# Patient Record
Sex: Female | Born: 1945
Health system: Southern US, Community
[De-identification: ages and names within clinical notes are randomized; demographics above are authoritative.]

## PROBLEM LIST (undated history)

## (undated) DIAGNOSIS — M1990 Unspecified osteoarthritis, unspecified site: Secondary | ICD-10-CM

## (undated) DIAGNOSIS — E7800 Pure hypercholesterolemia, unspecified: Secondary | ICD-10-CM

## (undated) DIAGNOSIS — J45909 Unspecified asthma, uncomplicated: Secondary | ICD-10-CM

## (undated) HISTORY — PX: BACK SURGERY: SHX140

## (undated) HISTORY — PX: ABDOMINAL HYSTERECTOMY: SHX81

---

## 1998-07-24 ENCOUNTER — Encounter: Payer: Self-pay | Admitting: Endocrinology

## 1998-07-24 ENCOUNTER — Ambulatory Visit (HOSPITAL_COMMUNITY): Admission: RE | Admit: 1998-07-24 | Discharge: 1998-07-24 | Payer: Self-pay | Admitting: Endocrinology

## 1999-07-30 ENCOUNTER — Encounter: Payer: Self-pay | Admitting: Endocrinology

## 1999-07-30 ENCOUNTER — Ambulatory Visit (HOSPITAL_COMMUNITY): Admission: RE | Admit: 1999-07-30 | Discharge: 1999-07-30 | Payer: Self-pay | Admitting: Endocrinology

## 2009-12-23 ENCOUNTER — Encounter (INDEPENDENT_AMBULATORY_CARE_PROVIDER_SITE_OTHER): Payer: Self-pay | Admitting: *Deleted

## 2009-12-24 ENCOUNTER — Encounter (INDEPENDENT_AMBULATORY_CARE_PROVIDER_SITE_OTHER): Payer: Self-pay

## 2009-12-25 ENCOUNTER — Encounter: Admission: RE | Admit: 2009-12-25 | Discharge: 2009-12-25 | Payer: Self-pay | Admitting: Internal Medicine

## 2009-12-29 ENCOUNTER — Ambulatory Visit: Payer: Self-pay | Admitting: Internal Medicine

## 2010-01-06 ENCOUNTER — Ambulatory Visit: Payer: Self-pay | Admitting: Internal Medicine

## 2010-09-07 NOTE — Letter (Signed)
Summary: Previsit letter  [facility]  [address] [place], [place] [address]   Phone: [phone]  Fax: [phone]       12/23/2009 MRN: [id number]  [facility] [place] [address] [place], [place]  [address]  Dear Ms. Hesch,  Welcome to the Gastroenterology Division at [facility].    You are scheduled to see a nurse for your pre-procedure visit on 12-29-09 at 10:00a.m. on the 3rd floor at [facility], [address]  We ask that you try to arrive at our office 15 minutes prior to your appointment time to allow for check-in.  Your nurse visit will consist of discussing your medical and surgical history, your immediate family medical history, and your medications.    Please bring a complete list of all your medications or, if you prefer, bring the medication bottles and we will list them.  We will need to be aware of both prescribed and over the counter drugs.  We will need to know exact dosage information as well.  If you are on blood thinners (Coumadin, Plavix, Aggrenox, Ticlid, etc.) please call our office today/prior to your appointment, as we need to consult with your physician about holding your medication.   Please be prepared to read and sign documents such as consent forms, a financial agreement, and acknowledgement forms.  If necessary, and with your consent, a friend or relative is welcome to sit-in on the nurse visit with you.  Please bring your insurance card so that we may make a copy of it.  If your insurance requires a referral to see a specialist, please bring your referral form from your primary care physician.  No co-pay is required for this nurse visit.     If you cannot keep your appointment, please call [phone] to cancel or reschedule prior to your appointment date.  This allows [place] the opportunity to schedule an appointment for another patient in need of care.    Thank you for choosing [facility] for your  medical needs.  We appreciate the opportunity to care for you.  Please visit [place] at our website  to learn more about our practice.                     Sincerely.                                                                                                                   The Gastroenterology Division

## 2010-09-07 NOTE — Miscellaneous (Signed)
Summary: direct colon--ch.  Clinical Lists Changes  Medications: Added new medication of MIRALAX   POWD (POLYETHYLENE GLYCOL 3350) As per prep  instructions. - Signed Added new medication of REGLAN 10 MG  TABS (METOCLOPRAMIDE HCL) As per prep instructions. - Signed Added new medication of DULCOLAX 5 MG  TBEC (BISACODYL) Day before procedure take 2 at 3pm and 2 at 8pm. - Signed Rx of MIRALAX   POWD (POLYETHYLENE GLYCOL 3350) As per prep  instructions.;  #255gm x 0;  Signed;  Entered by: Ulis Rias RN;  Authorized by: Hart Carwin MD;  Method used: Electronically to [facility] [id number]*, [address], [place], [place], [place]  [address], Ph: [phone], Fax: [phone] Rx of REGLAN 10 MG  TABS (METOCLOPRAMIDE HCL) As per prep instructions.;  #2 x 0;  Signed;  Entered by: Ulis Rias RN;  Authorized by: Hart Carwin MD;  Method used: Electronically to [facility] [id number]*, [address] [place], [place], [place]  [address], Ph: [phone], Fax: [phone] Rx of DULCOLAX 5 MG  TBEC (BISACODYL) Day before procedure take 2 at 3pm and 2 at 8pm.;  #4 x 0;  Signed;  Entered by: Ulis Rias RN;  Authorized by: Hart Carwin MD;  Method used: Electronically to [facility] [address][id number]*, [address], [place], [place], [place]  [address], Ph: [phone], Fax: [phone] Allergies: Added new allergy or adverse reaction of * ASPIRIN PRODUCTS Observations: Added new observation of NKA: F (12/29/2009 9:25)    Prescriptions: DULCOLAX 5 MG  TBEC (BISACODYL) Day before procedure take 2 at 3pm and 2 at 8pm.  #4 x 0   Entered by:   Ulis Rias RN   Authorized by:   Hart Carwin MD   Signed by:   Ulis Rias RN on 12/29/2009   Method used:   Electronically to        [facility] [id number]* (retail)       [address] [place]       [place], [place]  [address]       Ph:  [phone]       Fax: [phone]   RxID:   [id number] REGLAN 10 MG  TABS (METOCLOPRAMIDE HCL) As per prep instructions.  #2 x 0   Entered by:   Ulis Rias RN   Authorized by:   Hart Carwin MD   Signed by:   Ulis Rias RN on 12/29/2009   Method used:   Electronically to        [facility] [address]38* (retail)       [address] [place]       [place], [place]  [address]       Ph: [phone]       Fax: [phone]   RxID:   [id number] MIRALAX   POWD (POLYETHYLENE GLYCOL 3350) As per prep  instructions.  #255gm x 0   Entered by:   Ulis Rias RN   Authorized by:   Hart Carwin MD   Signed by:   Ulis Rias RN on 12/29/2009   Method used:   Electronically to        [facility] [address]* (retail)       [address]       [place]  [place], [place]  [address]       Ph: [phone]       Fax: [phone]   RxID:   [id number]

## 2010-09-07 NOTE — Procedures (Signed)
Summary: Colonoscopy  Patient: [name] Note: All result statuses are Final unless otherwise noted.  Tests: (1) Colonoscopy (COL)   COL Colonoscopy           DONE     [facility]     [address]     [place], [place]  [address]           COLONOSCOPY PROCEDURE REPORT           PATIENT:  Kristina Burns, Kristina Burns  MR#:  [id number]     BIRTHDATE:  1945-10-24, 63 yrs. old  GENDER:  female     ENDOSCOPIST:  Hedwig Morton. Juanda Chance, MD     REF. BY:  Guerry Bruin, M.D.     PROCEDURE DATE:  01/06/2010     PROCEDURE:  Colonoscopy 08657     ASA CLASS:  Class I     INDICATIONS:  Routine Risk Screening     MEDICATIONS:   Versed 7 mg, Fentanyl 75 mcg           DESCRIPTION OF PROCEDURE:   After the risks benefits and     alternatives of the procedure were thoroughly explained, informed     consent was obtained.  Digital rectal exam was performed and     revealed no rectal masses.   The LB CF-[id number] endoscope     was introduced through the anus and advanced to the cecum, which     was identified by both the appendix and ileocecal valve, without     limitations.  The quality of the prep was good, using MiraLax.     The instrument was then slowly withdrawn as the colon was fully     examined.     <<PROCEDUREIMAGES>>           FINDINGS:  Mild diverticulosis was found in the cecum (see     image2). several diverticuli in the cecal pouch  This was     otherwise a normal examination of the colon (see image1, image3,     image4, image5, and image6).   Retroflexed views in the rectum     revealed no abnormalities.    The scope was then withdrawn from     the patient and the procedure completed.           COMPLICATIONS:  None     ENDOSCOPIC IMPRESSION:     1) Mild diverticulosis in the cecum     2) Otherwise normal examination     RECOMMENDATIONS:     1) high fiber diet     REPEAT EXAM:  In 10 year(s) for.           ______________________________     Hedwig Morton. Juanda Chance, MD        CC:           n.     eSIGNED:   Hedwig Morton. Maziyah Vessel at 01/06/2010 11:53 AM           Sroka, Alen Blew, [id number]  Note: An exclamation mark (!) indicates a result that was not dispersed into the flowsheet. Document Creation Date: 01/06/2010 11:54 AM _______________________________________________________________________  (1) Order result status: Final Collection or observation date-time: 01/06/2010 11:48 Requested date-time:  Receipt date-time:  Reported date-time:  Referring Physician:   Ordering Physician: Lina Sar [phone]) Specimen Source:  Source: Launa Grill Order Number: [id number] Lab site:   Appended Document: Colonoscopy    Clinical Lists Changes  Observations: Added new observation of COLONNXTDUE: 01/2020 (01/06/2010 14:28)

## 2010-09-07 NOTE — Letter (Signed)
Summary: Miralax Instructions  [facility]  [address]   [place], [place] [address]   Phone: [phone]  Fax: [phone]       Kristina Burns    Jan 09, 1946    MRN: [id number]       Procedure Day /Date: Wednesday, 01-06-10     Arrival Time: 10:30 a.m.     Procedure Time: 11:30 a.m.     Location of Procedure:                    x   [facility] (4th Floor)    PREPARATION FOR COLONOSCOPY WITH MIRALAX  Starting 5 days prior to your procedure 01-01-10  do not eat nuts, seeds, popcorn, corn, beans, peas,  salads, or any raw vegetables.  Do not take any fiber supplements (e.g. Metamucil, Citrucel, and Benefiber). ____________________________________________________________________________________________________   THE DAY BEFORE YOUR PROCEDURE         DATE:  01-05-10  DAY: Tuesday  1   Drink clear liquids the entire day-NO SOLID FOOD  2   Do not drink anything colored red or purple.  Avoid juices with pulp.  No orange juice.  3   Drink at least 64 oz. (8 glasses) of fluid/clear liquids during the day to prevent dehydration and help the prep work efficiently.  CLEAR LIQUIDS INCLUDE: Water Jello Ice Popsicles Tea (sugar ok, no milk/cream) Powdered fruit flavored drinks Coffee (sugar ok, no milk/cream) Gatorade Juice: apple, white grape, white cranberry  Lemonade Clear bullion, consomm, broth Carbonated beverages (any kind) Strained chicken noodle soup Hard Candy  4   Mix the entire bottle of Miralax with 64 oz. of Gatorade/Powerade in the morning and put in the refrigerator to chill.  5   At 3:00 pm take 2 Dulcolax/Bisacodyl tablets.  6   At 4:30 pm take one Reglan/Metoclopramide tablet.  7  Starting at 5:00 pm drink one 8 oz glass of the Miralax mixture every 15-20 minutes until you have finished drinking the entire 64 oz.  You should finish drinking prep around 7:30 or 8:00 pm.  8   If you are nauseated, you may take the 2nd  Reglan/Metoclopramide tablet at 6:30 pm.        9    At 8:00 pm take 2 more DULCOLAX/Bisacodyl tablets.     THE DAY OF YOUR PROCEDURE      DATE:  01-06-10   DAY: Wednesday  You may drink clear liquids until  9:30 a.m.  (2 HOURS BEFORE PROCEDURE).   MEDICATION INSTRUCTIONS  Unless otherwise instructed, you should take regular prescription medications with a small sip of water as early as possible the morning of your procedure.         OTHER INSTRUCTIONS  You will need a responsible adult at least 63 years of age to accompany you and drive you home.   This person must remain in the waiting room during your procedure.  Wear loose fitting clothing that is easily removed.  Leave jewelry and other valuables at home.  However, you may wish to bring a book to read or an iPod/MP3 player to listen to music as you wait for your procedure to start.  Remove all body piercing jewelry and leave at home.  Total time from sign-in until discharge is approximately 2-3 hours.  You should go home directly after your procedure and rest.  You can resume normal activities the day after your procedure.  The day of your procedure you should not:  Drive   Make legal decisions   Operate machinery   Drink alcohol   Return to work  You will receive specific instructions about eating, activities and medications before you leave.   The above instructions have been reviewed and explained to me by   Ulis Rias RN  Dec 29, 2009 10:07 AM    I fully understand and can verbalize these instructions _____________________________ Date _______

## 2012-12-18 ENCOUNTER — Other Ambulatory Visit: Payer: Self-pay | Admitting: Internal Medicine

## 2012-12-18 DIAGNOSIS — Z1231 Encounter for screening mammogram for malignant neoplasm of breast: Secondary | ICD-10-CM

## 2013-01-04 ENCOUNTER — Ambulatory Visit
Admission: RE | Admit: 2013-01-04 | Discharge: 2013-01-04 | Disposition: A | Discharge status: Home / Self Care | Payer: PRIVATE HEALTH INSURANCE | Source: Ambulatory Visit | Attending: Internal Medicine | Admitting: Internal Medicine

## 2013-01-04 DIAGNOSIS — Z1231 Encounter for screening mammogram for malignant neoplasm of breast: Secondary | ICD-10-CM

## 2013-10-24 ENCOUNTER — Other Ambulatory Visit (HOSPITAL_COMMUNITY): Payer: Self-pay | Admitting: Internal Medicine

## 2013-10-24 DIAGNOSIS — J45909 Unspecified asthma, uncomplicated: Secondary | ICD-10-CM

## 2013-10-24 LAB — PULMONARY FUNCTION TEST
DL/VA % pred: 108 %
DL/VA: 4.91 ml/min/mmHg/L
DLCO unc % pred: 73 %
DLCO unc: 15.76 ml/min/mmHg
FEF 25-75 Post: 0.82 L/sec
FEF 25-75 Pre: 0.56 L/sec
FEF2575-%Change-Post: 46 %
FEF2575-%Pred-Post: 41 %
FEF2575-%Pred-Pre: 28 %
FEV1-%Change-Post: 12 %
FEV1-%Pred-Post: 60 %
FEV1-%Pred-Pre: 53 %
FEV1-Post: 1.33 L
FEV1-Pre: 1.18 L
FEV1FVC-%Change-Post: 1 %
FEV1FVC-%Pred-Pre: 74 %
FEV6-%Change-Post: 11 %
FEV6-%Pred-Post: 80 %
FEV6-%Pred-Pre: 72 %
FEV6-Post: 2.24 L
FEV6-Pre: 2.01 L
FEV6FVC-%Change-Post: 0 %
FEV6FVC-%Pred-Post: 102 %
FEV6FVC-%Pred-Pre: 101 %
FVC-%Change-Post: 11 %
FVC-%Pred-Post: 78 %
FVC-%Pred-Pre: 70 %
FVC-Post: 2.28 L
FVC-Pre: 2.05 L
Post FEV1/FVC ratio: 58 %
Post FEV6/FVC ratio: 98 %
Pre FEV1/FVC ratio: 57 %
Pre FEV6/FVC Ratio: 98 %
RV % pred: 109 %
RV: 2.19 L
TLC % pred: 89 %
TLC: 4.23 L

## 2013-10-29 ENCOUNTER — Ambulatory Visit (HOSPITAL_COMMUNITY)
Admission: RE | Admit: 2013-10-29 | Discharge: 2013-10-29 | Disposition: A | Discharge status: Home / Self Care | Payer: PRIVATE HEALTH INSURANCE | Source: Ambulatory Visit | Attending: Internal Medicine | Admitting: Internal Medicine

## 2013-10-29 DIAGNOSIS — J45909 Unspecified asthma, uncomplicated: Secondary | ICD-10-CM | POA: Insufficient documentation

## 2013-10-29 MED ORDER — ALBUTEROL SULFATE (2.5 MG/3ML) 0.083% IN NEBU
2.5000 mg | INHALATION_SOLUTION | Freq: Once | RESPIRATORY_TRACT | Status: AC
  Administered 2013-10-29: 2.5 mg via RESPIRATORY_TRACT

## 2015-09-07 ENCOUNTER — Encounter: Payer: Self-pay | Admitting: Internal Medicine

## 2016-01-05 DIAGNOSIS — L280 Lichen simplex chronicus: Secondary | ICD-10-CM | POA: Diagnosis not present

## 2016-01-05 DIAGNOSIS — L281 Prurigo nodularis: Secondary | ICD-10-CM | POA: Diagnosis not present

## 2016-01-05 DIAGNOSIS — L300 Nummular dermatitis: Secondary | ICD-10-CM | POA: Diagnosis not present

## 2016-01-11 DIAGNOSIS — Z79899 Other long term (current) drug therapy: Secondary | ICD-10-CM | POA: Diagnosis not present

## 2016-01-11 DIAGNOSIS — N390 Urinary tract infection, site not specified: Secondary | ICD-10-CM | POA: Diagnosis not present

## 2016-01-11 DIAGNOSIS — E7800 Pure hypercholesterolemia, unspecified: Secondary | ICD-10-CM | POA: Diagnosis not present

## 2016-01-11 DIAGNOSIS — E559 Vitamin D deficiency, unspecified: Secondary | ICD-10-CM | POA: Diagnosis not present

## 2016-01-11 DIAGNOSIS — R8290 Unspecified abnormal findings in urine: Secondary | ICD-10-CM | POA: Diagnosis not present

## 2016-01-20 DIAGNOSIS — E559 Vitamin D deficiency, unspecified: Secondary | ICD-10-CM | POA: Diagnosis not present

## 2016-01-20 DIAGNOSIS — Z6834 Body mass index (BMI) 34.0-34.9, adult: Secondary | ICD-10-CM | POA: Diagnosis not present

## 2016-01-20 DIAGNOSIS — J4540 Moderate persistent asthma, uncomplicated: Secondary | ICD-10-CM | POA: Diagnosis not present

## 2016-01-20 DIAGNOSIS — M859 Disorder of bone density and structure, unspecified: Secondary | ICD-10-CM | POA: Diagnosis not present

## 2016-01-20 DIAGNOSIS — M5416 Radiculopathy, lumbar region: Secondary | ICD-10-CM | POA: Diagnosis not present

## 2016-01-20 DIAGNOSIS — M179 Osteoarthritis of knee, unspecified: Secondary | ICD-10-CM | POA: Diagnosis not present

## 2016-01-20 DIAGNOSIS — E668 Other obesity: Secondary | ICD-10-CM | POA: Diagnosis not present

## 2016-01-20 DIAGNOSIS — Z0000 Encounter for general adult medical examination without abnormal findings: Secondary | ICD-10-CM | POA: Diagnosis not present

## 2016-01-20 DIAGNOSIS — E7800 Pure hypercholesterolemia, unspecified: Secondary | ICD-10-CM | POA: Diagnosis not present

## 2016-01-20 DIAGNOSIS — J301 Allergic rhinitis due to pollen: Secondary | ICD-10-CM | POA: Diagnosis not present

## 2016-01-26 DIAGNOSIS — L300 Nummular dermatitis: Secondary | ICD-10-CM | POA: Diagnosis not present

## 2016-01-26 DIAGNOSIS — L280 Lichen simplex chronicus: Secondary | ICD-10-CM | POA: Diagnosis not present

## 2017-01-05 DIAGNOSIS — Z6835 Body mass index (BMI) 35.0-35.9, adult: Secondary | ICD-10-CM | POA: Diagnosis not present

## 2017-01-05 DIAGNOSIS — E668 Other obesity: Secondary | ICD-10-CM | POA: Diagnosis not present

## 2017-01-05 DIAGNOSIS — M1711 Unilateral primary osteoarthritis, right knee: Secondary | ICD-10-CM | POA: Diagnosis not present

## 2017-01-17 DIAGNOSIS — E668 Other obesity: Secondary | ICD-10-CM | POA: Diagnosis not present

## 2017-01-17 DIAGNOSIS — E559 Vitamin D deficiency, unspecified: Secondary | ICD-10-CM | POA: Diagnosis not present

## 2017-01-17 DIAGNOSIS — E7800 Pure hypercholesterolemia, unspecified: Secondary | ICD-10-CM | POA: Diagnosis not present

## 2017-01-17 DIAGNOSIS — M859 Disorder of bone density and structure, unspecified: Secondary | ICD-10-CM | POA: Diagnosis not present

## 2017-01-24 ENCOUNTER — Other Ambulatory Visit: Payer: Self-pay | Admitting: Internal Medicine

## 2017-01-24 DIAGNOSIS — J301 Allergic rhinitis due to pollen: Secondary | ICD-10-CM | POA: Diagnosis not present

## 2017-01-24 DIAGNOSIS — M542 Cervicalgia: Secondary | ICD-10-CM | POA: Diagnosis not present

## 2017-01-24 DIAGNOSIS — Z1231 Encounter for screening mammogram for malignant neoplasm of breast: Secondary | ICD-10-CM

## 2017-01-24 DIAGNOSIS — Z0000 Encounter for general adult medical examination without abnormal findings: Secondary | ICD-10-CM | POA: Diagnosis not present

## 2017-01-24 DIAGNOSIS — M179 Osteoarthritis of knee, unspecified: Secondary | ICD-10-CM | POA: Diagnosis not present

## 2017-02-03 ENCOUNTER — Ambulatory Visit
Admission: RE | Admit: 2017-02-03 | Discharge: 2017-02-03 | Disposition: A | Discharge status: Home / Self Care | Payer: Medicare Other | Source: Ambulatory Visit | Attending: Internal Medicine | Admitting: Internal Medicine

## 2017-02-03 DIAGNOSIS — Z1231 Encounter for screening mammogram for malignant neoplasm of breast: Secondary | ICD-10-CM | POA: Diagnosis not present

## 2017-05-11 DIAGNOSIS — Z23 Encounter for immunization: Secondary | ICD-10-CM | POA: Diagnosis not present

## 2017-06-22 DIAGNOSIS — M1711 Unilateral primary osteoarthritis, right knee: Secondary | ICD-10-CM | POA: Diagnosis not present

## 2017-06-22 DIAGNOSIS — Z6834 Body mass index (BMI) 34.0-34.9, adult: Secondary | ICD-10-CM | POA: Diagnosis not present

## 2017-07-04 ENCOUNTER — Ambulatory Visit (INDEPENDENT_AMBULATORY_CARE_PROVIDER_SITE_OTHER): Payer: Self-pay | Admitting: Orthopaedic Surgery

## 2017-07-10 ENCOUNTER — Ambulatory Visit (INDEPENDENT_AMBULATORY_CARE_PROVIDER_SITE_OTHER): Payer: Medicare Other

## 2017-07-10 ENCOUNTER — Encounter (INDEPENDENT_AMBULATORY_CARE_PROVIDER_SITE_OTHER): Payer: Self-pay | Admitting: Orthopaedic Surgery

## 2017-07-10 ENCOUNTER — Ambulatory Visit (INDEPENDENT_AMBULATORY_CARE_PROVIDER_SITE_OTHER): Payer: Medicare Other | Admitting: Orthopaedic Surgery

## 2017-07-10 DIAGNOSIS — M1712 Unilateral primary osteoarthritis, left knee: Secondary | ICD-10-CM | POA: Diagnosis not present

## 2017-07-10 DIAGNOSIS — M1711 Unilateral primary osteoarthritis, right knee: Secondary | ICD-10-CM | POA: Insufficient documentation

## 2017-07-10 DIAGNOSIS — G8929 Other chronic pain: Secondary | ICD-10-CM

## 2017-07-10 DIAGNOSIS — M25562 Pain in left knee: Secondary | ICD-10-CM

## 2017-07-10 DIAGNOSIS — M25561 Pain in right knee: Secondary | ICD-10-CM

## 2017-07-10 MED ORDER — METHYLPREDNISOLONE ACETATE 40 MG/ML IJ SUSP
40.0000 mg | INTRAMUSCULAR | Status: AC | PRN
  Administered 2017-07-10: 40 mg via INTRA_ARTICULAR

## 2017-07-10 MED ORDER — LIDOCAINE HCL 1 % IJ SOLN
3.0000 mL | INTRAMUSCULAR | Status: AC | PRN
  Administered 2017-07-10: 3 mL

## 2017-07-10 NOTE — Progress Notes (Signed)
Office Visit Note   Patient: Kristina Burns           Date of Birth: 1945-12-16           MRN: [id number] Visit Date: 07/10/2017              Requested by: Haywood Pao, MD [address] [place], [place] [address] PCP: System, Pcp Not In   Assessment & Plan: Visit Diagnoses:  1. Chronic pain of both knees   2. Unilateral primary osteoarthritis, left knee   3. Unilateral primary osteoarthritis, right knee     Plan: I do feel that it is worth trying steroid injections in both knees today and then ordering hyaluronic acid for her knees.  I gave her handout on this.  We had a long and thorough discussion about this being a knee issue.  There may be a back component as well and we can always workup her lumbar spine but I feel that this may be more of a knee issue.  We talked about the risk and benefits of steroid injections and she tolerated them well.  We will see her back in 4-5 weeks for hyaluronic acid injections in both knees.  All questions and concerns were answered and addressed.  Follow-Up Instructions: Return in about 4 weeks (around 08/07/2017).   Orders:  Orders Placed This Encounter  Procedures  . Large Joint Inj  . Large Joint Inj  . XR Knee 1-2 Views Left  . XR Knee 1-2 Views Right   No orders of the defined types were placed in this encounter.     Procedures: Large Joint Inj: R knee on 07/10/2017 9:24 AM Indications: diagnostic evaluation and pain Details: 22 G 1.5 in needle, superolateral approach  Arthrogram: No  Medications: 3 mL lidocaine 1 %; 40 mg methylPREDNISolone acetate 40 MG/ML Outcome: tolerated well, no immediate complications Procedure, treatment alternatives, risks and benefits explained, specific risks discussed. Consent was given by the patient. Immediately prior to procedure a time out was called to verify the correct patient, procedure, equipment, support staff and site/side marked as required. Patient was prepped and draped in the  usual sterile fashion.   Large Joint Inj: L knee on 07/10/2017 9:24 AM Indications: diagnostic evaluation and pain Details: 22 G 1.5 in needle, superolateral approach  Arthrogram: No  Medications: 3 mL lidocaine 1 %; 40 mg methylPREDNISolone acetate 40 MG/ML Outcome: tolerated well, no immediate complications Procedure, treatment alternatives, risks and benefits explained, specific risks discussed. Consent was given by the patient. Immediately prior to procedure a time out was called to verify the correct patient, procedure, equipment, support staff and site/side marked as required. Patient was prepped and draped in the usual sterile fashion.       Clinical Data: No additional findings.   Subjective: No chief complaint on file. The patient is a very pleasant 71 year old female who is a patient of Dr. Osborne Casco of [facility] [employer] [facility] [employer].  She is sent to need to evaluate bilateral knee and leg pain.  She has a remote history of spine surgery with a fusion of the lumbar spine.  She also has a remote history of a right knee arthroscopy.  She says it mainly hurts with activities.  She is been sitting for long period time she gets pain in her legs and her shins.  She denies any calf pain.  She denies any locking catching in her knees and denies swelling.  She says she has been taking Lyrica for a  long period time and this is helped quite a bit.  HPI  Review of Systems She currently denies any headache, chest pain, shortness of breath, fever, chills, nausea, vomiting.  Objective: Vital Signs: There were no vitals taken for this visit.  Physical Exam She is alert and oriented x3 and in no acute distress.  She does not walk with an assistive device. Ortho Exam Examination of both legs shows small pleural areas in terms of lesions where she scratched her skin.  She does have allergies and is been worked up by dermatologist and allergy specialist.  Both knees have no effusion.  Both knees  have significant patellofemoral crepitation.  Both knees have full range of motion and are ligamentously stable. Specialty Comments:  No specialty comments available.  Imaging: Xr Knee 1-2 Views Left  Result Date: 07/10/2017 2 views of left knee show no acute findings other than mild to moderate arthritic changes in all 3 compartments.  Xr Knee 1-2 Views Right  Result Date: 07/10/2017 2 views of the right knee showed no acute findings other than mild to moderate arthritic changes in all 3 compartments.    PMFS History: Patient Active Problem List   Diagnosis Date Noted  . Chronic pain of both knees 07/10/2017  . Unilateral primary osteoarthritis, left knee 07/10/2017  . Unilateral primary osteoarthritis, right knee 07/10/2017   History reviewed. No pertinent past medical history.  Family History  Problem Relation Age of Onset  . Breast cancer Neg Hx     History reviewed. No pertinent surgical history. Social History   Occupational History  . Not on file  Tobacco Use  . Smoking status: Not on file  Substance and Sexual Activity  . Alcohol use: Not on file  . Drug use: Not on file  . Sexual activity: Not on file

## 2017-09-25 ENCOUNTER — Ambulatory Visit (INDEPENDENT_AMBULATORY_CARE_PROVIDER_SITE_OTHER): Payer: Medicare Other | Admitting: Orthopaedic Surgery

## 2017-09-25 DIAGNOSIS — M1712 Unilateral primary osteoarthritis, left knee: Secondary | ICD-10-CM

## 2017-09-25 DIAGNOSIS — M1711 Unilateral primary osteoarthritis, right knee: Secondary | ICD-10-CM | POA: Diagnosis not present

## 2017-09-25 NOTE — Progress Notes (Addendum)
   Procedure Note  Patient: Kristina Burns             Date of Birth: 23-Jan-1946           MRN: [id number]             Visit Date: 09/25/2017  Procedures: Visit Diagnoses: Unilateral primary osteoarthritis, right knee  Unilateral primary osteoarthritis, left knee  Large Joint Inj: bilateral knee on 09/25/2017 9:40 AM Indications: pain and diagnostic evaluation Details: 22 G 1.5 in needle, superolateral approach  Arthrogram: No  Medications (Right): 88 mg Hyaluronan 88 MG/4ML Medications (Left): 88 mg Hyaluronan 88 MG/4ML Outcome: tolerated well, no immediate complications Procedure, treatment alternatives, risks and benefits explained, specific risks discussed. Consent was given by the patient. Immediately prior to procedure a time out was called to verify the correct patient, procedure, equipment, support staff and site/side marked as required. Patient was prepped and draped in the usual sterile fashion.    The patient had Monovisc injected in both knees today to treat pain from moderate osteoarthritis.

## 2017-09-27 MED ORDER — HYALURONAN 88 MG/4ML IX SOSY
88.0000 mg | PREFILLED_SYRINGE | INTRA_ARTICULAR | Status: AC | PRN
  Administered 2017-09-25: 88 mg via INTRA_ARTICULAR

## 2017-09-27 NOTE — Addendum Note (Signed)
Addended by: Jean Rosenthal on: 09/27/2017 08:31 AM   Modules accepted: Level of Service

## 2018-01-19 DIAGNOSIS — R82998 Other abnormal findings in urine: Secondary | ICD-10-CM | POA: Diagnosis not present

## 2018-01-19 DIAGNOSIS — E7800 Pure hypercholesterolemia, unspecified: Secondary | ICD-10-CM | POA: Diagnosis not present

## 2018-01-19 DIAGNOSIS — E559 Vitamin D deficiency, unspecified: Secondary | ICD-10-CM | POA: Diagnosis not present

## 2018-01-26 DIAGNOSIS — E7800 Pure hypercholesterolemia, unspecified: Secondary | ICD-10-CM | POA: Diagnosis not present

## 2018-01-26 DIAGNOSIS — Z0000 Encounter for general adult medical examination without abnormal findings: Secondary | ICD-10-CM | POA: Diagnosis not present

## 2018-01-26 DIAGNOSIS — J301 Allergic rhinitis due to pollen: Secondary | ICD-10-CM | POA: Diagnosis not present

## 2018-01-26 DIAGNOSIS — E668 Other obesity: Secondary | ICD-10-CM | POA: Diagnosis not present

## 2018-01-30 DIAGNOSIS — Z1212 Encounter for screening for malignant neoplasm of rectum: Secondary | ICD-10-CM | POA: Diagnosis not present

## 2018-03-14 ENCOUNTER — Ambulatory Visit (HOSPITAL_COMMUNITY)
Admission: RE | Admit: 2018-03-14 | Discharge: 2018-03-14 | Disposition: A | Discharge status: Home / Self Care | Payer: Medicare Other | Source: Ambulatory Visit | Attending: Internal Medicine | Admitting: Internal Medicine

## 2018-03-14 ENCOUNTER — Other Ambulatory Visit (HOSPITAL_COMMUNITY): Payer: Self-pay | Admitting: Internal Medicine

## 2018-03-14 DIAGNOSIS — Z6836 Body mass index (BMI) 36.0-36.9, adult: Secondary | ICD-10-CM | POA: Diagnosis not present

## 2018-03-14 DIAGNOSIS — M79605 Pain in left leg: Secondary | ICD-10-CM | POA: Diagnosis not present

## 2018-03-14 DIAGNOSIS — M79606 Pain in leg, unspecified: Secondary | ICD-10-CM | POA: Insufficient documentation

## 2018-03-14 DIAGNOSIS — M179 Osteoarthritis of knee, unspecified: Secondary | ICD-10-CM | POA: Diagnosis not present

## 2018-03-19 ENCOUNTER — Ambulatory Visit (INDEPENDENT_AMBULATORY_CARE_PROVIDER_SITE_OTHER): Payer: Medicare Other | Admitting: Physician Assistant

## 2018-03-19 ENCOUNTER — Encounter (INDEPENDENT_AMBULATORY_CARE_PROVIDER_SITE_OTHER): Payer: Self-pay | Admitting: Physician Assistant

## 2018-03-19 ENCOUNTER — Ambulatory Visit (INDEPENDENT_AMBULATORY_CARE_PROVIDER_SITE_OTHER): Payer: Medicare Other

## 2018-03-19 DIAGNOSIS — M1712 Unilateral primary osteoarthritis, left knee: Secondary | ICD-10-CM

## 2018-03-19 DIAGNOSIS — M25562 Pain in left knee: Secondary | ICD-10-CM | POA: Diagnosis not present

## 2018-03-19 DIAGNOSIS — G8929 Other chronic pain: Secondary | ICD-10-CM

## 2018-03-19 MED ORDER — METHYLPREDNISOLONE ACETATE 40 MG/ML IJ SUSP
40.0000 mg | INTRAMUSCULAR | Status: AC | PRN
  Administered 2018-03-19: 40 mg via INTRA_ARTICULAR

## 2018-03-19 MED ORDER — LIDOCAINE HCL 1 % IJ SOLN
3.0000 mL | INTRAMUSCULAR | Status: AC | PRN
  Administered 2018-03-19: 3 mL

## 2018-03-19 NOTE — Progress Notes (Signed)
Office Visit Note   Patient: Kristina Burns           Date of Birth: 1946/07/29           MRN: [id number] Visit Date: 03/19/2018              Requested by: No referring provider defined for this encounter. PCP: System, Pcp Not In   Assessment & Plan: Visit Diagnoses:  1. Chronic pain of left knee     Plan: She will work on Forensic scientist.  Also discussed knee friendly exercises with her.  She will follow-up with [place] on as-needed basis or as frequent as every 3 months for cortisone injections.  Questions were encouraged and answered at length.  Follow-Up Instructions: Return if symptoms worsen or fail to improve.   Orders:  Orders Placed This Encounter  Procedures  . XR Knee 1-2 Views Left   No orders of the defined types were placed in this encounter.     Procedures: Large Joint Inj on 03/19/2018 8:51 AM Indications: pain Details: 22 G 1.5 in needle, anterolateral approach  Arthrogram: No  Medications: 3 mL lidocaine 1 %; 40 mg methylPREDNISolone acetate 40 MG/ML Outcome: tolerated well, no immediate complications Procedure, treatment alternatives, risks and benefits explained, specific risks discussed. Consent was given by the patient. Immediately prior to procedure a time out was called to verify the correct patient, procedure, equipment, support staff and site/side marked as required. Patient was prepped and draped in the usual sterile fashion.       Clinical Data: No additional findings.   Subjective: Chief Complaint  Patient presents with  . Left Leg - Pain    HPI Kristina Burns is a 72 year old female well-known to Dr. Ninfa Linden service comes in today due to worsening left knee pain.  She is had no particular injury.  She did have supplemental injection of the knee that they gave her no relief.  She is having increased pain medial aspect of the left knee.  No mechanical symptoms. Review of Systems Please see HPI otherwise negative  Objective: Vital  Signs: There were no vitals taken for this visit.  Physical Exam  Constitutional: She is oriented to person, place, and time. She appears well-developed and well-nourished. No distress.  Pulmonary/Chest: Effort normal.  Neurological: She is alert and oriented to person, place, and time.  Skin: She is not diaphoretic.    Ortho Exam Left knee no effusion abnormal warmth.  No instability valgus varus stressing.  She has full extension and full flexion.  Tenderness along medial joint line.  Positive crepitus with passive range of motion. Specialty Comments:  No specialty comments available.  Imaging: Xr Knee 1-2 Views Left  Result Date: 03/19/2018 AP lateral views left knee: No acute fracture.  Compared to films in December 2018 medial compartmental arthritis is advanced and is now moderately severe, patellofemoral moderate mild to moderate lateral compartment.  Knee is well located.    PMFS History: Patient Active Problem List   Diagnosis Date Noted  . Chronic pain of both knees 07/10/2017  . Unilateral primary osteoarthritis, left knee 07/10/2017  . Unilateral primary osteoarthritis, right knee 07/10/2017   No past medical history on file.  Family History  Problem Relation Age of Onset  . Breast cancer Neg Hx     No past surgical history on file. Social History   Occupational History  . Not on file  Tobacco Use  . Smoking status: Not on file  Substance and Sexual  Activity  . Alcohol use: Not on file  . Drug use: Not on file  . Sexual activity: Not on file

## 2018-04-16 ENCOUNTER — Other Ambulatory Visit: Payer: Self-pay | Admitting: Internal Medicine

## 2018-04-16 DIAGNOSIS — Z1231 Encounter for screening mammogram for malignant neoplasm of breast: Secondary | ICD-10-CM

## 2018-05-17 ENCOUNTER — Ambulatory Visit
Admission: RE | Admit: 2018-05-17 | Discharge: 2018-05-17 | Disposition: A | Discharge status: Home / Self Care | Payer: Medicare Other | Source: Ambulatory Visit | Attending: Internal Medicine | Admitting: Internal Medicine

## 2018-05-17 DIAGNOSIS — Z1231 Encounter for screening mammogram for malignant neoplasm of breast: Secondary | ICD-10-CM | POA: Diagnosis not present

## 2018-07-18 DIAGNOSIS — Z23 Encounter for immunization: Secondary | ICD-10-CM | POA: Diagnosis not present

## 2018-08-06 ENCOUNTER — Ambulatory Visit (INDEPENDENT_AMBULATORY_CARE_PROVIDER_SITE_OTHER): Payer: Medicare Other | Admitting: Orthopaedic Surgery

## 2018-08-06 ENCOUNTER — Other Ambulatory Visit (INDEPENDENT_AMBULATORY_CARE_PROVIDER_SITE_OTHER): Payer: Self-pay | Admitting: Orthopaedic Surgery

## 2018-08-06 DIAGNOSIS — M1711 Unilateral primary osteoarthritis, right knee: Secondary | ICD-10-CM | POA: Diagnosis not present

## 2018-08-06 DIAGNOSIS — M1712 Unilateral primary osteoarthritis, left knee: Secondary | ICD-10-CM

## 2018-08-06 MED ORDER — ACETAMINOPHEN-CODEINE #3 300-30 MG PO TABS: 1.0000 | ORAL_TABLET | ORAL | 0 refills | Status: DC | PRN

## 2018-08-06 NOTE — Progress Notes (Signed)
Office Visit Note   Patient: Kristina Burns           Date of Birth: Aug 10, 1945           MRN: [id number] Visit Date: 08/06/2018              Requested by: No referring provider defined for this encounter. PCP: System, Pcp Not In   Assessment & Plan: Visit Diagnoses:  1. Unilateral primary osteoarthritis, right knee   2. Unilateral primary osteoarthritis, left knee     Plan: I did show her a knee replacement model and explained in detail what this involves.  We would proceed with the right knee first because radiographically this is slightly worse.  Her pain is equal on both sides but I do feel that we should only try one knee at first and she understands this fully.  I had a long and thorough discussion about surgery.  We went over x-rays.  We discussed the risk and benefits of surgery.  I talked her about her intraoperative and postoperative course and what to expect.  All questions concerns were answered and addressed.  We will work on getting her right knee scheduled in the near future.  Follow-Up Instructions: Return for 2 weeks post-op.   Orders:  No orders of the defined types were placed in this encounter.  No orders of the defined types were placed in this encounter.     Procedures: No procedures performed   Clinical Data: No additional findings.   Subjective: Chief Complaint  Patient presents with  . Left Knee - Pain  . Right Knee - Pain  The patient is very well-known to me.  She is an incredibly active 72 year old with debilitating arthritis involving both her knees.  They both hurt equally.  We have seen her for over a year for this.  She has had steroid injections and other injections in both knees.  She is work on activity modification and quad strengthening.  Her pain is daily and is become 10 out of 10.  Each knee hurts equally bad.  She is tried and failed all forms conservative treatment at this point.  She would like to consider knee replacement  surgery.  She is a very active individual.  She has not a diabetic.  She is not on any blood thinning medications.  It is gotten to where any of the injections are only helping minimally.  HPI  Review of Systems She currently denies any headache, chest pain, shortness of breath, fever, chills, nausea, vomiting.  Objective: Vital Signs: There were no vitals taken for this visit.  Physical Exam She is alert and oriented x3 and in no acute distress Ortho Exam Examination of both knees shows varus malalignment.  There is a mild effusion of both knees.  She has significant medial joint line tenderness bilaterally and patellofemoral crepitation with good range of motion. Specialty Comments:  No specialty comments available.  Imaging: No results found. X-rays of both knees previously in our system show severe tricompartmental arthritis of both knees with varus malalignment.  There is complete loss of medial joint space and severe patellofemoral arthritic changes.  There are para-articular osteophytes in all 3 compartments.  This is equal on both knees.  PMFS History: Patient Active Problem List   Diagnosis Date Noted  . Chronic pain of both knees 07/10/2017  . Unilateral primary osteoarthritis, left knee 07/10/2017  . Unilateral primary osteoarthritis, right knee 07/10/2017   No past medical history on  file.  Family History  Problem Relation Age of Onset  . Breast cancer Neg Hx     No past surgical history on file. Social History   Occupational History  . Not on file  Tobacco Use  . Smoking status: Not on file  Substance and Sexual Activity  . Alcohol use: Not on file  . Drug use: Not on file  . Sexual activity: Not on file

## 2018-08-10 NOTE — Pre-Procedure Instructions (Signed)
Kristina Burns  08/10/2018      [id number] - [place], [place] - [name] [place] [address] Phone: [phone] Fax: [phone]    Your procedure is scheduled on January 14  Report to [place] at 1240 A.M.  Call this number if you have problems the morning of surgery:  [phone]   Remember:  Do not eat or drink after midnight.      Take these medicines the morning of surgery with A SIP OF WATER  acetaminophen (TYLENOL)  fluticasone furoate-vilanterol (BREO ELLIPTA) loratadine (CLARITIN) pregabalin (LYRICA)   7 days prior to surgery STOP taking any Aspirin (unless otherwise instructed by your surgeon), Aleve, Naproxen, Ibuprofen, Motrin, Advil, Goody's, BC's, all herbal medications, fish oil, and all vitamins.     Do not wear jewelry, make-up or nail polish.  Do not wear lotions, powders, or perfumes, or deodorant.  Do not shave 48 hours prior to surgery.    Do not bring valuables to the hospital.  [facility] is not responsible for any belongings or valuables.  Contacts, dentures or bridgework may not be worn into surgery.  Leave your suitcase in the car.  After surgery it may be brought to your room.  For patients admitted to the hospital, discharge time will be determined by your treatment team.  Patients discharged the day of surgery will not be allowed to drive home.    Special instructions:   [place]- Preparing For Surgery  Before surgery, you can play an important role. Because skin is not sterile, your skin needs to be as free of germs as possible. You can reduce the number of germs on your skin by washing with CHG (chlorahexidine gluconate) Soap before surgery.  CHG is an antiseptic cleaner which kills germs and bonds with the skin to continue killing germs even after washing.    Oral Hygiene is also important to reduce your risk of infection.   Remember - BRUSH YOUR TEETH THE MORNING OF SURGERY WITH YOUR REGULAR TOOTHPASTE  Please do not use if you have an allergy to CHG or antibacterial soaps. If your skin becomes reddened/irritated stop using the CHG.  Do not shave (including legs and underarms) for at least 48 hours prior to first CHG shower. It is OK to shave your face.  Please follow these instructions carefully.   1. Shower the NIGHT BEFORE SURGERY and the MORNING OF SURGERY with CHG.   2. If you chose to wash your hair, wash your hair first as usual with your normal shampoo.  3. After you shampoo, rinse your hair and body thoroughly to remove the shampoo.  4. Use CHG as you would any other liquid soap. You can apply CHG directly to the skin and wash gently with a scrungie or a clean washcloth.   5. Apply the CHG Soap to your body ONLY FROM THE NECK DOWN.  Do not use on open wounds or open sores. Avoid contact with your eyes, ears, mouth and genitals (private parts). Wash Face and genitals (private parts)  with your normal soap.  6. Wash thoroughly, paying special attention to the area where your surgery will be performed.  7. Thoroughly rinse your body with warm water from the neck down.  8. DO NOT shower/wash with your normal soap after using and rinsing off the CHG Soap.  9. Pat yourself dry with a  CLEAN TOWEL.  10. Wear CLEAN PAJAMAS to bed the night before surgery, wear comfortable clothes the morning of surgery  11. Place CLEAN SHEETS on your bed the night of your first shower and DO NOT SLEEP WITH PETS.    Day of Surgery:  Do not apply any deodorants/lotions.  Please wear clean clothes to the hospital/surgery center.   Remember to brush your teeth WITH YOUR REGULAR TOOTHPASTE.    Please read over the following fact sheets that you were given.

## 2018-08-13 ENCOUNTER — Other Ambulatory Visit (INDEPENDENT_AMBULATORY_CARE_PROVIDER_SITE_OTHER): Payer: Self-pay | Admitting: Physician Assistant

## 2018-08-13 ENCOUNTER — Encounter (HOSPITAL_COMMUNITY)
Admission: RE | Admit: 2018-08-13 | Discharge: 2018-08-13 | Disposition: A | Discharge status: Home / Self Care | Payer: Medicare Other | Source: Ambulatory Visit | Attending: Orthopaedic Surgery | Admitting: Orthopaedic Surgery

## 2018-08-13 ENCOUNTER — Encounter (HOSPITAL_COMMUNITY): Payer: Self-pay

## 2018-08-13 ENCOUNTER — Other Ambulatory Visit: Payer: Self-pay

## 2018-08-13 DIAGNOSIS — Z01812 Encounter for preprocedural laboratory examination: Secondary | ICD-10-CM | POA: Insufficient documentation

## 2018-08-13 HISTORY — DX: Unspecified osteoarthritis, unspecified site: M19.90

## 2018-08-13 HISTORY — DX: Unspecified asthma, uncomplicated: J45.909

## 2018-08-13 HISTORY — DX: Pure hypercholesterolemia, unspecified: E78.00

## 2018-08-13 LAB — CBC
HCT: 42.3 % (ref 36.0–46.0)
Hemoglobin: 13.7 g/dL (ref 12.0–15.0)
MCH: 29.8 pg (ref 26.0–34.0)
MCHC: 32.4 g/dL (ref 30.0–36.0)
MCV: 92.2 fL (ref 80.0–100.0)
Platelets: 214 10*3/uL (ref 150–400)
RBC: 4.59 MIL/uL (ref 3.87–5.11)
RDW: 12.7 % (ref 11.5–15.5)
WBC: 5.9 10*3/uL (ref 4.0–10.5)
nRBC: 0 % (ref 0.0–0.2)

## 2018-08-13 LAB — BASIC METABOLIC PANEL
Anion gap: 10 (ref 5–15)
BUN: 12 mg/dL (ref 8–23)
CO2: 24 mmol/L (ref 22–32)
Calcium: 9.9 mg/dL (ref 8.9–10.3)
Chloride: 103 mmol/L (ref 98–111)
Creatinine, Ser: 0.67 mg/dL (ref 0.44–1.00)
GFR calc Af Amer: >60 mL/min (ref >60)
GFR calc non Af Amer: >60 mL/min (ref >60)
Glucose, Bld: 107 mg/dL — ABNORMAL HIGH (ref 70–99)
Potassium: 3.8 mmol/L (ref 3.5–5.1)
Sodium: 137 mmol/L (ref 135–145)

## 2018-08-13 LAB — SURGICAL PCR SCREEN
MRSA, PCR: NEGATIVE
Staphylococcus aureus: NEGATIVE

## 2018-08-13 NOTE — Pre-Procedure Instructions (Signed)
Kristina Burns  08/13/2018      [id number] - [place], [place] - [address] [place] [address] Phone: [phone] Fax: [phone]    Your procedure is scheduled on Tuesday, January 14   Report to [facility] Admitting at 1240 pm             (posted surgery time 2:30p - 4:48p)   Call this number if you have problems the morning of surgery:  [phone]   Remember:  Do not eat any foods or drink any liquids after midnight, Monday.      Take these medicines the morning of surgery with A SIP OF WATER  acetaminophen (TYLENOL)  fluticasone furoate-vilanterol (BREO ELLIPTA) loratadine (CLARITIN) pregabalin (LYRICA)   7 days prior to surgery STOP taking any Aspirin (unless otherwise instructed by your surgeon), Aleve, Naproxen, Ibuprofen, Motrin, Advil, Goody's, BC's, all herbal medications, fish oil, and all vitamins.     Do not wear jewelry, make-up or nail polish.  Do not wear lotions, powders,  perfumes, or deodorant.  Do not shave 48 hours prior to surgery.    Do not bring valuables to the hospital.  [facility] is not responsible for any belongings or valuables.  Contacts, dentures or bridgework may not be worn into surgery.  Leave your suitcase in the car.  After surgery it may be brought to your room.  For patients admitted to the hospital, discharge time will be determined by your treatment team.  Patients discharged the day of surgery will not be allowed to drive home.     Special instructions:   [place]- Preparing For Surgery  Before surgery, you can play an important role. Because skin is not sterile, your skin needs to be as free of germs as possible. You can reduce the number of germs on your skin by washing with CHG (chlorahexidine gluconate) Soap before surgery.  CHG is an antiseptic cleaner which kills germs and bonds with the skin to continue killing germs  even after washing.    Oral Hygiene is also important to reduce your risk of infection.    Remember - BRUSH YOUR TEETH THE MORNING OF SURGERY WITH YOUR REGULAR TOOTHPASTE  Please do not use if you have an allergy to CHG or antibacterial soaps. If your skin becomes reddened/irritated stop using the CHG.  Do not shave (including legs and underarms) for at least 48 hours prior to first CHG shower. It is OK to shave your face.  Please follow these instructions carefully.   1. Shower the NIGHT BEFORE SURGERY and the MORNING OF SURGERY with CHG.   2. If you chose to wash your hair, wash your hair first as usual with your normal shampoo.  3. After you shampoo, rinse your hair and body thoroughly to remove the shampoo.  4. Use CHG as you would any other liquid soap. You can apply CHG directly to the skin and wash gently with a scrungie or a clean washcloth.   5. Apply the CHG Soap to your body ONLY FROM THE NECK DOWN.  Do not use on open wounds or open sores. Avoid contact with your eyes, ears, mouth and genitals (private parts). Wash Face and genitals (private parts)  with your normal soap.  6. Wash thoroughly, paying special attention to the area where your surgery will be performed.  7. Thoroughly rinse your body with warm  water from the neck down.  8. DO NOT shower/wash with your normal soap after using and rinsing off the CHG Soap.  9. Pat yourself dry with a CLEAN TOWEL.  10. Wear CLEAN PAJAMAS to bed the night before surgery, wear comfortable clothes the morning of surgery  11. Place CLEAN SHEETS on your bed the night of your first shower and DO NOT SLEEP WITH PETS.    Day of Surgery:  Do not apply any deodorants/lotions.  Please wear clean clothes to the hospital/surgery center.   Remember to brush your teeth WITH YOUR REGULAR TOOTHPASTE.    Please read over the following fact sheets that you were given.

## 2018-08-13 NOTE — Progress Notes (Addendum)
PCP  Dr. Alfonso Patten Tisovec    LOV 07/2018 Denies murmur, cp, sob. Does have asthma - controlled No orders at PAT appt, message was sent.

## 2018-08-15 ENCOUNTER — Other Ambulatory Visit (INDEPENDENT_AMBULATORY_CARE_PROVIDER_SITE_OTHER): Payer: Self-pay | Admitting: Physician Assistant

## 2018-08-20 MED ORDER — TRANEXAMIC ACID-NACL 1000-0.7 MG/100ML-% IV SOLN
1000.0000 mg | INTRAVENOUS | Status: AC
  Administered 2018-08-21: 1000 mg via INTRAVENOUS
  Filled 2018-08-20: qty 100

## 2018-08-21 ENCOUNTER — Ambulatory Visit (HOSPITAL_COMMUNITY): Payer: Medicare Other | Admitting: Certified Registered"

## 2018-08-21 ENCOUNTER — Observation Stay (HOSPITAL_COMMUNITY)
Admission: RE | Admit: 2018-08-21 | Discharge: 2018-08-23 | Disposition: A | Discharge status: Home / Self Care | Payer: Medicare Other | Attending: Orthopaedic Surgery | Admitting: Orthopaedic Surgery

## 2018-08-21 ENCOUNTER — Encounter (HOSPITAL_COMMUNITY)
Admission: RE | Disposition: A | Discharge status: Home / Self Care | Payer: Self-pay | Source: Home / Self Care | Attending: Orthopaedic Surgery

## 2018-08-21 ENCOUNTER — Inpatient Hospital Stay (HOSPITAL_COMMUNITY): Payer: Medicare Other

## 2018-08-21 DIAGNOSIS — Z79899 Other long term (current) drug therapy: Secondary | ICD-10-CM | POA: Insufficient documentation

## 2018-08-21 DIAGNOSIS — E7800 Pure hypercholesterolemia, unspecified: Secondary | ICD-10-CM | POA: Diagnosis not present

## 2018-08-21 DIAGNOSIS — M21371 Foot drop, right foot: Secondary | ICD-10-CM | POA: Diagnosis not present

## 2018-08-21 DIAGNOSIS — M1711 Unilateral primary osteoarthritis, right knee: Principal | ICD-10-CM

## 2018-08-21 DIAGNOSIS — G8918 Other acute postprocedural pain: Secondary | ICD-10-CM | POA: Diagnosis not present

## 2018-08-21 DIAGNOSIS — G8929 Other chronic pain: Secondary | ICD-10-CM | POA: Diagnosis not present

## 2018-08-21 DIAGNOSIS — Z96651 Presence of right artificial knee joint: Secondary | ICD-10-CM

## 2018-08-21 HISTORY — PX: TOTAL KNEE ARTHROPLASTY: SHX125

## 2018-08-21 SURGERY — ARTHROPLASTY, KNEE, TOTAL
Anesthesia: Monitor Anesthesia Care | Site: Knee | Laterality: Right

## 2018-08-21 MED ORDER — ACETAMINOPHEN 325 MG PO TABS
325.0000 mg | ORAL_TABLET | Freq: Four times a day (QID) | ORAL | Status: DC | PRN
  Administered 2018-08-22: 650 mg via ORAL
  Filled 2018-08-21: qty 2

## 2018-08-21 MED ORDER — SODIUM CHLORIDE 0.9 % IV SOLN
INTRAVENOUS | Status: DC
  Administered 2018-08-21: 23:00:00 via INTRAVENOUS

## 2018-08-21 MED ORDER — METHOCARBAMOL 500 MG PO TABS
ORAL_TABLET | ORAL | Status: AC
  Filled 2018-08-21: qty 1

## 2018-08-21 MED ORDER — METOCLOPRAMIDE HCL 5 MG PO TABS: 5.0000 mg | ORAL_TABLET | Freq: Three times a day (TID) | ORAL | Status: DC | PRN

## 2018-08-21 MED ORDER — METHOCARBAMOL 1000 MG/10ML IJ SOLN
500.0000 mg | Freq: Four times a day (QID) | INTRAVENOUS | Status: DC | PRN
  Filled 2018-08-21: qty 5

## 2018-08-21 MED ORDER — ATORVASTATIN CALCIUM 10 MG PO TABS
10.0000 mg | ORAL_TABLET | Freq: Every day | ORAL | Status: DC
  Administered 2018-08-21 – 2018-08-23 (×3): 10 mg via ORAL
  Filled 2018-08-21 (×3): qty 1

## 2018-08-21 MED ORDER — DOCUSATE SODIUM 100 MG PO CAPS
100.0000 mg | ORAL_CAPSULE | Freq: Two times a day (BID) | ORAL | Status: DC
  Administered 2018-08-21 – 2018-08-23 (×4): 100 mg via ORAL
  Filled 2018-08-21 (×3): qty 1

## 2018-08-21 MED ORDER — METOCLOPRAMIDE HCL 5 MG/ML IJ SOLN: 5.0000 mg | Freq: Three times a day (TID) | INTRAMUSCULAR | Status: DC | PRN

## 2018-08-21 MED ORDER — PROPOFOL 500 MG/50ML IV EMUL
INTRAVENOUS | Status: AC
  Filled 2018-08-21: qty 50

## 2018-08-21 MED ORDER — MENTHOL 3 MG MT LOZG: 1.0000 | LOZENGE | OROMUCOSAL | Status: DC | PRN

## 2018-08-21 MED ORDER — PANTOPRAZOLE SODIUM 40 MG PO TBEC
40.0000 mg | DELAYED_RELEASE_TABLET | Freq: Every day | ORAL | Status: DC
  Administered 2018-08-21 – 2018-08-23 (×3): 40 mg via ORAL
  Filled 2018-08-21 (×3): qty 1

## 2018-08-21 MED ORDER — EPHEDRINE SULFATE 50 MG/ML IJ SOLN
INTRAMUSCULAR | Status: DC | PRN
  Administered 2018-08-21: 5 mg via INTRAVENOUS

## 2018-08-21 MED ORDER — ROPIVACAINE HCL 7.5 MG/ML IJ SOLN
INTRAMUSCULAR | Status: DC | PRN
  Administered 2018-08-21: 30 mL via PERINEURAL

## 2018-08-21 MED ORDER — FLUTICASONE FUROATE-VILANTEROL 100-25 MCG/INH IN AEPB: 1.0000 | INHALATION_SPRAY | Freq: Every day | RESPIRATORY_TRACT | Status: DC | PRN

## 2018-08-21 MED ORDER — ALUM & MAG HYDROXIDE-SIMETH 200-200-20 MG/5ML PO SUSP: 30.0000 mL | ORAL | Status: DC | PRN

## 2018-08-21 MED ORDER — METHOCARBAMOL 500 MG PO TABS
500.0000 mg | ORAL_TABLET | Freq: Four times a day (QID) | ORAL | Status: DC | PRN
  Administered 2018-08-21 – 2018-08-23 (×5): 500 mg via ORAL
  Filled 2018-08-21 (×5): qty 1

## 2018-08-21 MED ORDER — PHENOL 1.4 % MT LIQD: 1.0000 | OROMUCOSAL | Status: DC | PRN

## 2018-08-21 MED ORDER — CEFAZOLIN SODIUM-DEXTROSE 2-4 GM/100ML-% IV SOLN
2.0000 g | INTRAVENOUS | Status: AC
  Administered 2018-08-21: 2 g via INTRAVENOUS

## 2018-08-21 MED ORDER — OXYCODONE HCL 5 MG PO TABS
5.0000 mg | ORAL_TABLET | ORAL | Status: DC | PRN
  Administered 2018-08-21: 10 mg via ORAL
  Administered 2018-08-21: 5 mg via ORAL
  Administered 2018-08-22 – 2018-08-23 (×3): 10 mg via ORAL
  Filled 2018-08-21 (×5): qty 2

## 2018-08-21 MED ORDER — SODIUM CHLORIDE 0.9 % IR SOLN
Status: DC | PRN
  Administered 2018-08-21: 1

## 2018-08-21 MED ORDER — CHLORHEXIDINE GLUCONATE 4 % EX LIQD: 60.0000 mL | Freq: Once | CUTANEOUS | Status: DC

## 2018-08-21 MED ORDER — CEFAZOLIN SODIUM-DEXTROSE 1-4 GM/50ML-% IV SOLN
1.0000 g | Freq: Four times a day (QID) | INTRAVENOUS | Status: AC
  Administered 2018-08-21 – 2018-08-22 (×2): 1 g via INTRAVENOUS
  Filled 2018-08-21 (×2): qty 50

## 2018-08-21 MED ORDER — OXYCODONE HCL 5 MG PO TABS
10.0000 mg | ORAL_TABLET | ORAL | Status: DC | PRN
  Administered 2018-08-22 – 2018-08-23 (×3): 10 mg via ORAL
  Filled 2018-08-21 (×3): qty 2

## 2018-08-21 MED ORDER — CLONIDINE HCL (ANALGESIA) 100 MCG/ML EP SOLN
EPIDURAL | Status: DC | PRN
  Administered 2018-08-21: 100 ug

## 2018-08-21 MED ORDER — PROPOFOL 500 MG/50ML IV EMUL
INTRAVENOUS | Status: DC | PRN
  Administered 2018-08-21: 80 ug/kg/min via INTRAVENOUS

## 2018-08-21 MED ORDER — DIPHENHYDRAMINE HCL 12.5 MG/5ML PO ELIX: 12.5000 mg | ORAL_SOLUTION | ORAL | Status: DC | PRN

## 2018-08-21 MED ORDER — PROPOFOL 1000 MG/100ML IV EMUL
INTRAVENOUS | Status: AC
  Filled 2018-08-21: qty 100

## 2018-08-21 MED ORDER — LORATADINE 10 MG PO TABS
10.0000 mg | ORAL_TABLET | Freq: Every day | ORAL | Status: DC
  Administered 2018-08-22 – 2018-08-23 (×2): 10 mg via ORAL
  Filled 2018-08-21 (×2): qty 1

## 2018-08-21 MED ORDER — FENTANYL CITRATE (PF) 100 MCG/2ML IJ SOLN
INTRAMUSCULAR | Status: AC
  Administered 2018-08-21: 50 ug
  Filled 2018-08-21: qty 2

## 2018-08-21 MED ORDER — RIVAROXABAN 10 MG PO TABS
10.0000 mg | ORAL_TABLET | Freq: Every day | ORAL | Status: DC
  Administered 2018-08-22 – 2018-08-23 (×2): 10 mg via ORAL
  Filled 2018-08-21 (×2): qty 1

## 2018-08-21 MED ORDER — PROPOFOL 10 MG/ML IV BOLUS
INTRAVENOUS | Status: AC
  Filled 2018-08-21: qty 20

## 2018-08-21 MED ORDER — HYDROMORPHONE HCL 1 MG/ML IJ SOLN
0.5000 mg | INTRAMUSCULAR | Status: DC | PRN
  Administered 2018-08-21 – 2018-08-22 (×4): 1 mg via INTRAVENOUS
  Filled 2018-08-21 (×4): qty 1

## 2018-08-21 MED ORDER — MIDAZOLAM HCL 2 MG/2ML IJ SOLN
INTRAMUSCULAR | Status: AC
  Administered 2018-08-21: 1 mg
  Filled 2018-08-21: qty 2

## 2018-08-21 MED ORDER — POLYETHYLENE GLYCOL 3350 17 G PO PACK: 17.0000 g | PACK | Freq: Every day | ORAL | Status: DC | PRN

## 2018-08-21 MED ORDER — CEFAZOLIN SODIUM-DEXTROSE 2-4 GM/100ML-% IV SOLN
INTRAVENOUS | Status: AC
  Filled 2018-08-21: qty 100

## 2018-08-21 MED ORDER — EPHEDRINE 5 MG/ML INJ
INTRAVENOUS | Status: AC
  Filled 2018-08-21: qty 10

## 2018-08-21 MED ORDER — 0.9 % SODIUM CHLORIDE (POUR BTL) OPTIME
TOPICAL | Status: DC | PRN
  Administered 2018-08-21: 1000 mL

## 2018-08-21 MED ORDER — GLYCOPYRROLATE PF 0.2 MG/ML IJ SOSY
PREFILLED_SYRINGE | INTRAMUSCULAR | Status: AC
  Filled 2018-08-21: qty 1

## 2018-08-21 MED ORDER — PREGABALIN 75 MG PO CAPS
75.0000 mg | ORAL_CAPSULE | Freq: Every day | ORAL | Status: DC
  Administered 2018-08-21: 75 mg via ORAL
  Filled 2018-08-21 (×2): qty 1

## 2018-08-21 MED ORDER — OXYCODONE HCL 5 MG PO TABS
ORAL_TABLET | ORAL | Status: AC
  Filled 2018-08-21: qty 1

## 2018-08-21 MED ORDER — SODIUM CHLORIDE 0.9 % IV SOLN
INTRAVENOUS | Status: DC | PRN
  Administered 2018-08-21: 30 ug/min via INTRAVENOUS

## 2018-08-21 MED ORDER — LACTATED RINGERS IV SOLN
Freq: Once | INTRAVENOUS | Status: AC
  Administered 2018-08-21: 14:00:00 via INTRAVENOUS

## 2018-08-21 MED ORDER — PROPOFOL 10 MG/ML IV BOLUS
INTRAVENOUS | Status: DC | PRN
  Administered 2018-08-21: 20 mg via INTRAVENOUS
  Administered 2018-08-21: 50 mg via INTRAVENOUS

## 2018-08-21 MED ORDER — LIDOCAINE 2% (20 MG/ML) 5 ML SYRINGE
INTRAMUSCULAR | Status: DC | PRN
  Administered 2018-08-21: 20 mg via INTRAVENOUS

## 2018-08-21 MED ORDER — ONDANSETRON HCL 4 MG/2ML IJ SOLN
4.0000 mg | Freq: Four times a day (QID) | INTRAMUSCULAR | Status: DC | PRN
  Administered 2018-08-22: 4 mg via INTRAVENOUS
  Filled 2018-08-21: qty 2

## 2018-08-21 MED ORDER — ONDANSETRON HCL 4 MG PO TABS: 4.0000 mg | ORAL_TABLET | Freq: Four times a day (QID) | ORAL | Status: DC | PRN

## 2018-08-21 SURGICAL SUPPLY — 69 items
BANDAGE ACE 6X5 VEL STRL LF (GAUZE/BANDAGES/DRESSINGS) ×4 IMPLANT
BANDAGE ESMARK 6X9 LF (GAUZE/BANDAGES/DRESSINGS) ×1 IMPLANT
BASEPLATE TIBIAL TRIATHALON 2 (Plate) ×1 IMPLANT
BEARIN TIBIAL TRIATH SZ 2X11M (Knees) ×2 IMPLANT
BEARING TIBIAL TRIATH SZ 2X11M (Knees) IMPLANT
BLADE SAG 18X100X1.27 (BLADE) ×2 IMPLANT
BNDG ELASTIC 6X10 VLCR STRL LF (GAUZE/BANDAGES/DRESSINGS) ×2 IMPLANT
BNDG ESMARK 6X9 LF (GAUZE/BANDAGES/DRESSINGS) ×2
BOWL SMART MIX CTS (DISPOSABLE) ×3 IMPLANT
CEMENT BONE SIMPLEX SPEEDSET (Cement) ×2 IMPLANT
COVER SURGICAL LIGHT HANDLE (MISCELLANEOUS) ×2 IMPLANT
COVER WAND RF STERILE (DRAPES) ×2 IMPLANT
CUFF TOURNIQUET SINGLE 34IN LL (TOURNIQUET CUFF) ×2 IMPLANT
CUFF TOURNIQUET SINGLE 44IN (TOURNIQUET CUFF) IMPLANT
DRAPE EXTREMITY T 121X128X90 (DISPOSABLE) ×2 IMPLANT
DRAPE HALF SHEET 40X57 (DRAPES) ×2 IMPLANT
DRAPE U-SHAPE 47X51 STRL (DRAPES) ×2 IMPLANT
DRSG PAD ABDOMINAL 8X10 ST (GAUZE/BANDAGES/DRESSINGS) ×2 IMPLANT
DRSG XEROFORM 1X8 (GAUZE/BANDAGES/DRESSINGS) ×1 IMPLANT
DURAPREP 26ML APPLICATOR (WOUND CARE) ×2 IMPLANT
ELECT CAUTERY BLADE 6.4 (BLADE) ×2 IMPLANT
ELECT REM PT RETURN 9FT ADLT (ELECTROSURGICAL) ×2
ELECTRODE REM PT RTRN 9FT ADLT (ELECTROSURGICAL) ×1 IMPLANT
FACESHIELD WRAPAROUND (MASK) ×4 IMPLANT
FACESHIELD WRAPAROUND OR TEAM (MASK) ×2 IMPLANT
FEMORAL PEG DISTAL FIXATION (Orthopedic Implant) ×1 IMPLANT
FEMORAL POST STABILIZED NO 3 (Orthopedic Implant) ×1 IMPLANT
GAUZE SPONGE 4X4 12PLY STRL (GAUZE/BANDAGES/DRESSINGS) ×2 IMPLANT
GAUZE XEROFORM 1X8 LF (GAUZE/BANDAGES/DRESSINGS) ×2 IMPLANT
GLOVE BIOGEL PI IND STRL 8 (GLOVE) ×2 IMPLANT
GLOVE BIOGEL PI INDICATOR 8 (GLOVE) ×2
GLOVE ORTHO TXT STRL SZ7.5 (GLOVE) ×2 IMPLANT
GLOVE SURG ORTHO 8.0 STRL STRW (GLOVE) ×2 IMPLANT
GOWN STRL REUS W/ TWL LRG LVL3 (GOWN DISPOSABLE) IMPLANT
GOWN STRL REUS W/ TWL XL LVL3 (GOWN DISPOSABLE) ×2 IMPLANT
GOWN STRL REUS W/TWL LRG LVL3 (GOWN DISPOSABLE)
GOWN STRL REUS W/TWL XL LVL3 (GOWN DISPOSABLE) ×2
HANDPIECE INTERPULSE COAX TIP (DISPOSABLE) ×1
IMMOBILIZER KNEE 22 (SOFTGOODS) ×1 IMPLANT
IMMOBILIZER KNEE 22 UNIV (SOFTGOODS) ×2 IMPLANT
KIT BASIN OR (CUSTOM PROCEDURE TRAY) ×2 IMPLANT
KIT TURNOVER KIT B (KITS) ×2 IMPLANT
MANIFOLD NEPTUNE II (INSTRUMENTS) ×2 IMPLANT
NDL SAFETY ECLIPSE 18X1.5 (NEEDLE) IMPLANT
NEEDLE HYPO 18GX1.5 SHARP (NEEDLE)
NS IRRIG 1000ML POUR BTL (IV SOLUTION) ×2 IMPLANT
PACK TOTAL JOINT (CUSTOM PROCEDURE TRAY) ×2 IMPLANT
PAD ABD 8X10 STRL (GAUZE/BANDAGES/DRESSINGS) ×1 IMPLANT
PAD ARMBOARD 7.5X6 YLW CONV (MISCELLANEOUS) ×2 IMPLANT
PADDING CAST COTTON 6X4 STRL (CAST SUPPLIES) ×2 IMPLANT
PATELLA TRIATHALON (Knees) ×1 IMPLANT
SET HNDPC FAN SPRY TIP SCT (DISPOSABLE) ×1 IMPLANT
SET PAD KNEE POSITIONER (MISCELLANEOUS) ×2 IMPLANT
STAPLER VISISTAT 35W (STAPLE) ×1 IMPLANT
STRIP CLOSURE SKIN 1/2X4 (GAUZE/BANDAGES/DRESSINGS) IMPLANT
SUCTION FRAZIER HANDLE 10FR (MISCELLANEOUS) ×1
SUCTION TUBE FRAZIER 10FR DISP (MISCELLANEOUS) ×1 IMPLANT
SUT MNCRL AB 4-0 PS2 18 (SUTURE) IMPLANT
SUT VIC AB 0 CT1 27 (SUTURE) ×1
SUT VIC AB 0 CT1 27XBRD ANBCTR (SUTURE) ×1 IMPLANT
SUT VIC AB 1 CT1 27 (SUTURE) ×2
SUT VIC AB 1 CT1 27XBRD ANBCTR (SUTURE) ×2 IMPLANT
SUT VIC AB 2-0 CT1 27 (SUTURE) ×2
SUT VIC AB 2-0 CT1 TAPERPNT 27 (SUTURE) ×2 IMPLANT
SYR 50ML LL SCALE MARK (SYRINGE) IMPLANT
TOWEL OR 17X24 6PK STRL BLUE (TOWEL DISPOSABLE) ×2 IMPLANT
TOWEL OR 17X26 10 PK STRL BLUE (TOWEL DISPOSABLE) ×2 IMPLANT
TRAY CATH 16FR W/PLASTIC CATH (SET/KITS/TRAYS/PACK) IMPLANT
WRAP KNEE MAXI GEL POST OP (GAUZE/BANDAGES/DRESSINGS) ×2 IMPLANT

## 2018-08-21 NOTE — H&P (Signed)
TOTAL KNEE ADMISSION H&P  Patient is being admitted for right total knee arthroplasty.  Subjective:  Chief Complaint:right knee pain.  HPI: Kristina Burns, 73 y.o. female, has a history of pain and functional disability in the right knee due to arthritis and has failed non-surgical conservative treatments for greater than 12 weeks to includeNSAID's and/or analgesics, corticosteriod injections, viscosupplementation injections, flexibility and strengthening excercises, use of assistive devices and activity modification.  Onset of symptoms was gradual, starting 3 years ago with gradually worsening course since that time. The patient noted no past surgery on the right knee(s).  Patient currently rates pain in the right knee(s) at 10 out of 10 with activity. Patient has night pain, worsening of pain with activity and weight bearing, pain that interferes with activities of daily living, pain with passive range of motion, crepitus and joint swelling.  Patient has evidence of subchondral sclerosis, periarticular osteophytes and joint space narrowing by imaging studies. There is no active infection.  Patient Active Problem List   Diagnosis Date Noted  . Chronic pain of both knees 07/10/2017  . Unilateral primary osteoarthritis, left knee 07/10/2017  . Unilateral primary osteoarthritis, right knee 07/10/2017   Past Medical History:  Diagnosis Date  . Arthritis   . Asthma   . High cholesterol     Past Surgical History:  Procedure Laterality Date  . ABDOMINAL HYSTERECTOMY    . BACK SURGERY      Current Facility-Administered Medications  Medication Dose Route Frequency Provider Last Rate Last Dose  . ceFAZolin (ANCEF) 2-4 GM/100ML-% IVPB           . ceFAZolin (ANCEF) IVPB 2g/100 mL premix  2 g Intravenous On Call to OR Pete Pelt, PA-C      . chlorhexidine (HIBICLENS) 4 % liquid 4 application  60 mL Topical Once Erskine Emery W, PA-C      . tranexamic acid (CYKLOKAPRON) IVPB 1,000 mg   1,000 mg Intravenous To OR Mcarthur Rossetti, MD       Current Outpatient Medications  Medication Sig Dispense Refill Last Dose  . acetaminophen (TYLENOL) 500 MG tablet Take 500-1,000 mg by mouth every 6 (six) hours as needed for moderate pain or headache.     Marland Kitchen atorvastatin (LIPITOR) 10 MG tablet Take 10 mg by mouth daily.     . Cholecalciferol (VITAMIN D3 PO) Take 1 capsule by mouth daily.     . Cyanocobalamin (VITAMIN B-12 PO) Take 1 tablet by mouth daily.     . fluticasone furoate-vilanterol (BREO ELLIPTA) 100-25 MCG/INH AEPB Inhale 1 puff into the lungs daily as needed (for shortness of breath or wheezing).     Marland Kitchen loratadine (CLARITIN) 10 MG tablet Take 10 mg by mouth daily.     . pregabalin (LYRICA) 75 MG capsule Take 75 mg by mouth daily.      Marland Kitchen acetaminophen-codeine (TYLENOL #3) 300-30 MG tablet Take 1-2 tablets by mouth every 4 (four) hours as needed for moderate pain. 30 tablet 0 Not Taking at Unknown time  . clobetasol cream (TEMOVATE) 8.58 % Apply 1 application topically daily as needed (will just take as needed).      Allergies  Allergen Reactions  . Aspirin Swelling    Lips swell up.  She hasn't taken in awhile    Social History   Tobacco Use  . Smoking status: Never Smoker  . Smokeless tobacco: Never Used  Substance Use Topics  . Alcohol use: Never    Frequency: Never  Family History  Problem Relation Age of Onset  . Breast cancer Neg Hx      Review of Systems  Musculoskeletal: Positive for joint pain.  All other systems reviewed and are negative.   Objective:  Physical Exam  Constitutional: She is oriented to person, place, and time. She appears well-developed and well-nourished.  HENT:  Head: Normocephalic and atraumatic.  Eyes: Pupils are equal, round, and reactive to light. EOM are normal.  Neck: Normal range of motion. Neck supple.  Cardiovascular: Normal rate.  Respiratory: Effort normal.  GI: Soft. Bowel sounds are normal.   Musculoskeletal:     Right knee: She exhibits decreased range of motion, swelling, effusion, abnormal alignment, bony tenderness and abnormal meniscus. Tenderness found. Medial joint line and lateral joint line tenderness noted.  Neurological: She is alert and oriented to person, place, and time.  Skin: Skin is warm and dry.  Psychiatric: She has a normal mood and affect.    Vital signs in last 24 hours:    Labs:   Estimated body mass index is 34.24 kg/m as calculated from the following:   Height as of 08/13/18: 5\' 2"  (1.575 m).   Weight as of 08/13/18: 84.9 kg.   Imaging Review Plain radiographs demonstrate severe degenerative joint disease of the right knee(s). The overall alignment ismild varus. The bone quality appears to be good for age and reported activity level.   Preoperative templating of the joint replacement has been completed, documented, and submitted to the Operating Room personnel in order to optimize intra-operative equipment management.    Patient's anticipated LOS is less than 2 midnights, meeting these requirements: - Younger than 17 - Lives within 1 hour of care - Has a competent adult at home to recover with post-op recover - NO history of  - Chronic pain requiring opiods  - Diabetes  - Coronary Artery Disease  - Heart failure  - Heart attack  - Stroke  - DVT/VTE  - Cardiac arrhythmia  - Respiratory Failure/COPD  - Renal failure  - Anemia  - Advanced Liver disease        Assessment/Plan:  End stage arthritis, right knee   The patient history, physical examination, clinical judgment of the provider and imaging studies are consistent with end stage degenerative joint disease of the right knee(s) and total knee arthroplasty is deemed medically necessary. The treatment options including medical management, injection therapy arthroscopy and arthroplasty were discussed at length. The risks and benefits of total knee arthroplasty were presented and  reviewed. The risks due to aseptic loosening, infection, stiffness, patella tracking problems, thromboembolic complications and other imponderables were discussed. The patient acknowledged the explanation, agreed to proceed with the plan and consent was signed. Patient is being admitted for inpatient treatment for surgery, pain control, PT, OT, prophylactic antibiotics, VTE prophylaxis, progressive ambulation and ADL's and discharge planning. The patient is planning to be discharged home with home health services

## 2018-08-21 NOTE — Transfer of Care (Signed)
Immediate Anesthesia Transfer of Care Note  Patient: Kristina Burns  Procedure(s) Performed: RIGHT TOTAL KNEE ARTHROPLASTY (Right Knee)  Patient Location: PACU  Anesthesia Type:MAC combined with regional for post-op pain  Level of Consciousness: drowsy  Airway & Oxygen Therapy: Patient Spontanous Breathing and Patient connected to face mask oxygen  Post-op Assessment: Report given to RN and Post -op Vital signs reviewed and stable  Post vital signs: Reviewed and stable  Last Vitals:  Vitals Value Taken Time  BP 96/63 08/21/2018  4:40 PM  Temp    Pulse 81 08/21/2018  4:42 PM  Resp 18 08/21/2018  4:42 PM  SpO2 99 % 08/21/2018  4:42 PM  Vitals shown include unvalidated device data.  Last Pain:  Vitals:   08/21/18 1218  TempSrc: Oral      Patients Stated Pain Goal: 3 (40/76/80 8811)  Complications: No apparent anesthesia complications

## 2018-08-21 NOTE — Progress Notes (Signed)
Notified Anesthesia MD pt complaining of a little back pain at spinal insertion site.  No new orders and MD aware.

## 2018-08-21 NOTE — Brief Op Note (Signed)
08/21/2018  4:16 PM  PATIENT:  Kristina Burns  73 y.o. female  PRE-OPERATIVE DIAGNOSIS:  osteoarthritis right knee  POST-OPERATIVE DIAGNOSIS:  osteoarthritis right knee  PROCEDURE:  Procedure(s): RIGHT TOTAL KNEE ARTHROPLASTY (Right)  SURGEON:  Surgeon(s) and Role:    Mcarthur Rossetti, MD - Primary  PHYSICIAN ASSISTANT:  Benita Stabile, PA-C  ANESTHESIA:   regional and spinal  EBL:  50 mL   COUNTS:  YES  TOURNIQUET:   Total Tourniquet Time Documented: Thigh (Right) - 58 minutes Total: Thigh (Right) - 58 minutes   DICTATION: .Other Dictation: Dictation Number [id number]  PLAN OF CARE: Admit to inpatient   PATIENT DISPOSITION:  PACU - hemodynamically stable.   Delay start of Pharmacological VTE agent (>24hrs) due to surgical blood loss or risk of bleeding: no

## 2018-08-21 NOTE — Progress Notes (Signed)
Orthopedic Tech Progress Note Patient Details:  Kristina Burns 10-03-1945 [id number]  CPM Right Knee CPM Right Knee: On Right Knee Flexion (Degrees): 90 Right Knee Extension (Degrees): 0 Additional Comments: Trapeze bar foot roll  Post Interventions Patient Tolerated: Well Instructions Provided: Care of device, Adjustment of device  [place] Pink 08/21/2018, 5:20 PM

## 2018-08-21 NOTE — Anesthesia Preprocedure Evaluation (Addendum)
Anesthesia Evaluation  Patient identified by MRN, date of birth, ID band Patient awake    Reviewed: Allergy & Precautions, H&P , NPO status , Patient's Chart, lab work & pertinent test results, reviewed documented beta blocker date and time   Airway Mallampati: I  TM Distance: >3 FB Neck ROM: full    Dental no notable dental hx. (+) Teeth Intact   Pulmonary asthma ,    Pulmonary exam normal breath sounds clear to auscultation       Cardiovascular Exercise Tolerance: Good negative cardio ROS   Rhythm:regular Rate:Normal     Neuro/Psych negative neurological ROS  negative psych ROS   GI/Hepatic negative GI ROS, Neg liver ROS,   Endo/Other  negative endocrine ROS  Renal/GU negative Renal ROS  negative genitourinary   Musculoskeletal  (+) Arthritis , Osteoarthritis,    Abdominal   Peds  Hematology negative hematology ROS (+)   Anesthesia Other Findings   Reproductive/Obstetrics negative OB ROS                            Anesthesia Physical Anesthesia Plan  ASA: II  Anesthesia Plan: MAC and Spinal   Post-op Pain Management:    Induction:   PONV Risk Score and Plan: 2 and Treatment may vary due to age or medical condition  Airway Management Planned: Nasal Cannula, Simple Face Mask and Mask  Additional Equipment:   Intra-op Plan:   Post-operative Plan:   Informed Consent: I have reviewed the patients History and Physical, chart, labs and discussed the procedure including the risks, benefits and alternatives for the proposed anesthesia with the patient or authorized representative who has indicated his/her understanding and acceptance.     Dental Advisory Given  Plan Discussed with: CRNA, Anesthesiologist and Surgeon  Anesthesia Plan Comments: (  )        Anesthesia Quick Evaluation

## 2018-08-21 NOTE — Op Note (Signed)
NAMECABELLA, KIMM MEDICAL RECORD [id number] ACCOUNT [id number] DATE OF BIRTH:[birth year] FACILITY: [facility] LOCATION: [facility] PHYSICIAN:Latese Dufault [place], MD  OPERATIVE REPORT  DATE OF PROCEDURE:  08/21/2018  PREOPERATIVE DIAGNOSIS:  Primary osteoarthritis and degenerative joint disease, right knee.  POSTOPERATIVE DIAGNOSIS:  Primary osteoarthritis and degenerative joint disease, right knee.  PROCEDURE:  Right total knee arthroplasty.  IMPLANTS:  Stryker Triathlon cemented knee system with size 3 femur, size 2 tibial tray, 11 mm fixed bearing polyethylene insert, size 28, 8 mm symmetric patellar button.  SURGEON:  Lind Guest. Ninfa Linden, MD  ASSISTANT:  Erskine Emery, PA-C  ANESTHESIA: 1.  Right lower extremity regional block. 2.  Spinal.  TOURNIQUET TIME:  Under 1 hour.  ANTIBIOTICS:  Two grams IV Ancef.  ESTIMATED BLOOD LOSS:  Less than 100 mL.  COMPLICATIONS:  None.  INDICATIONS:  The patient is a very pleasant 73 year old patient well known to me.  She has debilitating arthritis of both her knees that has been worsening over several years now.  It has gotten to the point where her right knee pain has detrimentally  affected her activities of daily living, her mobility, and her quality of life.  At this point, she does wish to proceed with total knee arthroplasty given her high-level functional mobility and given the fact that all conservative treatment measures  have failed.  With surgery, she understands the risk of acute blood loss anemia, nerve or vessel injury, fracture, infection, DVT, and implant failure.  She understands her goals are to decrease pain, improve mobility and overall improve quality of life.  DESCRIPTION OF PROCEDURE:  After informed consent was obtained and appropriate right knee was marked, anesthesia obtained and an adductor canal block in the holding room, she was then brought to the operating room and sat up on her operating table where   spinal anesthesia was then obtained.  She was laid in the supine position.  A Foley catheter was placed, and a nonsterile tourniquet was placed around her upper right thigh.  Her right thigh, leg, knee and ankle were prepped and draped with DuraPrep and  sterile drapes including a sterile stockinette.  Time-out was called, and she was identified as correct patient, correct right knee.  We then used an Esmarch to wrap that leg, and tourniquet was inflated to 300 mm of pressure.  We then made a direct  midline incision over the patella and carried this proximally and distally.  We dissected down the knee joint and carried out a medial parapatellar arthrotomy, finding a moderate joint effusion and significant arthritis throughout her right knee.  We  removed remnants of ACL, PCL, medial and lateral meniscus.  With the knee in a flexed position, we used an extramedullary cutting guide to make our proximal tibia cut.  We corrected for varus and valgus in a neutral slope taking 9 mm off the high side.   We made this cut without difficulty.  We then went to the femur and used an intramedullary cutting guide for our distal femoral cut, setting this at a 10 mm distal femoral cut due to a slight flexion contracture.  We made this cut without difficulty,  setting the cutting guide for 5 degrees externally rotated for right knee.  With the knee back down in full extension, we had a 9 mm extension block and she had full extension.  We then went back to the femur and used our femoral sizing guide based off  the epicondylar axis, choosing a size 3 femur.  We put a 4-in-1 cutting block for a size 3 femur and made our anterior and posterior cuts followed by our chamfer cuts.  We then made our femoral box cut for a size 3 femur.  We then went back to the tibia.   We used a tibial tray size 2 for coverage of the tibia, setting the rotation off the tibial tubercle and the femur.  We did a keel punch off of this.  With a size 2  tibia and the size 3 femur in place, we trialed a 9 and then 11 mm fixed bearing trial  polyethylene insert.  We were pleased with stability with an 11 mm insert.  We then made our patellar cut and drilled 3 holes for a size 28 symmetric 8 mm patellar button.  With all trial instrumentation in place, I put the knee through a range of motion  and it felt stable.  We then removed all instrumentation from the knee and irrigated the knee with normal saline solution using pulsatile lavage.  We mixed our cement and then cemented the real Stryker Triathlon tibial tray size 2 followed by the real  size 3 right femur.  We placed our real fixed bearing 11 polyethylene insert and cemented our patellar button.  We removed excess cement debris from the knee and held the knee in an extended position until the cement hardened and cured.  We then let the  tourniquet down.  Hemostasis was obtained with electrocautery.  We closed the arthrotomy with interrupted #1 Vicryl suture followed by 0 Vicryl in the deep tissue, 2-0 Vicryl subcutaneous tissue and interrupted staples on the skin.  Xeroform and a  well-padded sterile dressing were applied.  She was taken to the recovery room in stable condition.  All final counts were correct.  There were no complications noted.  Of note, Benita Stabile, PA-C, assisted the entire case.  Assistance was crucial for  facilitating all aspects of this case.  LN/NUANCE  D:08/21/2018 T:08/21/2018 JOB:[id number]

## 2018-08-21 NOTE — Anesthesia Procedure Notes (Signed)
Anesthesia Regional Block: Adductor canal block   Pre-Anesthetic Checklist: ,, timeout performed, Correct Patient, Correct Site, Correct Laterality, Correct Procedure, Correct Position, site marked, Risks and benefits discussed,  Surgical consent,  Pre-op evaluation,  At surgeon's request and post-op pain management  Laterality: Right  Prep: chloraprep       Needles:  Injection technique: Single-shot  Needle Type: Echogenic Stimulator Needle     Needle Length: 5cm  Needle Gauge: 22     Additional Needles:   Procedures:, nerve stimulator,,, ultrasound used (permanent image in chart),,,,  Narrative:  Start time: 08/21/2018 12:20 PM End time: 08/21/2018 12:25 PM Injection made incrementally with aspirations every 5 mL.  Performed by: Personally  Anesthesiologist: Janeece Riggers, MD  Additional Notes: Functioning IV was confirmed and monitors were applied.  A 63mm 22ga Arrow echogenic stimulator needle was used. Sterile prep and drape,hand hygiene and sterile gloves were used. Ultrasound guidance: relevant anatomy identified, needle position confirmed, local anesthetic spread visualized around nerve(s)., vascular puncture avoided.  Image printed for medical record. Negative aspiration and negative test dose prior to incremental administration of local anesthetic. The patient tolerated the procedure well.

## 2018-08-22 ENCOUNTER — Encounter (HOSPITAL_COMMUNITY): Payer: Self-pay

## 2018-08-22 ENCOUNTER — Other Ambulatory Visit: Payer: Self-pay

## 2018-08-22 DIAGNOSIS — M21371 Foot drop, right foot: Secondary | ICD-10-CM | POA: Diagnosis not present

## 2018-08-22 DIAGNOSIS — G8929 Other chronic pain: Secondary | ICD-10-CM | POA: Diagnosis not present

## 2018-08-22 DIAGNOSIS — M1711 Unilateral primary osteoarthritis, right knee: Secondary | ICD-10-CM | POA: Diagnosis not present

## 2018-08-22 DIAGNOSIS — E7800 Pure hypercholesterolemia, unspecified: Secondary | ICD-10-CM | POA: Diagnosis not present

## 2018-08-22 DIAGNOSIS — Z79899 Other long term (current) drug therapy: Secondary | ICD-10-CM | POA: Diagnosis not present

## 2018-08-22 LAB — BASIC METABOLIC PANEL
ANION GAP: 10 (ref 5–15)
BUN: 13 mg/dL (ref 8–23)
CO2: 26 mmol/L (ref 22–32)
Calcium: 9.1 mg/dL (ref 8.9–10.3)
Chloride: 105 mmol/L (ref 98–111)
Creatinine, Ser: 0.72 mg/dL (ref 0.44–1.00)
GFR calc Af Amer: >60 mL/min (ref >60)
GFR calc non Af Amer: >60 mL/min (ref >60)
Glucose, Bld: 139 mg/dL — ABNORMAL HIGH (ref 70–99)
Potassium: 4.5 mmol/L (ref 3.5–5.1)
Sodium: 141 mmol/L (ref 135–145)

## 2018-08-22 LAB — CBC
HCT: 34.5 % — ABNORMAL LOW (ref 36.0–46.0)
Hemoglobin: 11.3 g/dL — ABNORMAL LOW (ref 12.0–15.0)
MCH: 29.6 pg (ref 26.0–34.0)
MCHC: 32.8 g/dL (ref 30.0–36.0)
MCV: 90.3 fL (ref 80.0–100.0)
Platelets: 191 10*3/uL (ref 150–400)
RBC: 3.82 MIL/uL — ABNORMAL LOW (ref 3.87–5.11)
RDW: 12.8 % (ref 11.5–15.5)
WBC: 9.2 10*3/uL (ref 4.0–10.5)
nRBC: 0 % (ref 0.0–0.2)

## 2018-08-22 NOTE — Discharge Instructions (Addendum)
Information on my medicine - XARELTO (Rivaroxaban) INSTRUCTIONS AFTER JOINT REPLACEMENT   o Remove items at home which could result in a fall. This includes throw rugs or furniture in walking pathways o ICE to the affected joint every three hours while awake for 30 minutes at a time, for at least the first 3-5 days, and then as needed for pain and swelling.  Continue to use ice for pain and swelling. You may notice swelling that will progress down to the foot and ankle.  This is normal after surgery.  Elevate your leg when you are not up walking on it.   o Continue to use the breathing machine you got in the hospital (incentive spirometer) which will help keep your temperature down.  It is common for your temperature to cycle up and down following surgery, especially at night when you are not up moving around and exerting yourself.  The breathing machine keeps your lungs expanded and your temperature down.   DIET:  As you were doing prior to hospitalization, we recommend a well-balanced diet.  DRESSING / WOUND CARE / SHOWERING  Keep the surgical dressing until follow up.  The dressing is water proof, so you can shower without any extra covering.  IF THE DRESSING FALLS OFF or the wound gets wet inside, change the dressing with sterile gauze.  Please use good hand washing techniques before changing the dressing.  Do not use any lotions or creams on the incision until instructed by your surgeon.    ACTIVITY  o Increase activity slowly as tolerated, but follow the weight bearing instructions below.   o No driving for 6 weeks or until further direction given by your physician.  You cannot drive while taking narcotics.  o No lifting or carrying greater than 10 lbs. until further directed by your surgeon. o Avoid periods of inactivity such as sitting longer than an hour when not asleep. This helps prevent blood clots.  o You may return to work once you are authorized by your doctor.     WEIGHT  BEARING   Weight bearing as tolerated with assist device (walker, cane, etc) as directed, use it as long as suggested by your surgeon or therapist, typically at least 4-6 weeks.   EXERCISES  Results after joint replacement surgery are often greatly improved when you follow the exercise, range of motion and muscle strengthening exercises prescribed by your doctor. Safety measures are also important to protect the joint from further injury. Any time any of these exercises cause you to have increased pain or swelling, decrease what you are doing until you are comfortable again and then slowly increase them. If you have problems or questions, call your caregiver or physical therapist for advice.   Rehabilitation is important following a joint replacement. After just a few days of immobilization, the muscles of the leg can become weakened and shrink (atrophy).  These exercises are designed to build up the tone and strength of the thigh and leg muscles and to improve motion. Often times heat used for twenty to thirty minutes before working out will loosen up your tissues and help with improving the range of motion but do not use heat for the first two weeks following surgery (sometimes heat can increase post-operative swelling).   These exercises can be done on a training (exercise) mat, on the floor, on a table or on a bed. Use whatever works the best and is most comfortable for you.    Use music or  television while you are exercising so that the exercises are a pleasant break in your day. This will make your life better with the exercises acting as a break in your routine that you can look forward to.   Perform all exercises about fifteen times, three times per day or as directed.  You should exercise both the operative leg and the other leg as well.  Exercises include:    Quad Sets - Tighten up the muscle on the front of the thigh (Quad) and hold for 5-10 seconds.    Straight Leg Raises - With your  knee straight (if you were given a brace, keep it on), lift the leg to 60 degrees, hold for 3 seconds, and slowly lower the leg.  Perform this exercise against resistance later as your leg gets stronger.   Leg Slides: Lying on your back, slowly slide your foot toward your buttocks, bending your knee up off the floor (only go as far as is comfortable). Then slowly slide your foot back down until your leg is flat on the floor again.   Angel Wings: Lying on your back spread your legs to the side as far apart as you can without causing discomfort.   Hamstring Strength:  Lying on your back, push your heel against the floor with your leg straight by tightening up the muscles of your buttocks.  Repeat, but this time bend your knee to a comfortable angle, and push your heel against the floor.  You may put a pillow under the heel to make it more comfortable if necessary.   A rehabilitation program following joint replacement surgery can speed recovery and prevent re-injury in the future due to weakened muscles. Contact your doctor or a physical therapist for more information on knee rehabilitation.    CONSTIPATION  Constipation is defined medically as fewer than three stools per week and severe constipation as less than one stool per week.  Even if you have a regular bowel pattern at home, your normal regimen is likely to be disrupted due to multiple reasons following surgery.  Combination of anesthesia, postoperative narcotics, change in appetite and fluid intake all can affect your bowels.   YOU MUST use at least one of the following options; they are listed in order of increasing strength to get the job done.  They are all available over the counter, and you may need to use some, POSSIBLY even all of these options:    Drink plenty of fluids (prune juice may be helpful) and high fiber foods Colace 100 mg by mouth twice a day  Senokot for constipation as directed and as needed Dulcolax (bisacodyl), take  with full glass of water  Miralax (polyethylene glycol) once or twice a day as needed.  If you have tried all these things and are unable to have a bowel movement in the first 3-4 days after surgery call either your surgeon or your primary doctor.    If you experience loose stools or diarrhea, hold the medications until you stool forms back up.  If your symptoms do not get better within 1 week or if they get worse, check with your doctor.  If you experience "the worst abdominal pain ever" or develop nausea or vomiting, please contact the office immediately for further recommendations for treatment.   ITCHING:  If you experience itching with your medications, try taking only a single pain pill, or even half a pain pill at a time.  You can also use Benadryl over  the counter for itching or also to help with sleep.   TED HOSE STOCKINGS:  Use stockings on both legs until for at least 2 weeks or as directed by physician office. They may be removed at night for sleeping.  MEDICATIONS:  See your medication summary on the After Visit Summary that nursing will review with you.  You may have some home medications which will be placed on hold until you complete the course of blood thinner medication.  It is important for you to complete the blood thinner medication as prescribed.  PRECAUTIONS:  If you experience chest pain or shortness of breath - call 911 immediately for transfer to the hospital emergency department.   If you develop a fever greater that 101 F, purulent drainage from wound, increased redness or drainage from wound, foul odor from the wound/dressing, or calf pain - CONTACT YOUR SURGEON.                                                   FOLLOW-UP APPOINTMENTS:  If you do not already have a post-op appointment, please call the office for an appointment to be seen by your surgeon.  Guidelines for how soon to be seen are listed in your After Visit Summary, but are typically between 1-4 weeks  after surgery.  OTHER INSTRUCTIONS:   Knee Replacement:  Do not place pillow under knee, focus on keeping the knee straight while resting. CPM instructions: 0-90 degrees, 2 hours in the morning, 2 hours in the afternoon, and 2 hours in the evening. Place foam block, curve side up under heel at all times except when in CPM or when walking.  DO NOT modify, tear, cut, or change the foam block in any way.  MAKE SURE YOU:   Understand these instructions.   Get help right away if you are not doing well or get worse.    Thank you for letting us be a part of your medical care team.  It is a privilege we respect greatly.  We hope these instructions will help you stay on track for a fast and full recovery!   Why was Xarelto prescribed for you? Xarelto was prescribed for you to reduce the risk of blood clots forming after orthopedic surgery. The medical term for these abnormal blood clots is venous thromboembolism (VTE).  What do you need to know about xarelto ? Take your Xarelto ONCE DAILY at the same time every day. You may take it either with or without food.  If you have difficulty swallowing the tablet whole, you may crush it and mix in applesauce just prior to taking your dose.  Take Xarelto exactly as prescribed by your doctor and DO NOT stop taking Xarelto without talking to the doctor who prescribed the medication.  Stopping without other VTE prevention medication to take the place of Xarelto may increase your risk of developing a clot.  After discharge, you should have regular check-up appointments with your healthcare provider that is prescribing your Xarelto.    What do you do if you miss a dose? If you miss a dose, take it as soon as you remember on the same day then continue your regularly scheduled once daily regimen the next day. Do not take two doses of Xarelto on the same day.   Important Safety Information A possible side effect  of Xarelto is bleeding. You should  call your healthcare provider right away if you experience any of the following: ? Bleeding from an injury or your nose that does not stop. ? Unusual colored urine (red or dark brown) or unusual colored stools (red or black). ? Unusual bruising for unknown reasons. ? A serious fall or if you hit your head (even if there is no bleeding).  Some medicines may interact with Xarelto and might increase your risk of bleeding while on Xarelto. To help avoid this, consult your healthcare provider or pharmacist prior to using any new prescription or non-prescription medications, including herbals, vitamins, non-steroidal anti-inflammatory drugs (NSAIDs) and supplements.  This website has more information on Xarelto: [email or web address]

## 2018-08-22 NOTE — Care Management Note (Signed)
Case Management Note  Patient Details  Name: Kristina Burns MRN: [id number] Date of Birth: 01-Mar-1946  Subjective/Objective:                 Hip   Action/Plan:  Spoke w patient at bedside. She states that she will have support from family at home. She is agreeable to [facility] for [facility] services as pre arranged through MD office. She states that she has RW and a toilet riser at home, and has no needs for additional DME.   Expected Discharge Date:                  Expected Discharge Plan:  [place]  In-House Referral:     Discharge planning Services  CM Consult  Post Acute Care Choice:  Home Health Choice offered to:  Patient  DME Arranged:    DME Agency:     HH Arranged:  PT [place]:  [facility] [employer])  Status of Service:  Completed, signed off  If discussed at [employer], dates discussed:    Additional Comments:  Carles Collet, RN 08/22/2018, 11:53 AM

## 2018-08-22 NOTE — Care Management (Signed)
Bene check sent for xaralto

## 2018-08-22 NOTE — Care Management (Signed)
#    4.   S/W  BRITTANY @ [facility] # [phone]  RIVAROXABAN : NONE FORMULARY  XARELTO  10 MG DAILY COVER- YES CO-PAY- $ 37.00 TIER-  3 DRUG PRIOR APPROVAL- NO  PREFERRED  PHARMACY  : YES [facility]

## 2018-08-22 NOTE — Progress Notes (Signed)
Subjective: 1 Day Post-Op Procedure(s) (LRB): RIGHT TOTAL KNEE ARTHROPLASTY (Right) Patient reports pain as moderate.    Objective: Vital signs in last 24 hours: Temp:  [97.5 F (36.4 C)-98.2 F (36.8 C)] 97.8 F (36.6 C) (01/15 0351) Pulse Rate:  [51-86] 72 (01/15 0351) Resp:  [11-21] 16 (01/15 0351) BP: (94-149)/(62-85) 104/65 (01/15 0351) SpO2:  [94 %-100 %] 95 % (01/15 0351) Weight:  [83 kg] 83 kg (01/14 1218)  Intake/Output from previous day: 01/14 0701 - 01/15 0700 In: 1862.9 [P.O.:120; I.V.:1642.9; IV Piggyback:100] Out: 1450 [Urine:1400; Blood:50] Intake/Output this shift: No intake/output data recorded.  Recent Labs    08/22/18 0157  HGB 11.3*   Recent Labs    08/22/18 0157  WBC 9.2  RBC 3.82*  HCT 34.5*  PLT 191   Recent Labs    08/22/18 0157  NA 141  K 4.5  CL 105  CO2 26  BUN 13  CREATININE 0.72  GLUCOSE 139*  CALCIUM 9.1   No results for input(s): LABPT, INR in the last 72 hours.  Sensation intact distally Intact pulses distally Dorsiflexion/Plantar flexion intact Incision: dressing C/D/I Compartment soft  Assessment/Plan: 1 Day Post-Op Procedure(s) (LRB): RIGHT TOTAL KNEE ARTHROPLASTY (Right) Up with therapy Discharge home with home health next 1-2 days.    Kristina Burns 08/22/2018, 7:31 AM

## 2018-08-22 NOTE — Care Management CC44 (Signed)
Condition Code 44 Documentation Completed  Patient Details  Name: Kristina Burns MRN: [id number] Date of Birth: 06-13-1946   Condition Code 44 given:  Yes Patient signature on Condition Code 44 notice:  Yes Documentation of 2 MD's agreement:  Yes Code 44 added to claim:  Yes    Carles Collet, RN 08/22/2018, 11:52 AM

## 2018-08-22 NOTE — Care Management Obs Status (Signed)
[place] NOTIFICATION   Patient Details  Name: Kristina Burns MRN: [id number] Date of Birth: 28-Sep-1945   Medicare Observation Status Notification Given:  Yes    Carles Collet, RN 08/22/2018, 11:52 AM

## 2018-08-22 NOTE — Evaluation (Signed)
Occupational Therapy Evaluation Patient Details Name: Kristina Burns MRN: [id number] DOB: 07-17-1946 Today's Date: 08/22/2018    History of Present Illness Admitted for RTKA, WBAT;  has a past medical history of Arthritis, Asthma, and High cholesterol.   Clinical Impression   PTA, pt was living with her husband and was independent. Currently, pt requires Min-Max A for LB ADLs and Min A for functional mobility using RW. Pt with some sleepiness and lethargy; pt reports she feels sleepy because of pain medication. However, she continues to be highly motivated to participate in therapy. Pt will benefit from further acute OT to address LB ADLs and safe shower transfer. Recommend dc home once medically stable per physician.      Follow Up Recommendations  Follow surgeon's recommendation for DC plan and follow-up therapies;Supervision/Assistance - 24 hour    Equipment Recommendations  None recommended by OT    Recommendations for Other Services PT consult     Precautions / Restrictions Precautions Precautions: Knee Precaution Booklet Issued: Yes (comment) Precaution Comments: Restrictions Weight Bearing Restrictions: Yes RLE Weight Bearing: Weight bearing as tolerated      Mobility Bed Mobility Overal bed mobility: Needs Assistance Bed Mobility: Supine to Sit     Supine to sit: Min assist;HOB elevated     General bed mobility comments: Min A to move RLE towards EOB  Transfers Overall transfer level: Needs assistance Equipment used: Rolling walker (2 wheeled) Transfers: Sit to/from Stand Sit to Stand: Min guard         General transfer comment: Min guard assist for safety; cues for hand placement     Balance Overall balance assessment: Needs assistance Sitting-balance support: No upper extremity supported;Feet supported Sitting balance-Leahy Scale: Fair     Standing balance support: Bilateral upper extremity supported;During functional activity Standing  balance-Leahy Scale: Poor Standing balance comment: Reliant on UE support                           ADL either performed or assessed with clinical judgement   ADL Overall ADL's : Needs assistance/impaired Eating/Feeding: Independent;Sitting   Grooming: Oral care;Set up;Sitting   Upper Body Bathing: Set up;Supervision/ safety;Sitting   Lower Body Bathing: Minimal assistance;Sit to/from stand   Upper Body Dressing : Set up;Supervision/safety;Sitting   Lower Body Dressing: Maximal assistance;Sit to/from stand Lower Body Dressing Details (indicate cue type and reason): Currently requiring increased assitance for LB dressing due to pain Toilet Transfer: Minimal assistance;Ambulation;BSC;RW Toilet Transfer Details (indicate cue type and reason): Min A to power up and for gaining balance         Functional mobility during ADLs: Minimal assistance;Rolling walker General ADL Comments: Pt demonstrating decreased strength and balance. highly motivated to participate      Vision         Perception     Praxis      Pertinent Vitals/Pain Pain Assessment: 0-10 Pain Score: 6  Pain Location: R knee Pain Descriptors / Indicators: Aching Pain Intervention(s): Monitored during session;Limited activity within patient's tolerance;Repositioned     Hand Dominance Right   Extremity/Trunk Assessment Upper Extremity Assessment Upper Extremity Assessment: Overall WFL for tasks assessed   Lower Extremity Assessment Lower Extremity Assessment: Defer to PT evaluation RLE Deficits / Details: Grossly decr AROM and strength, limited by pain postop; ROM approx 2-58deg       Communication Communication Communication: No difficulties   Cognition Arousal/Alertness: Awake/alert;Lethargic;Suspect due to medications Behavior During Therapy: [facility] for tasks assessed/performed Overall  Cognitive Status: Within Functional Limits for tasks assessed                                  General Comments: Slightly lethargic due to pain medication. Pt reporting "I feel so sleepy. And I know its just the pain medication. I can still do this."   General Comments       Exercises     Shoulder Instructions      Home Living Family/patient expects to be discharged to:: Private residence Living Arrangements: Spouse/significant other Available Help at Discharge: Family;Available 24 hours/day Type of Home: House Home Access: Stairs to enter [employer] of Steps: 3 Entrance Stairs-Rails: Can reach both Home Layout: One level     Bathroom Shower/Tub: Occupational psychologist: Standard     Home Equipment: Environmental consultant - 2 wheels;Shower seat;Bedside commode(Daughter in law bringing BSC)          Prior Functioning/Environment Level of Independence: Independent                 OT Problem List: Decreased strength;Decreased range of motion;Decreased activity tolerance;Impaired balance (sitting and/or standing);Decreased knowledge of use of DME or AE;Decreased knowledge of precautions;Pain      OT Treatment/Interventions: Self-care/ADL training;Therapeutic exercise;Energy conservation;DME and/or AE instruction;Therapeutic activities;Patient/family education    OT Goals(Current goals can be found in the care plan section) Acute Rehab OT Goals Patient Stated Goal: to walk without pain OT Goal Formulation: With patient Time For Goal Achievement: 09/05/18 Potential to Achieve Goals: Good  OT Frequency: Min 2X/week   Barriers to D/C:            Co-evaluation              AM-PAC OT "6 Clicks" Daily Activity     Outcome Measure Help from another person eating meals?: None Help from another person taking care of personal grooming?: None Help from another person toileting, which includes using toliet, bedpan, or urinal?: A Little Help from another person bathing (including washing, rinsing, drying)?: A Little Help from another person to put  on and taking off regular upper body clothing?: None Help from another person to put on and taking off regular lower body clothing?: A Lot 6 Click Score: 20   End of Session Equipment Utilized During Treatment: Gait belt;Rolling walker Nurse Communication: Mobility status;Weight bearing status  Activity Tolerance: Patient tolerated treatment well Patient left: in chair;with call bell/phone within reach  OT Visit Diagnosis: Unsteadiness on feet (R26.81);Other abnormalities of gait and mobility (R26.89);Muscle weakness (generalized) (M62.81);Pain Pain - Right/Left: Right Pain - part of body: Leg                Time: 0927-0952 OT Time Calculation (min): 25 min Charges:  OT General Charges $OT Visit: 1 Visit OT Evaluation $OT Eval Moderate Complexity: 1 Mod OT Treatments $Self Care/Home Management : 8-22 mins  Nahara Dona MSOT, OTR/L Acute Rehab Pager: [phone] Office: [place] 08/22/2018, 2:18 PM

## 2018-08-22 NOTE — Progress Notes (Signed)
Physical Therapy Treatment Patient Details Name: Kristina Burns MRN: [id number] DOB: [birth year] Today's Date: 08/22/2018    History of Present Illness Admitted for RTKA, WBAT;  has a past medical history of Arthritis, Asthma, and High cholesterol.    PT Comments    Continuing work on functional mobility and activity tolerance;  Very painful R knee with transitional movements; Still able to increase gait distance with progressive amb; Orthostatic BPs on the whole low, but negative for significant drop in BP with change in positions; Noted difficulty with R LE swing with gait; Su has decr R ankle active dorsiflexion -- Discussed with Sharyn Lull, RN and notifed Dr. Ninfa Linden  Follow Up Recommendations  Follow surgeon's recommendation for DC plan and follow-up therapies;Supervision/Assistance - 24 hour     Equipment Recommendations  Rolling walker with 5" wheels;3in1 (PT)(may already have)    Recommendations for Other Services       Precautions / Restrictions Precautions Precautions: Knee Precaution Booklet Issued: Yes (comment) Precaution Comments: Pt educated to not allow any pillow or bolster under knee for healing with optimal range of motion.  Restrictions Weight Bearing Restrictions: Yes RLE Weight Bearing: Weight bearing as tolerated    Mobility  Bed Mobility Overal bed mobility: Needs Assistance Bed Mobility: Sit to Supine     Supine to sit: Min assist;HOB elevated Sit to supine: Min assist   General bed mobility comments: Min assist to help RLE onto bed; very painful with transitional movement  Transfers Overall transfer level: Needs assistance Equipment used: Rolling walker (2 wheeled) Transfers: Sit to/from Stand Sit to Stand: Min guard         General transfer comment: Min guard assist for safety; cues for hand placement   Ambulation/Gait Ambulation/Gait assistance: Min guard Gait Distance (Feet): 85 Feet Assistive device: Rolling walker (2 wheeled) Gait  Pattern/deviations: Decreased dorsiflexion - right Gait velocity: quite slow   General Gait Details: Cues for sequence and to activate R quad for stance stabiltiy; noted tending to hip hike to advance RLE; with decr dorsiflexion R foot   Stairs             Wheelchair Mobility    Modified Rankin (Stroke Patients Only)       Balance Overall balance assessment: Needs assistance Sitting-balance support: No upper extremity supported;Feet supported Sitting balance-Leahy Scale: Fair     Standing balance support: Bilateral upper extremity supported;During functional activity Standing balance-Leahy Scale: Poor Standing balance comment: Reliant on UE support                            Cognition Arousal/Alertness: Awake/alert Behavior During Therapy: WFL for tasks assessed/performed Overall Cognitive Status: Within Functional Limits for tasks assessed                                 General Comments: Slightly lethargic due to pain medication. Pt reporting "I feel so sleepy. And I know its just the pain medication. I can still do this."      Exercises Total Joint Exercises Ankle Circles/Pumps: AROM;Left;10 reps;AAROM;Right;5 reps(including R calf strecth) Quad Sets: AROM;Right;10 reps Heel Slides: AAROM;Right;10 reps(very painful)    General Comments General comments (skin integrity, edema, etc.):   08/22/18 1300  Vital Signs  Patient Position (if appropriate) Orthostatic Vitals  Orthostatic Lying   BP- Lying 104/71  Pulse- Lying 67  Orthostatic Sitting  BP- Sitting 96/66  Pulse-  Sitting 74  Orthostatic Standing at 0 minutes  BP- Standing at 0 minutes 112/65  Pulse- Standing at 0 minutes 117  Orthostatic Standing at 3 minutes  BP- Standing at 3 minutes 101/79  Pulse- Standing at 3 minutes 75         Pertinent Vitals/Pain Pain Assessment: Faces Pain Score: 6  Faces Pain Scale: Hurts worst Pain Location: R knee with therex Pain  Descriptors / Indicators: Aching;Grimacing;Guarding Pain Intervention(s): Monitored during session    Home Living Family/patient expects to be discharged to:: Private residence Living Arrangements: Spouse/significant other Available Help at Discharge: Family;Available 24 hours/day Type of Home: House Home Access: Stairs to enter Entrance Stairs-Rails: Can reach both Home Layout: One level Home Equipment: Walker - 2 wheels;Shower seat;Bedside commode(Daughter in law bringing BSC)      Prior Function Level of Independence: Independent          PT Goals (current goals can now be found in the care plan section) Acute Rehab PT Goals Patient Stated Goal: to walk without pain PT Goal Formulation: With patient Time For Goal Achievement: 09/05/18 Potential to Achieve Goals: Good Progress towards PT goals: Progressing toward goals    Frequency    7X/week      PT Plan Current plan remains appropriate    Co-evaluation              AM-PAC PT "6 Clicks" Mobility   Outcome Measure  Help needed turning from your back to your side while in a flat bed without using bedrails?: None Help needed moving from lying on your back to sitting on the side of a flat bed without using bedrails?: A Little Help needed moving to and from a bed to a chair (including a wheelchair)?: A Little Help needed standing up from a chair using your arms (e.g., wheelchair or bedside chair)?: A Little Help needed to walk in hospital room?: A Little Help needed climbing 3-5 steps with a railing? : A Little 6 Click Score: 19    End of Session Equipment Utilized During Treatment: Gait belt Activity Tolerance: Patient tolerated treatment well Patient left: in bed;with call bell/phone within reach;with family/visitor present Nurse Communication: Mobility status PT Visit Diagnosis: Unsteadiness on feet (R26.81);Other abnormalities of gait and mobility (R26.89);Pain Pain - Right/Left: Right Pain - part of  body: Knee     Time: 1415-1455 PT Time Calculation (min) (ACUTE ONLY): 40 min  Charges:  $Gait Training: 8-22 mins $Therapeutic Exercise: 8-22 mins $Therapeutic Activity: 8-22 mins                     Roney Marion, PT  Acute Rehabilitation Services Pager [phone] Office [place] 08/22/2018, 4:27 PM

## 2018-08-22 NOTE — Progress Notes (Signed)
Patient ID: Kristina Burns, female   DOB: [birth year], 73 y.o.   MRN: [id number] Therapy went well today.  It was noted that she seems to have foot drop on her operative side.  She is quite weak on my exam and cannot dorsiflex her right foot.  She can barely extend her toes, so hopefully should get full recovery with time.  Will order an AFO.

## 2018-08-22 NOTE — Evaluation (Signed)
Physical Therapy Evaluation Patient Details Name: Kristina Burns MRN: [id number] DOB: 05-Jul-1946 Today's Date: 08/22/2018   History of Present Illness  Admitted for RTKA, WBAT;  has a past medical history of Arthritis, Asthma, and High cholesterol.  Clinical Impression  Pt is s/p TKA resulting in the deficits listed below (see PT Problem List). Independent prior to admission; Presents with significant R knee pain, decr functional mobility, and decr activity tolerance; Did not get BPs this session, but with dizziness and less talking with more time in standing, it's possible that she was orthostatic; Pt will benefit from skilled PT to increase their independence and safety with mobility to allow discharge to the venue listed below.   Will plan to check orthostatics next session     Follow Up Recommendations Follow surgeon's recommendation for DC plan and follow-up therapies;Supervision/Assistance - 24 hour    Equipment Recommendations  Rolling walker with 5" wheels;3in1 (PT)(may already have)    Recommendations for Other Services       Precautions / Restrictions Precautions Precautions: Knee Precaution Booklet Issued: Yes (comment) Precaution Comments: Pt educated to not allow any pillow or bolster under knee for healing with optimal range of motion.  Restrictions Weight Bearing Restrictions: Yes RLE Weight Bearing: Weight bearing as tolerated      Mobility  Bed Mobility                  Transfers Overall transfer level: Needs assistance Equipment used: Rolling walker (2 wheeled) Transfers: Sit to/from Stand Sit to Stand: Min guard         General transfer comment: Minguard assist for safety; cues for hand placement   Ambulation/Gait Ambulation/Gait assistance: Min guard Gait Distance (Feet): 20 Feet Assistive device: Rolling walker (2 wheeled) Gait Pattern/deviations: Decreased stance time - right;Antalgic Gait velocity: quite slow   General Gait Details:  Cues for gait sequence and to self-monitor for activity tolerance; noted more tired and less responsive with incr time upright; resembling orthostasis  Stairs            Wheelchair Mobility    Modified Rankin (Stroke Patients Only)       Balance Overall balance assessment: Needs assistance           Standing balance-Leahy Scale: Poor Standing balance comment: Reliant on UE support                             Pertinent Vitals/Pain Pain Assessment: 0-10 Pain Score: 7  Pain Location: R knee Pain Descriptors / Indicators: Aching Pain Intervention(s): Monitored during session    Home Living Family/patient expects to be discharged to:: Private residence Living Arrangements: Spouse/significant other Available Help at Discharge: Family;Available 24 hours/day Type of Home: House Home Access: Stairs to enter Entrance Stairs-Rails: Can reach both Entrance Stairs-Number of Steps: 3 Home Layout: One level Home Equipment: Walker - 2 wheels;Shower seat;Bedside commode(Daughter in law bringing BSC)      Prior Function Level of Independence: Independent               Hand Dominance   Dominant Hand: Right    Extremity/Trunk Assessment   Upper Extremity Assessment Upper Extremity Assessment: Defer to OT evaluation    Lower Extremity Assessment Lower Extremity Assessment: RLE deficits/detail RLE Deficits / Details: Grossly decr AROM and strength, limited by pain postop; ROM approx 2-58deg       Communication   Communication: No difficulties  Cognition Arousal/Alertness: Awake/alert Behavior During  Therapy: WFL for tasks assessed/performed Overall Cognitive Status: Within Functional Limits for tasks assessed                                        General Comments      Exercises     Assessment/Plan    PT Assessment Patient needs continued PT services  PT Problem List Decreased strength;Decreased range of motion;Decreased  activity tolerance;Decreased balance;Decreased mobility;Decreased coordination;Decreased knowledge of use of DME;Decreased safety awareness;Decreased knowledge of precautions;Pain       PT Treatment Interventions DME instruction;Gait training;Stair training;Functional mobility training;Therapeutic activities;Therapeutic exercise;Balance training;Patient/family education    PT Goals (Current goals can be found in the Care Plan section)  Acute Rehab PT Goals Patient Stated Goal: to walk without pain PT Goal Formulation: With patient Time For Goal Achievement: 09/05/18 Potential to Achieve Goals: Good    Frequency 7X/week   Barriers to discharge        Co-evaluation               AM-PAC PT "6 Clicks" Mobility  Outcome Measure Help needed turning from your back to your side while in a flat bed without using bedrails?: None Help needed moving from lying on your back to sitting on the side of a flat bed without using bedrails?: A Little Help needed moving to and from a bed to a chair (including a wheelchair)?: A Little Help needed standing up from a chair using your arms (e.g., wheelchair or bedside chair)?: A Little Help needed to walk in hospital room?: A Little Help needed climbing 3-5 steps with a railing? : A Little 6 Click Score: 19    End of Session Equipment Utilized During Treatment: Gait belt Activity Tolerance: Other (comment)(likely limited by orthostatic hypotension) Patient left: in chair;with call bell/phone within reach Nurse Communication: Mobility status PT Visit Diagnosis: Unsteadiness on feet (R26.81);Other abnormalities of gait and mobility (R26.89);Pain Pain - Right/Left: Right Pain - part of body: Knee    Time: 8937-3428 PT Time Calculation (min) (ACUTE ONLY): 23 min   Charges:   PT Evaluation $PT Eval Moderate Complexity: 1 Mod PT Treatments $Gait Training: 8-22 mins        Roney Marion, PT  Acute Rehabilitation Services Pager  [phone] Office [place] 08/22/2018, 12:19 PM

## 2018-08-23 DIAGNOSIS — M1711 Unilateral primary osteoarthritis, right knee: Secondary | ICD-10-CM | POA: Diagnosis not present

## 2018-08-23 DIAGNOSIS — M21371 Foot drop, right foot: Secondary | ICD-10-CM | POA: Diagnosis not present

## 2018-08-23 DIAGNOSIS — E7800 Pure hypercholesterolemia, unspecified: Secondary | ICD-10-CM | POA: Diagnosis not present

## 2018-08-23 DIAGNOSIS — G8929 Other chronic pain: Secondary | ICD-10-CM | POA: Diagnosis not present

## 2018-08-23 DIAGNOSIS — Z79899 Other long term (current) drug therapy: Secondary | ICD-10-CM | POA: Diagnosis not present

## 2018-08-23 MED ORDER — OXYCODONE HCL 5 MG PO TABS: 5.0000 mg | ORAL_TABLET | ORAL | 0 refills | Status: DC | PRN

## 2018-08-23 MED ORDER — METHOCARBAMOL 500 MG PO TABS: 500.0000 mg | ORAL_TABLET | Freq: Four times a day (QID) | ORAL | 1 refills | Status: DC | PRN

## 2018-08-23 MED ORDER — RIVAROXABAN 10 MG PO TABS: 10.0000 mg | ORAL_TABLET | Freq: Every day | ORAL | 0 refills | Status: DC

## 2018-08-23 NOTE — Progress Notes (Signed)
Physical Therapy Treatment Note  Patient seen for mobility progression. Pt tolerated short distance gait training and stair training with c/o 4/10 pain. Pt requires min guard/min A for gait and min/mod A to ascend/descend  Stairs simulating home entrance. Pt continues to present with decreased R dorsiflexion and hip hiking. Hopefully with AFO pt will compensate less with hip flexors for advancement of R LE. Continue to progress as tolerated.    08/23/18 1043  PT Visit Information  Last PT Received On 08/23/18  Assistance Needed +1  History of Present Illness Admitted for RTKA, WBAT;  has a past medical history of Arthritis, Asthma, and High cholesterol.  Subjective Data  Patient Stated Goal to walk without pain  Precautions  Precautions Knee  Precaution Booklet Issued Yes (comment)  Precaution Comments precautions/positioning reviewed with pt  Restrictions  Weight Bearing Restrictions Yes  RLE Weight Bearing WBAT  Pain Assessment  Pain Assessment 0-10  Pain Score 4  Pain Location R knee  Pain Descriptors / Indicators Grimacing;Guarding;Moaning;Sore  Pain Intervention(s) Limited activity within patient's tolerance;Monitored during session;Premedicated before session;Repositioned  Cognition  Arousal/Alertness Awake/alert  Behavior During Therapy WFL for tasks assessed/performed  Overall Cognitive Status Within Functional Limits for tasks assessed  Bed Mobility  Overal bed mobility Needs Assistance  Bed Mobility Supine to Sit  Supine to sit Min assist;HOB elevated  General bed mobility comments cues for sequencing; assist to bring R LE to EOB; increased time and effort and use of rails needed  Transfers  Overall transfer level Needs assistance  Equipment used Rolling walker (2 wheeled)  Transfers Sit to/from Stand  Sit to Stand Min guard  General transfer comment Min guard assist for safety; cues for hand placement   Ambulation/Gait  Ambulation/Gait assistance Min guard;Min  assist  Gait Distance (Feet)  (~28 ft total)  Assistive device Rolling walker (2 wheeled)  Gait Pattern/deviations Decreased dorsiflexion - right;Step-through pattern;Trunk flexed (hip hiking on R side)  General Gait Details multimodal cues for R knee flexion during swing phase, sequencing, posture, and safe proximity to RW; pt continues to hip hike to advance R LE despite cues for knee flexion to attempt follow through; decreased R dorsiflexion  Gait velocity quite slow  Stairs Yes  Stairs assistance Min assist;Mod assist  Stair Management Two rails;Step to pattern;Forwards  Number of Stairs 2  General stair comments cues for sequencing and technique; mod A to ascend and min A to descend  Balance  Overall balance assessment Needs assistance  Sitting-balance support No upper extremity supported;Feet supported  Sitting balance-Leahy Scale Fair  Standing balance support Bilateral upper extremity supported;During functional activity  Standing balance-Leahy Scale Poor  Standing balance comment Reliant on UE support  General Comments  General comments (skin integrity, edema, etc.) family present  PT - End of Session  Equipment Utilized During Treatment Gait belt  Activity Tolerance Patient tolerated treatment well  Patient left with call bell/phone within reach;with family/visitor present;in chair;Other (comment) (pt with OT in ortho gym)  Nurse Communication Mobility status   PT - Assessment/Plan  PT Plan Current plan remains appropriate  PT Visit Diagnosis Unsteadiness on feet (R26.81);Other abnormalities of gait and mobility (R26.89);Pain  Pain - Right/Left Right  Pain - part of body Knee  PT Frequency (ACUTE ONLY) 7X/week  Follow Up Recommendations Follow surgeon's recommendation for DC plan and follow-up therapies;Supervision/Assistance - 24 hour  PT equipment Other (comment) (pt reports having equipment needed)  AM-PAC PT "6 Clicks" Mobility Outcome Measure (Version 2)  Help  needed turning from your back to your side while in a flat bed without using bedrails? 3  Help needed moving from lying on your back to sitting on the side of a flat bed without using bedrails? 3  Help needed moving to and from a bed to a chair (including a wheelchair)? 3  Help needed standing up from a chair using your arms (e.g., wheelchair or bedside chair)? 3  Help needed to walk in hospital room? 3  Help needed climbing 3-5 steps with a railing?  3  6 Click Score 18  Consider Recommendation of Discharge To: Home with HH  PT Goal Progression  Progress towards PT goals Progressing toward goals  PT Time Calculation  PT Start Time (ACUTE ONLY) 1002  PT Stop Time (ACUTE ONLY) 1040  PT Time Calculation (min) (ACUTE ONLY) 38 min  PT General Charges  $$ ACUTE PT VISIT 1 Visit  PT Treatments  $Gait Training 23-37 mins   Earney Navy, PTA Acute Rehabilitation Services Pager: [phone] Office: [phone]

## 2018-08-23 NOTE — Progress Notes (Signed)
Pt given discharge instructions and gone over with her and daughter present. Belongings gathered to be sent home. Ice packs sent home.

## 2018-08-23 NOTE — Progress Notes (Signed)
Physical Therapy Treatment Patient Details Name: Kristina Burns MRN: [id number] DOB: [birth year] Today's Date: 08/23/2018    History of Present Illness Admitted for RTKA, WBAT;  has a past medical history of Arthritis, Asthma, and High cholesterol.    PT Comments    Patient seen for gait and stair training second time with R AFO donned. Pt continues to hip hike and circumduct R LE however foot clearance improved. Pt educated extensively on gait mechanics through swing phase and multimodal cues/assistance required for R knee flexion. Pt is demonstrating improved passive R knee flexion and able to tolerate better when sitting/standing. Given HEP handout and encouraged to ambulate every hour upon d/c home. Positioning and knee precautions reviewed with pt and pt's daughter as well as doffing AFO when resting and checking skin after use. Current plan remains appropriate.    Follow Up Recommendations  Follow surgeon's recommendation for DC plan and follow-up therapies;Supervision/Assistance - 24 hour     Equipment Recommendations  Other (comment)(pt reports having equipment needed)    Recommendations for Other Services       Precautions / Restrictions Precautions Precautions: Knee Precaution Booklet Issued: Yes (comment) Precaution Comments: precautions/positioning reviewed with pt and pt's daughter  Restrictions Weight Bearing Restrictions: Yes RLE Weight Bearing: Weight bearing as tolerated    Mobility  Bed Mobility               General bed mobility comments: pt OOB in chair upon arrival  Transfers Overall transfer level: Needs assistance Equipment used: Rolling walker (2 wheeled) Transfers: Sit to/from Stand Sit to Stand: Min guard         General transfer comment: Min guard assist for safety; cues for hand placement   Ambulation/Gait Ambulation/Gait assistance: Min guard Gait Distance (Feet): 80 Feet Assistive device: Rolling walker (2 wheeled) Gait  Pattern/deviations: Decreased dorsiflexion - right;Step-through pattern;Trunk flexed(hip hiking and circumduction on R side) Gait velocity: quite slow   General Gait Details: multimodal cues for R knee flexion during swing phase, sequencing, posture, and safe proximity to RW; pt physcially lead through gait cycle on R LE however pt unable to demonstrate carry over   [employer] assistance: Min assist Stair Management: Two rails;Step to pattern;Forwards   General stair comments: carry over of sequencing and technique demonstrated and less assistance required with AFO donned   Wheelchair Mobility    Modified Rankin (Stroke Patients Only)       Balance Overall balance assessment: Needs assistance Sitting-balance support: No upper extremity supported;Feet supported Sitting balance-Leahy Scale: Fair     Standing balance support: Bilateral upper extremity supported;During functional activity Standing balance-Leahy Scale: Poor Standing balance comment: Reliant on UE support                            Cognition Arousal/Alertness: Awake/alert Behavior During Therapy: WFL for tasks assessed/performed Overall Cognitive Status: Within Functional Limits for tasks assessed                                        Exercises      General Comments General comments (skin integrity, edema, etc.): daughter present; given HEP handout       Pertinent Vitals/Pain Pain Assessment: Faces Faces Pain Scale: Hurts little more Pain Location: R knee Pain Descriptors / Indicators: Guarding;Moaning;Sore Pain Intervention(s): Limited activity within patient's tolerance;Monitored during session;Premedicated before  session;Repositioned    Home Living                      Prior Function            PT Goals (current goals can now be found in the care plan section) Acute Rehab PT Goals Patient Stated Goal: to walk without pain Progress towards PT goals:  Progressing toward goals    Frequency    7X/week      PT Plan Current plan remains appropriate    Co-evaluation              AM-PAC PT "6 Clicks" Mobility   Outcome Measure  Help needed turning from your back to your side while in a flat bed without using bedrails?: A Little Help needed moving from lying on your back to sitting on the side of a flat bed without using bedrails?: A Little Help needed moving to and from a bed to a chair (including a wheelchair)?: A Little Help needed standing up from a chair using your arms (e.g., wheelchair or bedside chair)?: A Little Help needed to walk in hospital room?: A Little Help needed climbing 3-5 steps with a railing? : A Little 6 Click Score: 18    End of Session Equipment Utilized During Treatment: Gait belt Activity Tolerance: Patient tolerated treatment well Patient left: with call bell/phone within reach;with family/visitor present;in chair Nurse Communication: Mobility status PT Visit Diagnosis: Unsteadiness on feet (R26.81);Other abnormalities of gait and mobility (R26.89);Pain Pain - Right/Left: Right Pain - part of body: Knee     Time: 1415-1500 PT Time Calculation (min) (ACUTE ONLY): 45 min  Charges:  $Gait Training: 38-52 mins                     Earney Navy, PTA Acute Rehabilitation Services Pager: [phone] Office: [phone]     Darliss Cheney 08/23/2018, 3:24 PM

## 2018-08-23 NOTE — Progress Notes (Signed)
Subjective: 2 Days Post-Op Procedure(s) (LRB): RIGHT TOTAL KNEE ARTHROPLASTY (Right) Patient reports pain as mild.  States pain improved today. Has ASO on right ankle.   Objective: Vital signs in last 24 hours: Temp:  [98 F (36.7 C)-99.7 F (37.6 C)] 98.8 F (37.1 C) (01/16 0300) Pulse Rate:  [63-72] 72 (01/16 0349) Resp:  [15-20] 15 (01/16 0349) BP: (94-103)/(53-61) 97/53 (01/16 0349) SpO2:  [94 %-97 %] 94 % (01/16 0349)  Intake/Output from previous day: 01/15 0701 - 01/16 0700 In: 907.8 [P.O.:250; I.V.:657.8] Out: -  Intake/Output this shift: No intake/output data recorded.  Recent Labs    08/22/18 0157  HGB 11.3*   Recent Labs    08/22/18 0157  WBC 9.2  RBC 3.82*  HCT 34.5*  PLT 191   Recent Labs    08/22/18 0157  NA 141  K 4.5  CL 105  CO2 26  BUN 13  CREATININE 0.72  GLUCOSE 139*  CALCIUM 9.1   No results for input(s): LABPT, INR in the last 72 hours.  Neurovascular intact Incision: dressing C/D/I Compartment soft Able to extend right toes unable to dorsiflex ankle.    Assessment/Plan: 2 Days Post-Op Procedure(s) (LRB): RIGHT TOTAL KNEE ARTHROPLASTY (Right) Up with therapy  Needs AFO right ankle not ASO will call ortho tech Possible discharge to home this afternoon if does wee with PT     Kristina Burns 08/23/2018, 10:03 AM

## 2018-08-23 NOTE — Discharge Summary (Signed)
Patient ID: Kristina Burns MRN: [id number] DOB/AGE: November 16, 1945 73 y.o.  Admit date: 08/21/2018 Discharge date: 08/23/2018  Admission Diagnoses:  Principal Problem:   Unilateral primary osteoarthritis, right knee Active Problems:   Status post total knee replacement, right   Discharge Diagnoses:   Status post total knee replacement, right Post -op foot drop right   Past Medical History:  Diagnosis Date  . Arthritis   . Asthma   . High cholesterol     Surgeries: Procedure(s): RIGHT TOTAL KNEE ARTHROPLASTY on 08/21/2018   Consultants:   Discharged Condition: Improved  Hospital Course: Tabbitha Janvrin is an 73 y.o. female who was admitted 08/21/2018 for operative treatment ofUnilateral primary osteoarthritis, right knee. Patient has severe unremitting pain that affects sleep, daily activities, and work/hobbies. After pre-op clearance the patient was taken to the operating room on 08/21/2018 and underwent  Procedure(s): RIGHT TOTAL KNEE ARTHROPLASTY.    Patient was given perioperative antibiotics:  Anti-infectives (From admission, onward)   Start     Dose/Rate Route Frequency Ordered Stop   08/21/18 2200  ceFAZolin (ANCEF) IVPB 1 g/50 mL premix     1 g 100 mL/hr over 30 Minutes Intravenous Every 6 hours 08/21/18 2130 08/22/18 0329   08/21/18 1130  ceFAZolin (ANCEF) IVPB 2g/100 mL premix     2 g 200 mL/hr over 30 Minutes Intravenous On call to O.R. 08/21/18 1122 08/21/18 1443   08/21/18 1123  ceFAZolin (ANCEF) 2-4 GM/100ML-% IVPB    Note to Pharmacy:  Merryl Hacker   : cabinet override      08/21/18 1123 08/21/18 1438       Patient was given sequential compression devices, early ambulation, and chemoprophylaxis to prevent DVT.  Patient benefited maximally from hospital stay and she did develop a right foot drop. AFO applied to right foot.   Recent vital signs:  Patient Vitals for the past 24 hrs:  BP Temp Temp src Pulse Resp SpO2  08/23/18 0349 (!) 97/53 - - 72 15 94  %  08/23/18 0300 - 98.8 F (37.1 C) - - - -  08/22/18 1959 94/61 99.7 F (37.6 C) Oral 63 20 95 %  08/22/18 1215 (!) 103/58 - - 71 19 97 %  08/22/18 1213 - 98 F (36.7 C) Axillary - - -     Recent laboratory studies:  Recent Labs    08/22/18 0157  WBC 9.2  HGB 11.3*  HCT 34.5*  PLT 191  NA 141  K 4.5  CL 105  CO2 26  BUN 13  CREATININE 0.72  GLUCOSE 139*  CALCIUM 9.1     Discharge Medications:   Allergies as of 08/23/2018      Reactions   Aspirin Swelling   Lips swell up.  She hasn't taken in awhile      Medication List    STOP taking these medications   acetaminophen 500 MG tablet Commonly known as:  TYLENOL     TAKE these medications   atorvastatin 10 MG tablet Commonly known as:  LIPITOR Take 10 mg by mouth daily.   BREO ELLIPTA 100-25 MCG/INH Aepb Generic drug:  fluticasone furoate-vilanterol Inhale 1 puff into the lungs daily as needed (for shortness of breath or wheezing).   clobetasol cream 0.05 % Commonly known as:  TEMOVATE Apply 1 application topically daily as needed (will just take as needed).   loratadine 10 MG tablet Commonly known as:  CLARITIN Take 10 mg by mouth daily.   methocarbamol 500 MG tablet Commonly known  as:  ROBAXIN Take 1 tablet (500 mg total) by mouth every 6 (six) hours as needed for muscle spasms.   oxyCODONE 5 MG immediate release tablet Commonly known as:  Oxy IR/ROXICODONE Take 1-2 tablets (5-10 mg total) by mouth every 4 (four) hours as needed for moderate pain (pain score 4-6).   pregabalin 75 MG capsule Commonly known as:  LYRICA Take 75 mg by mouth daily.   rivaroxaban 10 MG Tabs tablet Commonly known as:  XARELTO Take 1 tablet (10 mg total) by mouth daily with breakfast. Start taking on:  August 24, 2018   VITAMIN B-12 PO Take 1 tablet by mouth daily.   VITAMIN D3 PO Take 1 capsule by mouth daily.            Durable Medical Equipment  (From admission, onward)         Start     Ordered    08/21/18 2131  DME 3 n 1  Once     08/21/18 2130   08/21/18 2131  DME Walker rolling  Once    Question:  Patient needs a walker to treat with the following condition  Answer:  Status post total knee replacement, right   08/21/18 2130          Diagnostic Studies: Dg Knee Right Port  Result Date: 08/21/2018 CLINICAL DATA:  Status post right total knee replacement. EXAM: PORTABLE RIGHT KNEE - 1-2 VIEW COMPARISON:  None. FINDINGS: AP and lateral views of the right knee demonstrate a total knee prosthesis in satisfactory position and alignment. No fracture or dislocation seen. IMPRESSION: Satisfactory postoperative appearance of a right total knee prosthesis. Electronically Signed   By: Claudie Revering M.D.   On: 08/21/2018 17:10    Disposition:     Follow-up Information    [facility] up.   Specialty:  Home Health Services Why:  for home health services. they will contact you in the next 1-2 days to set up your first home visit Contact information: [place] [address] [phone]        Mcarthur Rossetti, MD. Schedule an appointment as soon as possible for a visit in 2 week(s).   Specialty:  Orthopedic Surgery Contact information: [place] [address] [phone]            Signed: Erskine Emery 08/23/2018, 10:11 AM

## 2018-08-23 NOTE — Progress Notes (Signed)
Occupational Therapy Treatment Patient Details Name: Kristina Burns MRN: [id number] DOB: [birth year] Today's Date: 08/23/2018    History of present illness Admitted for RTKA, WBAT;  has a past medical history of Arthritis, Asthma, and High cholesterol.   OT comments  Pt progressing toward OT goals. Pt engaged in LB dressing required Max A with use of AE. Due to increased pain, pt unable to don sock completely but able to demonstrate steps of using sock aide. Therapist demonstrated walk in shower transfer, due to increased R knee pain, with shower bench to replicate home setting. Pt able to recall steps with verbal responses. Next session to address shower transfer with teach back and demonstration. Pt will continue OT session to progress towards PLOF and return to home setting with a safe transition and independence in most self care tasks.    Follow Up Recommendations  Follow surgeon's recommendation for DC plan and follow-up therapies;Supervision/Assistance - 24 hour    Equipment Recommendations  None recommended by OT    Recommendations for Other Services PT consult    Precautions / Restrictions Precautions Precautions: Knee Precaution Booklet Issued: Yes (comment) Precaution Comments:  Restrictions Weight Bearing Restrictions: Yes RLE Weight Bearing: Weight bearing as tolerated       Mobility Bed Mobility Overal bed mobility: Needs Assistance Bed Mobility: Sit to Supine(Simultaneous filing. User may not have seen previous data.)     Supine to sit: Min assist;HOB elevated Sit to supine: Min assist(Simultaneous filing. User may not have seen previous data.)   General bed mobility comments: Min assist to help RLE onto bed; very painful with transitional movement(Simultaneous filing. User may not have seen previous data.)  Transfers Overall transfer level: Needs assistance Equipment used: Rolling walker (2 wheeled) Transfers: Sit to/from Stand Sit to Stand: Min guard          General transfer comment: Min guard assist for safety; cues for hand placement for stand to sit in recliner.     Balance Overall balance assessment: Needs assistance Sitting-balance support: No upper extremity supported;Feet supported Sitting balance-Leahy Scale: Fair     Standing balance support: Bilateral upper extremity supported;During functional activity Standing balance-Leahy Scale: Poor Standing balance comment: Good BUE support with RW.                           ADL either performed or assessed with clinical judgement   ADL Overall ADL's : Needs assistance/impaired             Lower Body Bathing: Minimal assistance;Sit to/from stand       Lower Body Dressing: Maximal assistance;Sit to/from stand;Cueing for sequencing;With adaptive equipment Lower Body Dressing Details (indicate cue type and reason): Demonstration of donning sock with sock aide. Currently requiring increased assitance for LB dressing due to pain. Pt unable to fully demonstrated learned stragtegy with sock due to increased pain.  Toilet Transfer: Minimal assistance;Ambulation;BSC;RW       Tub/ Shower Transfer: Copy Details (indicate cue type and reason): Due to increased pain, therapist demonstrated shower transfer with shower bench to emulate home set. Next session to focus on pt demonstrating with teach back and verbal instructions.  Functional mobility during ADLs: Minimal assistance;Rolling walker General ADL Comments: Pt demonstrating decreased strength and balance. Demonstrated good sequence in transition from stand to sit with RW.     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 Pt reporting "I feel so sleepy. And I know its just the pain medication. I can still do this."(Simultaneous filing. User may not have seen previous data.)        Exercises Exercises: Total Joint(Simultaneous filing. User may not have seen previous data.) Total Joint Exercises Ankle Circles/Pumps: (including R calf strecth) Quad Sets: AROM;Right;10 reps(Simultaneous filing. User may not have seen previous data.) Heel Slides: (very painful)   Shoulder Instructions       General Comments family present    Pertinent Vitals/ Pain       Pain Assessment: 0-10 Pain Score: 4  Faces Pain Scale: Hurts worst(Simultaneous filing. User may not have seen previous data.) Pain Location: R knee Pain Descriptors / Indicators: Aching;Grimacing;Guarding;Moaning Pain Intervention(s): Monitored during session;Repositioned(Breathing technique)  Home Living                                          Prior Functioning/Environment              Frequency  Min 2X/week        Progress Toward Goals  OT Goals(current goals can now be found in the care plan section)     Acute Rehab OT Goals Patient Stated Goal: to walk without pain OT Goal Formulation: With patient Time For Goal Achievement: 09/05/18 Potential to Achieve Goals: Good ADL Goals Pt Will Perform Lower Body Dressing: with set-up;with supervision;sit to/from stand Pt Will Perform Tub/Shower Transfer: with supervision;with set-up;ambulating;shower seat;rolling walker;Shower transfer  Plan Discharge plan remains appropriate    Co-evaluation                 AM-PAC OT "6 Clicks" Daily Activity     Outcome Measure   Help from another person eating meals?: None Help from another person taking care of personal grooming?: None Help from another person toileting, which includes using toliet, bedpan, or urinal?: A Little Help from another person bathing (including washing, rinsing, drying)?: A  Little Help from another person to put on and taking off regular upper body clothing?: None Help from another person to put on and taking off regular lower body clothing?: A Lot 6 Click Score: 20    End of Session Equipment Utilized During Treatment: Gait belt;Rolling walker CPM Right Knee CPM Right Knee: Off Additional Comments: she did well  OT Visit Diagnosis: Unsteadiness on feet (R26.81);Other abnormalities of gait and mobility (R26.89);Muscle weakness (generalized) (M62.81);Pain Pain - Right/Left: Right Pain - part of body: Leg   Activity Tolerance Patient tolerated treatment well   Patient Left in chair;with call bell/phone within reach   Nurse Communication Mobility status;Weight bearing status        Time: 1040-1056 OT Time Calculation (min): 16 min  Charges: OT General Charges $OT Visit: 1 Visit OT Treatments $Self Care/Home Management : 8-22 mins  Minus Breeding, MSOT, OTR/L  Supplemental Rehabilitation Services  [phone]   Marius Ditch 08/23/2018, 11:14 AM

## 2018-08-23 NOTE — Plan of Care (Signed)
  Problem: Pain Management: Goal: Pain level will decrease with appropriate interventions Outcome: Progressing   

## 2018-08-23 NOTE — Progress Notes (Signed)
Orthopedic Tech Progress Note Patient Details:  Kristina Burns 1946-02-15 [id number]  CPM Right Knee CPM Right Knee: Off Right Knee Flexion (Degrees): 90 Right Knee Extension (Degrees): 0 Additional Comments: she did well  Post Interventions Patient Tolerated: Well Instructions Provided: Care of device, Adjustment of device Ortho Devices Type of Ortho Device: ASO Ortho Device/Splint Location: right ankle Ortho Device/Splint Interventions: Adjustment, Application, Ordered   Post Interventions Patient Tolerated: Well Instructions Provided: Care of device, Adjustment of device  Patient ID: Kristina Burns, female   DOB: [birth year], 73 y.o.   MRN: [id number]   [name] 08/23/2018, 8:14 AM

## 2018-08-24 ENCOUNTER — Telehealth (INDEPENDENT_AMBULATORY_CARE_PROVIDER_SITE_OTHER): Payer: Self-pay | Admitting: Orthopaedic Surgery

## 2018-08-24 ENCOUNTER — Encounter (HOSPITAL_COMMUNITY): Payer: Self-pay | Admitting: Orthopaedic Surgery

## 2018-08-24 NOTE — Telephone Encounter (Signed)
[name]-(PT) from [facility] called needing verbal orders for HHPT 1 Wk 1 and 3 Wk 4.  The number to contact [name] is [phone]

## 2018-08-24 NOTE — Anesthesia Postprocedure Evaluation (Signed)
Anesthesia Post Note  Patient: Kristina Burns  Procedure(s) Performed: RIGHT TOTAL KNEE ARTHROPLASTY (Right Knee)     Patient location during evaluation: PACU Anesthesia Type: MAC, Spinal and Regional Level of consciousness: oriented and awake and alert Pain management: pain level controlled Vital Signs Assessment: post-procedure vital signs reviewed and stable Respiratory status: spontaneous breathing, respiratory function stable and patient connected to nasal cannula oxygen Cardiovascular status: blood pressure returned to baseline and stable Postop Assessment: no headache, no backache and no apparent nausea or vomiting Anesthetic complications: no    Last Vitals:  Vitals:   08/23/18 0349 08/23/18 1359  BP: (!) 97/53 109/62  Pulse: 72 87  Resp: 15   Temp:  36.8 C  SpO2: 94% 99%    Last Pain:  Vitals:   08/23/18 1359  TempSrc: Oral  PainSc:                  Shamell Suarez S

## 2018-08-27 ENCOUNTER — Telehealth (INDEPENDENT_AMBULATORY_CARE_PROVIDER_SITE_OTHER): Payer: Self-pay | Admitting: Orthopaedic Surgery

## 2018-08-27 MED ORDER — OXYCODONE HCL 5 MG PO TABS: 5.0000 mg | ORAL_TABLET | ORAL | 0 refills | Status: DC | PRN

## 2018-08-27 NOTE — Telephone Encounter (Signed)
[name]-(PT) from [facility] called needing verbal orders for HHPT 1 Wk 1 and 3 Wk 4. [name] said she saw patient Friday. The number to contact patient is [phone]

## 2018-08-27 NOTE — Telephone Encounter (Signed)
I sent some in 

## 2018-08-27 NOTE — Telephone Encounter (Signed)
Verbal order given  

## 2018-08-27 NOTE — Telephone Encounter (Signed)
Please advise 

## 2018-08-27 NOTE — Telephone Encounter (Signed)
Patient left a message requesting a RX refill on her Oxycodone.  Patient would like a call back when the medication has been called into the pharmacy.  CB#[phone].  Thank you.

## 2018-09-04 ENCOUNTER — Ambulatory Visit (INDEPENDENT_AMBULATORY_CARE_PROVIDER_SITE_OTHER): Payer: Medicare Other | Admitting: Orthopaedic Surgery

## 2018-09-04 ENCOUNTER — Other Ambulatory Visit (INDEPENDENT_AMBULATORY_CARE_PROVIDER_SITE_OTHER): Payer: Self-pay

## 2018-09-04 ENCOUNTER — Ambulatory Visit (HOSPITAL_COMMUNITY)
Admission: RE | Admit: 2018-09-04 | Discharge: 2018-09-04 | Disposition: A | Discharge status: Home / Self Care | Payer: Medicare Other | Source: Ambulatory Visit | Attending: Orthopaedic Surgery | Admitting: Orthopaedic Surgery

## 2018-09-04 DIAGNOSIS — Z96651 Presence of right artificial knee joint: Secondary | ICD-10-CM | POA: Diagnosis not present

## 2018-09-04 MED ORDER — METHOCARBAMOL 500 MG PO TABS: 500.0000 mg | ORAL_TABLET | Freq: Four times a day (QID) | ORAL | 1 refills | Status: DC | PRN

## 2018-09-04 MED ORDER — OXYCODONE HCL 5 MG PO TABS: 5.0000 mg | ORAL_TABLET | ORAL | 0 refills | Status: AC | PRN

## 2018-09-04 NOTE — Progress Notes (Signed)
Office Visit Note   Patient: Kristina Burns           Date of Birth: 13-Feb-1946           MRN: [id number] Visit Date: 09/04/2018              Requested by: No referring provider defined for this encounter. PCP: System, Pcp Not In   Assessment & Plan: Visit Diagnoses:  1. Status post total right knee replacement     Plan: We will refill her pain medicine and muscle relaxant today.  Send her for a Doppler of her right lower leg to rule out DVT.  She will remain on Xarelto over the next couple of days may have to adjust this if she is positive for DVT.  Staples removed Steri-Strips applied.  Transition her to outpatient physical therapy for range of motion strengthening of the right knee.  Follow-up with [place] in a month sooner if there is any questions or concerns.  Follow-Up Instructions: Return in about 4 weeks (around 10/02/2018).   Orders:  No orders of the defined types were placed in this encounter.  No orders of the defined types were placed in this encounter.     Procedures: No procedures performed   Clinical Data: No additional findings.   Subjective: Chief Complaint  Patient presents with  . Right Knee - Routine Post Op    HPI Patient reports that she is progressing well with physical therapy.  She is complaining mostly of swelling in her right leg that is very painful whenever she takes off the compression stockings.  Pressure stockings do seem to help though.  She has had no shortness of breath fevers chills. Review of Systems Please see HPI otherwise normal  Objective: Vital Signs: There were no vitals taken for this visit.  Physical Exam Constitutional:      Appearance: She is not ill-appearing or diaphoretic.  Pulmonary:     Effort: Pulmonary effort is normal.  Neurological:     Mental Status: She is alert and oriented to person, place, and time.  Psychiatric:        Mood and Affect: Mood normal.     Ortho Exam Right knee full extension  flexion to 90 degrees.  Staples well approximate the wound no signs of infection.  Tenderness in the left calf no significant swelling.  She has significant ecchymosis about the knee.  She has full dorsiflexion plantarflexion of the right ankle. Specialty Comments:  No specialty comments available.  Imaging: No results found.   PMFS History: Patient Active Problem List   Diagnosis Date Noted  . Status post total knee replacement, right 08/21/2018  . Chronic pain of both knees 07/10/2017  . Unilateral primary osteoarthritis, left knee 07/10/2017  . Unilateral primary osteoarthritis, right knee 07/10/2017   Past Medical History:  Diagnosis Date  . Arthritis   . Asthma   . High cholesterol     Family History  Problem Relation Age of Onset  . Breast cancer Neg Hx     Past Surgical History:  Procedure Laterality Date  . ABDOMINAL HYSTERECTOMY    . BACK SURGERY    . TOTAL KNEE ARTHROPLASTY Right 08/21/2018   Procedure: RIGHT TOTAL KNEE ARTHROPLASTY;  Surgeon: Mcarthur Rossetti, MD;  Location: [facility];  Service: Orthopedics;  Laterality: Right;   Social History   Occupational History  . Not on file  Tobacco Use  . Smoking status: Never Smoker  . Smokeless tobacco: Never Used  Substance and Sexual Activity  . Alcohol use: Never    Frequency: Never  . Drug use: Never  . Sexual activity: Not on file

## 2018-09-04 NOTE — Progress Notes (Addendum)
Right lower extremity venous duplex completed - Preliminary results in Chart review CV Proc. Attempted to call the office could only get a voice mail.  [place] Cristyn Crossno,RVS 09/04/2018, 2:28 PM

## 2018-09-18 ENCOUNTER — Telehealth (INDEPENDENT_AMBULATORY_CARE_PROVIDER_SITE_OTHER): Payer: Self-pay

## 2018-09-18 NOTE — Telephone Encounter (Signed)
Really nothing that I am aware of.  She may wants to try shakes or boost.

## 2018-09-18 NOTE — Telephone Encounter (Signed)
She can drive when it has been 4 weeks from her knee surgery and she feels comfortable driving.

## 2018-09-18 NOTE — Telephone Encounter (Signed)
Patient called stating that she has not had an appetite for 2 weeks.  Stated that she is not taking any pain medications and thought that maybe her appetite would pick back up, but it didn't.  Would like to know what she needs to do? Patient had right TKA on 08/21/2018.  Cb# is [id number].  Please advise.  Thank you.

## 2018-09-18 NOTE — Telephone Encounter (Signed)
Can she drive?

## 2018-09-18 NOTE — Telephone Encounter (Signed)
Please advise 

## 2018-09-18 NOTE — Telephone Encounter (Signed)
Patient aware of the below message  

## 2018-09-24 ENCOUNTER — Encounter: Payer: Self-pay | Admitting: Physical Therapy

## 2018-09-24 ENCOUNTER — Ambulatory Visit: Payer: Medicare Other | Attending: Orthopaedic Surgery | Admitting: Physical Therapy

## 2018-09-24 ENCOUNTER — Other Ambulatory Visit: Payer: Self-pay

## 2018-09-24 DIAGNOSIS — M6281 Muscle weakness (generalized): Secondary | ICD-10-CM | POA: Insufficient documentation

## 2018-09-24 DIAGNOSIS — M25561 Pain in right knee: Secondary | ICD-10-CM | POA: Diagnosis not present

## 2018-09-24 DIAGNOSIS — R29898 Other symptoms and signs involving the musculoskeletal system: Secondary | ICD-10-CM | POA: Insufficient documentation

## 2018-09-24 DIAGNOSIS — R262 Difficulty in walking, not elsewhere classified: Secondary | ICD-10-CM | POA: Diagnosis not present

## 2018-09-24 DIAGNOSIS — M25661 Stiffness of right knee, not elsewhere classified: Secondary | ICD-10-CM | POA: Insufficient documentation

## 2018-09-24 NOTE — Therapy (Signed)
[facility] [address]  [place], [place], [address] Phone: [phone]   Fax:  [phone]  Physical Therapy Evaluation  Patient Details  Name: Kristina Burns MRN: [id number] Date of Birth: 04/24/1946 Referring Provider (PT): Jean Rosenthal, MD   Encounter Date: 09/24/2018  PT End of Session - 09/24/18 1148    Visit Number  1    Number of Visits  17    Date for PT Re-Evaluation  11/19/18    Authorization Type  Medicare BCBS    PT Start Time  1017    PT Stop Time  1102    PT Time Calculation (min)  45 min    Activity Tolerance  Patient tolerated treatment well;Patient limited by pain    Behavior During Therapy  [facility] for tasks assessed/performed       Past Medical History:  Diagnosis Date  . Arthritis   . Asthma   . High cholesterol     Past Surgical History:  Procedure Laterality Date  . ABDOMINAL HYSTERECTOMY    . BACK SURGERY    . TOTAL KNEE ARTHROPLASTY Right 08/21/2018   Procedure: RIGHT TOTAL KNEE ARTHROPLASTY;  Surgeon: Mcarthur Rossetti, MD;  Location: [facility];  Service: Orthopedics;  Laterality: Right;    There were no vitals filed for this visit.   Subjective Assessment - 09/24/18 1020    Subjective  Patient reports R TKA on 08/21/18. Was walking with rolling walker right after [place], transitioned to [facility] 2 weeks ago. Reports she does not have much pain at this time, only stiffness and swelling. Did have HHPT for 5 weeks.  Reports she had R foot drop after surgery and was walking with AFO, but it resolved before she was discharged. Patient's goal is to be able to bend her knee better, get off elevated commode.    Pertinent History  L3-4 and L4-5 fusion, asthma, HLD    Limitations  Sitting;Lifting;Standing;Walking;House hold activities    How long can you sit comfortably?  1 hour d/t discomfort    How long can you stand comfortably?  10 min     How long can you walk comfortably?  5 min     Patient Stated Goals  work on bending knee and get off elevated commode    Currently in Pain?  Yes    Pain Score  6     Pain Location  Knee    Pain Orientation  Anterior;Right    Pain Descriptors / Indicators  Tightness    Pain Type  Acute pain;Surgical pain         [facility] PT Assessment - 09/24/18 1033      Assessment   Medical Diagnosis  s/p R TKA    Referring Provider (PT)  Jean Rosenthal, MD    Onset Date/Surgical Date  08/21/18    Next MD Visit  10/03/18      Precautions   Precautions  --   hx lumbar fusion     Balance Screen   Has the patient fallen in the past 6 months  No    Has the patient had a decrease in activity level because of a fear of falling?   No    Is the patient reluctant to leave their home because of a fear of falling?   No      Home Environment   Living Environment  Private residence    Type of [place] Access  Stairs to enter    [employer] of Steps  2    Entrance Stairs-Rails  Right;Left    Home Layout  One level    [place] - single point;Walker - 2 wheels;Toilet riser;Shower seat      Prior Function   Level of Independence  Independent    Vocation  Retired    Database administrator   Overall Cognitive Status  Within Functional Limits for tasks assessed      Observation/Other Assessments   Observations  R medial calf with large fluid collection and mild warmth- nonpainful with pressure    Focus on Therapeutic Outcomes (FOTO)   Knee: 41 (59% limited, 41% predicted)      Sensation   Light Touch  Appears Intact      Coordination   Gross Motor Movements are Fluid and Coordinated  Yes      Posture/Postural Control   Posture/Postural Control  Postural limitations    Postural Limitations  Rounded Shoulders;Forward head;Weight shift left      ROM / Strength   AROM / PROM / Strength  AROM;PROM;Strength      AROM   AROM Assessment Site  Knee    Right/Left Knee  Right;Left    Right  Knee Extension  9    Right Knee Flexion  65    Left Knee Extension  2    Left Knee Flexion  124      PROM   PROM Assessment Site  Knee    Right/Left Knee  Right;Left    Right Knee Extension  5    Right Knee Flexion  80    Left Knee Extension  1    Left Knee Flexion  129      Strength   Strength Assessment Site  Hip;Knee;Ankle    Right/Left Hip  Right;Left    Right Hip Flexion  4/5    Right Hip ABduction  4-/5    Right Hip ADduction  4-/5    Left Hip Flexion  4/5    Left Hip ABduction  4-/5    Left Hip ADduction  4-/5    Right/Left Knee  Right;Left    Right Knee Flexion  3/5    Right Knee Extension  3/5    Left Knee Flexion  4/5    Left Knee Extension  4+/5    Right/Left Ankle  Right;Left    Right Ankle Dorsiflexion  3+/5    Right Ankle Plantar Flexion  3/5    Left Ankle Dorsiflexion  4+/5    Left Ankle Plantar Flexion  4+/5      Flexibility   Soft Tissue Assessment /Muscle Length  yes    Hamstrings  R mod tight    Quadriceps  R mildly tight      Palpation   Patella mobility  R patellar hypomobility in superior/inferior directions    Palpation comment  no TTP      Ambulation/Gait   Assistive device  Straight cane    Gait Pattern  Step-to pattern;Step-through pattern;Decreased step length - left;Decreased stance time - right;Decreased dorsiflexion - right;Decreased hip/knee flexion - right;Decreased weight shift to right;Trunk flexed;Poor foot clearance - right    Ambulation Surface  Level;Indoor    Gait velocity  considerably decreased                Objective measurements completed on examination: See above findings.  PT Education - 09/24/18 1147    Education Details  prognosis, POC, HEP; advised to use RW with community distances; advised her to ask MD about clearance to drive     Person(s) Educated  Patient    Methods  Explanation;Demonstration;Tactile cues;Verbal cues;Handout    Comprehension  Verbalized understanding;Returned  demonstration       PT Short Term Goals - 09/24/18 1302      PT SHORT TERM GOAL #1   Title  Patient to be independent with initial HEP.    Time  4    Period  Weeks    Status  New    Target Date  10/22/18        PT Long Term Goals - 09/24/18 1302      PT LONG TERM GOAL #1   Title  Patient to be independent with advanced HEP.    Time  8    Period  Weeks    Status  New    Target Date  11/19/18      PT LONG TERM GOAL #2   Title  Patient to demonstrate R knee AROM/PROM 0-120 degrees.     Time  8    Period  Weeks    Status  New    Target Date  11/19/18      PT LONG TERM GOAL #3   Title  Patient to demonstrate B LE strength >=4+/5.    Time  8    Period  Weeks    Status  New    Target Date  11/19/18      PT LONG TERM GOAL #4   Title  Patient to demonstrate 75% improvement in symmetrical step length, weight shift, knee flexion, and toe clearance with ambulation with LRAD.    Time  8    Period  Weeks    Status  New    Target Date  11/19/18      PT LONG TERM GOAL #5   Title  Patient to report tolerance of sitting on low commode without increase in R knee pain.    Time  8    Period  Weeks    Status  New    Target Date  11/19/18             Plan - 09/24/18 1148    Clinical Impression Statement  Patient is a 73y/o F presenting to OPPT with c/o R knee pain and stiffness s/p R TKA on 08/21/18. Had HHPT for 5 weeks, when she transitioned from [facility] to [facility]. Mentions that MD noticed foot drop on R foot after surgery and was walking with AFO before D/C. Patient today with limited and painful R knee ROM, decreased R LE strength- marked weakness in R ankle, decreased LE flexibility, patellar hypomobility in superior and inferior directions, edema to medial R calf without tenderness, and gait deviations. Educated on gentle stretching and strengthening HEP- patient reported understanding. Would benefit form skilled PT services 2x/week for 8 weeks to address aforementioned  impairments.     Clinical Presentation  Stable    Clinical Decision Making  Low    Rehab Potential  Good    Clinical Impairments Affecting Rehab Potential  L3-4 and L4-5 fusion, asthma, HLD    PT Frequency  2x / week    PT Duration  8 weeks    PT Treatment/Interventions  ADLs/Self Care Home Management;Cryotherapy;Electrical Stimulation;Functional mobility training;Stair training;Gait training;DME Instruction;Ultrasound;Moist Heat;Therapeutic activities;Therapeutic exercise;Balance training;Neuromuscular re-education;Patient/family education;Passive range of motion;Scar mobilization;Manual techniques;Dry needling;Energy  conservation;Splinting;Taping;Vasopneumatic Device    PT Next Visit Plan  reassess HEP       Patient will benefit from skilled therapeutic intervention in order to improve the following deficits and impairments:  Hypomobility, Increased edema, Decreased scar mobility, Decreased activity tolerance, Decreased strength, Pain, Increased fascial restricitons, Difficulty walking, Decreased balance, Decreased range of motion, Improper body mechanics, Postural dysfunction, Impaired flexibility  Visit Diagnosis: Acute pain of right knee  Stiffness of right knee, not elsewhere classified  Muscle weakness (generalized)  Difficulty in walking, not elsewhere classified  Other symptoms and signs involving the musculoskeletal system     Problem List Patient Active Problem List   Diagnosis Date Noted  . Status post total knee replacement, right 08/21/2018  . Chronic pain of both knees 07/10/2017  . Unilateral primary osteoarthritis, left knee 07/10/2017  . Unilateral primary osteoarthritis, right knee 07/10/2017    Janene Harvey, PT, DPT 09/24/18 1:08 PM   [facility] [address]  [place], [place], [address] Phone: [phone]   Fax:  [phone]  Name: Kristina Burns MRN: [id number] Date of Birth:  1946-07-25

## 2018-09-28 ENCOUNTER — Ambulatory Visit: Payer: Medicare Other

## 2018-10-02 ENCOUNTER — Ambulatory Visit: Payer: Medicare Other

## 2018-10-02 DIAGNOSIS — M6281 Muscle weakness (generalized): Secondary | ICD-10-CM

## 2018-10-02 DIAGNOSIS — R29898 Other symptoms and signs involving the musculoskeletal system: Secondary | ICD-10-CM | POA: Diagnosis not present

## 2018-10-02 DIAGNOSIS — R262 Difficulty in walking, not elsewhere classified: Secondary | ICD-10-CM | POA: Diagnosis not present

## 2018-10-02 DIAGNOSIS — M25561 Pain in right knee: Secondary | ICD-10-CM | POA: Diagnosis not present

## 2018-10-02 DIAGNOSIS — M25661 Stiffness of right knee, not elsewhere classified: Secondary | ICD-10-CM | POA: Diagnosis not present

## 2018-10-02 NOTE — Therapy (Signed)
[facility] [address]  [place], [place], [address] Phone: [phone]   Fax:  [phone]  Physical Therapy Treatment  Patient Details  Name: Kristina Burns MRN: [id number] Date of Birth: 09/24/1945 Referring Provider (PT): Jean Rosenthal, MD   Encounter Date: 10/02/2018  PT End of Session - 10/02/18 1025    Visit Number  2    Number of Visits  17    Date for PT Re-Evaluation  11/19/18    Authorization Type  Medicare BCBS    PT Start Time  1017    PT Stop Time  1110    PT Time Calculation (min)  53 min    Activity Tolerance  Patient tolerated treatment well;Patient limited by pain    Behavior During Therapy  [facility] for tasks assessed/performed       Past Medical History:  Diagnosis Date  . Arthritis   . Asthma   . High cholesterol     Past Surgical History:  Procedure Laterality Date  . ABDOMINAL HYSTERECTOMY    . BACK SURGERY    . TOTAL KNEE ARTHROPLASTY Right 08/21/2018   Procedure: RIGHT TOTAL KNEE ARTHROPLASTY;  Surgeon: Mcarthur Rossetti, MD;  Location: [facility];  Service: Orthopedics;  Laterality: Right;    There were no vitals filed for this visit.  Subjective Assessment - 10/02/18 1024    Subjective  Pt. reporting primarily stiffness now in R knee.      Pertinent History  L3-4 and L4-5 fusion, asthma, HLD    Patient Stated Goals  work on bending knee and get off elevated commode    Currently in Pain?  Yes    Pain Score  7     Pain Location  Knee    Pain Orientation  Anterior;Right    Pain Descriptors / Indicators  Tightness    Pain Type  Acute pain;Surgical pain    Multiple Pain Sites  No                       [facility] Adult PT Treatment/Exercise - 10/02/18 1040      Knee/Hip Exercises: Stretches   Passive Hamstring Stretch  Right;1 rep;30 seconds    Passive Hamstring Stretch Limitations  strap       Knee/Hip Exercises: Aerobic   Nustep  Lvl 1, 76min (UE/LE)      Knee/Hip Exercises: Standing   Heel Raises  Both;15 reps;3 seconds    Heel Raises Limitations  chair     Functional Squat  10 reps;3 seconds    Functional Squat Limitations  chair - heavy cueing for proper technique       Knee/Hip Exercises: Seated   Long Arc Quad  Right;10 reps    Long Arc Quad Limitations  Cues for TKE and to prevent    ~ 5 dg quad lag   Sit to [employer]  10 reps;with UE support   from mat table with cueing for even wt. shift      Knee/Hip Exercises: Supine   Quad Sets  Right;10 reps    Quad Sets Limitations  3" hold     Heel Slides  Right;10 reps    Heel Slides Limitations  with strap heels on peanut p-ball    Straight Leg Raises  Right;10 reps    Straight Leg Raises Limitations  Cues required for quad set prior to each rep      Modalities   Modalities  Vasopneumatic  Vasopneumatic   Number Minutes Vasopneumatic   10 minutes    Vasopnuematic Location   Knee    Vasopneumatic Pressure  Low    Vasopneumatic Temperature   coldest temp.      Manual Therapy   Manual Therapy  Joint mobilization    Manual therapy comments  seated and supine     Joint Mobilization  R patellar mobs all directions (limited) with pt. instruction on proper technque                PT Short Term Goals - 10/02/18 1030      PT SHORT TERM GOAL #1   Title  Patient to be independent with initial HEP.    Time  4    Period  Weeks    Status  On-going    Target Date  10/22/18        PT Long Term Goals - 10/02/18 1031      PT LONG TERM GOAL #1   Title  Patient to be independent with advanced HEP.    Time  8    Period  Weeks    Status  On-going      PT LONG TERM GOAL #2   Title  Patient to demonstrate R knee AROM/PROM 0-120 degrees.     Time  8    Period  Weeks    Status  On-going      PT LONG TERM GOAL #3   Title  Patient to demonstrate B LE strength >=4+/5.    Time  8    Period  Weeks    Status  On-going      PT LONG TERM GOAL #4   Title  Patient to  demonstrate 75% improvement in symmetrical step length, weight shift, knee flexion, and toe clearance with ambulation with LRAD.    Time  8    Period  Weeks    Status  On-going      PT LONG TERM GOAL #5   Title  Patient to report tolerance of sitting on low commode without increase in R knee pain.    Time  8    Period  Weeks    Status  On-going            Plan - 10/02/18 1032    Clinical Impression Statement  Pt. reporting she has been performing all HEP with exception of R heel side activity due to pain.  Pt. encouraged to perform heel slide and push through some discomfort as to continue focusing on improving R knee flexion ROM.  Pt. tolerated all other strengthening and R knee ROM activities well requiring occasional cueing for proper pacing and technique.  Ended visit with ice/compression to R knee to reduce post-exercise soreness and swelling.      Rehab Potential  Good    Clinical Impairments Affecting Rehab Potential  L3-4 and L4-5 fusion, asthma, HLD    PT Treatment/Interventions  ADLs/Self Care Home Management;Cryotherapy;Electrical Stimulation;Functional mobility training;Stair training;Gait training;DME Instruction;Ultrasound;Moist Heat;Therapeutic activities;Therapeutic exercise;Balance training;Neuromuscular re-education;Patient/family education;Passive range of motion;Scar mobilization;Manual techniques;Dry needling;Energy conservation;Splinting;Taping;Vasopneumatic Device    PT Next Visit Plan  Continue to monitor technique with HEP activities and adherence to heel slide HEP; progress ROM and strengthening activities    Consulted and Agree with Plan of Care  Patient       Patient will benefit from skilled therapeutic intervention in order to improve the following deficits and impairments:  Hypomobility, Increased edema, Decreased scar mobility, Decreased activity tolerance, Decreased strength,  Pain, Increased fascial restricitons, Difficulty walking, Decreased balance,  Decreased range of motion, Improper body mechanics, Postural dysfunction, Impaired flexibility  Visit Diagnosis: Acute pain of right knee  Stiffness of right knee, not elsewhere classified  Muscle weakness (generalized)  Difficulty in walking, not elsewhere classified  Other symptoms and signs involving the musculoskeletal system     Problem List Patient Active Problem List   Diagnosis Date Noted  . Status post total knee replacement, right 08/21/2018  . Chronic pain of both knees 07/10/2017  . Unilateral primary osteoarthritis, left knee 07/10/2017  . Unilateral primary osteoarthritis, right knee 07/10/2017    Bess Harvest, PTA 10/02/18 12:12 PM    [facility] [address]  [place], [place], [address] Phone: [phone]   Fax:  [phone]  Name: Shaddai Shapley MRN: [id number] Date of Birth: [birth year]

## 2018-10-03 ENCOUNTER — Ambulatory Visit (INDEPENDENT_AMBULATORY_CARE_PROVIDER_SITE_OTHER): Payer: Medicare Other | Admitting: Orthopaedic Surgery

## 2018-10-03 ENCOUNTER — Encounter (INDEPENDENT_AMBULATORY_CARE_PROVIDER_SITE_OTHER): Payer: Self-pay | Admitting: Orthopaedic Surgery

## 2018-10-03 DIAGNOSIS — Z96651 Presence of right artificial knee joint: Secondary | ICD-10-CM

## 2018-10-03 NOTE — Progress Notes (Signed)
HPI: Kristina Burns returns today 43 days status post right total knee arthroplasty.  She states she is overall trending towards improvement she is ambulating with a cane.  She is going to physical therapy.  Is taking no pain medications.  No fevers chills shortness of breath chest pain.  Doppler right lower leg was negative for DVT.  Physical exam: Right knee she has full extension flexion to approximately 90 degrees.  Calf supple nontender.  No instability valgus varus stressing.  Surgical incisions healing well proximal end of the incision is forming slight keloid.  Impression: Status post right total knee arthroplasty 08/21/2018  Plan: Discussed scar tissue mobilization mother suggested she pick up [place] and begin using this over the proximal end of the incision.  She will continue to work with physical therapy work on range of motion like serial 105 to 110 degrees at next visit.  See her back in 1 month.  Questions encouraged and answered

## 2018-10-04 ENCOUNTER — Ambulatory Visit: Payer: Medicare Other

## 2018-10-04 DIAGNOSIS — M25661 Stiffness of right knee, not elsewhere classified: Secondary | ICD-10-CM

## 2018-10-04 DIAGNOSIS — M25561 Pain in right knee: Secondary | ICD-10-CM

## 2018-10-04 DIAGNOSIS — M6281 Muscle weakness (generalized): Secondary | ICD-10-CM

## 2018-10-04 DIAGNOSIS — R29898 Other symptoms and signs involving the musculoskeletal system: Secondary | ICD-10-CM | POA: Diagnosis not present

## 2018-10-04 DIAGNOSIS — R262 Difficulty in walking, not elsewhere classified: Secondary | ICD-10-CM

## 2018-10-04 NOTE — Therapy (Signed)
[facility] [address]  [place], [place], [address] Phone: [phone]   Fax:  [phone]  Physical Therapy Treatment  Patient Details  Name: Kristina Burns MRN: [id number] Date of Birth: April 20, 1946 Referring Provider (PT): Jean Rosenthal, MD   Encounter Date: 10/04/2018  PT End of Session - 10/04/18 0951    Visit Number  3    Number of Visits  17    Date for PT Re-Evaluation  11/19/18    Authorization Type  Medicare BCBS    PT Start Time  0932    PT Stop Time  1023    PT Time Calculation (min)  51 min    Activity Tolerance  Patient tolerated treatment well;Patient limited by pain    Behavior During Therapy  [facility] for tasks assessed/performed       Past Medical History:  Diagnosis Date  . Arthritis   . Asthma   . High cholesterol     Past Surgical History:  Procedure Laterality Date  . ABDOMINAL HYSTERECTOMY    . BACK SURGERY    . TOTAL KNEE ARTHROPLASTY Right 08/21/2018   Procedure: RIGHT TOTAL KNEE ARTHROPLASTY;  Surgeon: Mcarthur Rossetti, MD;  Location: [facility];  Service: Orthopedics;  Laterality: Right;    There were no vitals filed for this visit.  Subjective Assessment - 10/04/18 0949    Subjective  Pt. denies soreness after last session.     Pertinent History  L3-4 and L4-5 fusion, asthma, HLD    Patient Stated Goals  work on bending knee and get off elevated commode    Currently in Pain?  No/denies    Pain Score  0-No pain    Multiple Pain Sites  No                       [facility] Adult PT Treatment/Exercise - 10/04/18 7564      Ambulation/Gait   Ambulation/Gait  Yes    Ambulation/Gait Assistance  6: Modified independent (Device/Increase time)    Ambulation Distance (Feet)  50 Feet    Assistive device  Rolling walker    Gait Pattern  Step-through pattern;Decreased step length - left;Decreased stance time - right;Decreased dorsiflexion - right;Decreased hip/knee  flexion - right;Decreased weight shift to right;Trunk flexed;Poor foot clearance - right    Ambulation Surface  Level;Indoor    Gait Comments  Cues required for RW spacing from BOS, upright posture and even wt. shift       Knee/Hip Exercises: Stretches   Passive Hamstring Stretch  Right;1 rep;30 seconds    Passive Hamstring Stretch Limitations  strap       Knee/Hip Exercises: Aerobic   Recumbent Bike  partial rotations for ROM, 6'      Knee/Hip Exercises: Standing   Heel Raises  Both;15 reps;3 seconds    Heel Raises Limitations  counter + front thigh muscles     Terminal Knee Extension  Right;15 reps;Theraband    Theraband Level (Terminal Knee Extension)  Level 4 (Blue)    Functional Squat  3 seconds   x 12 reps at counter   Functional Squat Limitations  to chair with airex pad at counter    Cues for upright posture and proper weight shift      Knee/Hip Exercises: Seated   Long Arc Quad  Right;15 reps    Long Arc Quad Weight  2 lbs.    Long Arc [employer]  seated in chair   Other Seated Knee/Hip Exercises  R fitter leg press (1 blue band, 1 black) x 10 reps   seated in chair to prevent trunk lean     Knee/Hip Exercises: Supine   Bridges  Both;10 reps    Bridges Limitations  Cueing required for LE placement and to maintain equel hip alignment as pt. tends to drop R hip    Straight Leg Raises  Right   x 12 rpes; cues required for proper height of movement   Straight Leg Raises Limitations  Cues required for quad set prior to each rep    Other Supine Knee/Hip Exercises  R knee flexion stretch with heels on peanut p-ball with strap assist 5" x 10 reps    PTA overpressure provided as pt. not flexing to end ROM     Vasopneumatic   Number Minutes Vasopneumatic   10 minutes    Vasopnuematic Location   Knee   R   Vasopneumatic Pressure  Low    Vasopneumatic Temperature   coldest temp.               PT Short Term Goals - 10/02/18 1030      PT SHORT  TERM GOAL #1   Title  Patient to be independent with initial HEP.    Time  4    Period  Weeks    Status  On-going    Target Date  10/22/18        PT Long Term Goals - 10/02/18 1031      PT LONG TERM GOAL #1   Title  Patient to be independent with advanced HEP.    Time  8    Period  Weeks    Status  On-going      PT LONG TERM GOAL #2   Title  Patient to demonstrate R knee AROM/PROM 0-120 degrees.     Time  8    Period  Weeks    Status  On-going      PT LONG TERM GOAL #3   Title  Patient to demonstrate B LE strength >=4+/5.    Time  8    Period  Weeks    Status  On-going      PT LONG TERM GOAL #4   Title  Patient to demonstrate 75% improvement in symmetrical step length, weight shift, knee flexion, and toe clearance with ambulation with LRAD.    Time  8    Period  Weeks    Status  On-going      PT LONG TERM GOAL #5   Title  Patient to report tolerance of sitting on low commode without increase in R knee pain.    Time  8    Period  Weeks    Status  On-going            Plan - 10/04/18 1119    Clinical Impression Statement  [name] denies soreness after last visit.  Required close monitoring and frequent cueing throughout session today to ensure proper technique, muscular effort, and proper pacing with therex activities.  Pt. tends to avoid end range flexion activities and frequently complains of high subjective pain levels with end range flexion which completely subsides with rest.  Pt. requiring heavy cueing from therapist today for overall effort in session.  Still performing SLR with visible ~ 5 dg quad lag despite cueing from therapist for quad set prior to each repetition.  Given quad lag, continue to encourage pt.  to ambulate around home and community with RW.  Ended visit with ice/compression to R knee to reduce post-exercise soreness and pain.      Rehab Potential  Good    Clinical Impairments Affecting Rehab Potential  L3-4 and L4-5 fusion, asthma, HLD    PT  Treatment/Interventions  ADLs/Self Care Home Management;Cryotherapy;Electrical Stimulation;Functional mobility training;Stair training;Gait training;DME Instruction;Ultrasound;Moist Heat;Therapeutic activities;Therapeutic exercise;Balance training;Neuromuscular re-education;Patient/family education;Passive range of motion;Scar mobilization;Manual techniques;Dry needling;Energy conservation;Splinting;Taping;Vasopneumatic Device    PT Next Visit Plan  Continue to monitor technique with HEP activities and adherence to heel slide HEP; progress ROM and strengthening activities    Consulted and Agree with Plan of Care  Patient       Patient will benefit from skilled therapeutic intervention in order to improve the following deficits and impairments:  Hypomobility, Increased edema, Decreased scar mobility, Decreased activity tolerance, Decreased strength, Pain, Increased fascial restricitons, Difficulty walking, Decreased balance, Decreased range of motion, Improper body mechanics, Postural dysfunction, Impaired flexibility  Visit Diagnosis: Acute pain of right knee  Stiffness of right knee, not elsewhere classified  Muscle weakness (generalized)  Difficulty in walking, not elsewhere classified  Other symptoms and signs involving the musculoskeletal system     Problem List Patient Active Problem List   Diagnosis Date Noted  . Status post total knee replacement, right 08/21/2018  . Chronic pain of both knees 07/10/2017  . Unilateral primary osteoarthritis, left knee 07/10/2017  . Unilateral primary osteoarthritis, right knee 07/10/2017    Bess Harvest, PTA 10/04/18 11:24 AM    [facility] [address]  [place], [place], [address] Phone: [phone]   Fax:  [phone]  Name: Myria Steenbergen MRN: [id number] Date of Birth: 03/04/1946

## 2018-10-08 ENCOUNTER — Ambulatory Visit: Payer: Medicare Other | Attending: Orthopaedic Surgery

## 2018-10-08 DIAGNOSIS — M25661 Stiffness of right knee, not elsewhere classified: Secondary | ICD-10-CM | POA: Insufficient documentation

## 2018-10-08 DIAGNOSIS — M6281 Muscle weakness (generalized): Secondary | ICD-10-CM | POA: Diagnosis not present

## 2018-10-08 DIAGNOSIS — R262 Difficulty in walking, not elsewhere classified: Secondary | ICD-10-CM | POA: Diagnosis not present

## 2018-10-08 DIAGNOSIS — R29898 Other symptoms and signs involving the musculoskeletal system: Secondary | ICD-10-CM | POA: Diagnosis not present

## 2018-10-08 DIAGNOSIS — M25561 Pain in right knee: Secondary | ICD-10-CM | POA: Diagnosis not present

## 2018-10-08 NOTE — Therapy (Signed)
[facility] [address]  [place], [place], [address] Phone: [phone]   Fax:  [phone]  Physical Therapy Treatment  Patient Details  Name: Kristina Burns MRN: [id number] Date of Birth: 1946-04-04 Referring Provider (PT): Jean Rosenthal, MD   Encounter Date: 10/08/2018  PT End of Session - 10/08/18 1055    Visit Number  4    Number of Visits  17    Date for PT Re-Evaluation  11/19/18    Authorization Type  Medicare BCBS    PT Start Time  1017    PT Stop Time  1115   ended with 10 min ice pack    PT Time Calculation (min)  58 min    Activity Tolerance  Patient tolerated treatment well;Patient limited by pain    Behavior During Therapy  [facility] for tasks assessed/performed       Past Medical History:  Diagnosis Date  . Arthritis   . Asthma   . High cholesterol     Past Surgical History:  Procedure Laterality Date  . ABDOMINAL HYSTERECTOMY    . BACK SURGERY    . TOTAL KNEE ARTHROPLASTY Right 08/21/2018   Procedure: RIGHT TOTAL KNEE ARTHROPLASTY;  Surgeon: Mcarthur Rossetti, MD;  Location: [facility];  Service: Orthopedics;  Laterality: Right;    There were no vitals filed for this visit.  Subjective Assessment - 10/08/18 1038    Subjective  Pt. reports she has been performing HEP daily over weekend.      Pertinent History  L3-4 and L4-5 fusion, asthma, HLD    Patient Stated Goals  work on bending knee and get off elevated commode    Currently in Pain?  Yes    Pain Score  3     Pain Location  Knee    Pain Descriptors / Indicators  Tightness    Pain Type  Acute pain;Surgical pain    Multiple Pain Sites  No                       [facility] Adult PT Treatment/Exercise - 10/08/18 1056      Ambulation/Gait   Ambulation/Gait  Yes    Ambulation/Gait Assistance  6: Modified independent (Device/Increase time)    Ambulation Distance (Feet)  150 Feet   two trials of 75 ft working on  sequencing/upright posture    Assistive device  Straight cane    Gait Pattern  Step-through pattern;Decreased step length - left;Decreased stance time - right;Decreased dorsiflexion - right;Decreased hip/knee flexion - right;Decreased weight shift to right;Trunk flexed;Poor foot clearance - right;Step-to pattern    Ambulation Surface  Level;Indoor    Gait Comments  Cues initially for proper sequencing with good carryover after trial #1 of 75 ft; 2nd trial of 82ft focusing on upright posture and step-through pattern as pt. tends to "stop" and perform step-to pattern      Knee/Hip Exercises: Stretches   Knee: Self-Stretch to increase Flexion  Right   5" x 10 reps seated flexion stretch with opposite LE assist   Knee: Self-Stretch Limitations  Cues for proper flexion stretch as pt. tends to avoid end range painful flexion stretch       Knee/Hip Exercises: Aerobic   Nustep  Lvl 3, 69min (UE/LE), seated moved up to lvl 7 for R knee flexion stretch       Knee/Hip Exercises: Standing   Terminal Knee Extension  Right;15 reps;Theraband    Theraband Level (  Terminal Knee Extension)  Level 4 (Blue)    Forward Step Up  Right;10 reps;Hand Hold: 1;Step Height: 6"    Forward Step Up Limitations  Cues for proper eccentric step-down     Functional Squat  3 seconds;15 reps      Knee/Hip Exercises: Seated   Other Seated Knee/Hip Exercises  R fitter leg press (1 blue band, 1 black) x 15 reps   machine positionined as to provided R knee flexion stretch    Hamstring Curl  Right;10 reps;Strengthening    Hamstring Limitations  seated green TB      Modalities   Modalities  Cryotherapy      Cryotherapy   Number Minutes Cryotherapy  10 Minutes    Cryotherapy Location  Knee   R knee   Type of Cryotherapy  Ice pack               PT Short Term Goals - 10/02/18 1030      PT SHORT TERM GOAL #1   Title  Patient to be independent with initial HEP.    Time  4    Period  Weeks    Status  On-going     Target Date  10/22/18        PT Long Term Goals - 10/02/18 1031      PT LONG TERM GOAL #1   Title  Patient to be independent with advanced HEP.    Time  8    Period  Weeks    Status  On-going      PT LONG TERM GOAL #2   Title  Patient to demonstrate R knee AROM/PROM 0-120 degrees.     Time  8    Period  Weeks    Status  On-going      PT LONG TERM GOAL #3   Title  Patient to demonstrate B LE strength >=4+/5.    Time  8    Period  Weeks    Status  On-going      PT LONG TERM GOAL #4   Title  Patient to demonstrate 75% improvement in symmetrical step length, weight shift, knee flexion, and toe clearance with ambulation with LRAD.    Time  8    Period  Weeks    Status  On-going      PT LONG TERM GOAL #5   Title  Patient to report tolerance of sitting on low commode without increase in R knee pain.    Time  8    Period  Weeks    Status  On-going            Plan - 10/08/18 1055    Clinical Impression Statement  Kristina Burns doing well today.  Session focused on therex to promote increased R knee AROM flexion and LE strength which was totaled well.  Gait training focused on improving pt. walking mechanics with L SPC with good carryover.  Ended visit with ice/compression to R knee to reduce post-exercise soreness and swelling.  Pt. with some increased tolerance for flexion type activities and verbalized improved R knee comfort following advancement of standing activities today.      Rehab Potential  Good    Clinical Impairments Affecting Rehab Potential  L3-4 and L4-5 fusion, asthma, HLD    PT Treatment/Interventions  ADLs/Self Care Home Management;Cryotherapy;Electrical Stimulation;Functional mobility training;Stair training;Gait training;DME Instruction;Ultrasound;Moist Heat;Therapeutic activities;Therapeutic exercise;Balance training;Neuromuscular re-education;Patient/family education;Passive range of motion;Scar mobilization;Manual techniques;Dry needling;Energy  conservation;Splinting;Taping;Vasopneumatic Device    PT Next Visit Plan  Continue to monitor technique with HEP activities and adherence to heel slide HEP; progress ROM and strengthening activities    Consulted and Agree with Plan of Care  Patient       Patient will benefit from skilled therapeutic intervention in order to improve the following deficits and impairments:  Hypomobility, Increased edema, Decreased scar mobility, Decreased activity tolerance, Decreased strength, Pain, Increased fascial restricitons, Difficulty walking, Decreased balance, Decreased range of motion, Improper body mechanics, Postural dysfunction, Impaired flexibility  Visit Diagnosis: Acute pain of right knee  Stiffness of right knee, not elsewhere classified  Muscle weakness (generalized)  Difficulty in walking, not elsewhere classified  Other symptoms and signs involving the musculoskeletal system     Problem List Patient Active Problem List   Diagnosis Date Noted  . Status post total knee replacement, right 08/21/2018  . Chronic pain of both knees 07/10/2017  . Unilateral primary osteoarthritis, left knee 07/10/2017  . Unilateral primary osteoarthritis, right knee 07/10/2017    Bess Harvest, PTA 10/08/18 6:21 PM   [facility] [address]  [place], [place], [address] Phone: [phone]   Fax:  [phone]  Name: Kristina Burns MRN: [id number] Date of Birth: Aug 30, 1945

## 2018-10-11 ENCOUNTER — Encounter: Payer: Self-pay | Admitting: Physical Therapy

## 2018-10-11 ENCOUNTER — Ambulatory Visit: Payer: Medicare Other | Admitting: Physical Therapy

## 2018-10-11 DIAGNOSIS — R29898 Other symptoms and signs involving the musculoskeletal system: Secondary | ICD-10-CM | POA: Diagnosis not present

## 2018-10-11 DIAGNOSIS — M25561 Pain in right knee: Secondary | ICD-10-CM

## 2018-10-11 DIAGNOSIS — M25661 Stiffness of right knee, not elsewhere classified: Secondary | ICD-10-CM | POA: Diagnosis not present

## 2018-10-11 DIAGNOSIS — R262 Difficulty in walking, not elsewhere classified: Secondary | ICD-10-CM

## 2018-10-11 DIAGNOSIS — M6281 Muscle weakness (generalized): Secondary | ICD-10-CM | POA: Diagnosis not present

## 2018-10-11 NOTE — Therapy (Signed)
[facility] [address]  [place], [place], [address] Phone: [phone]   Fax:  [phone]  Physical Therapy Treatment  Patient Details  Name: Kristina Burns MRN: [id number] Date of Birth: 1946-04-10 Referring Provider (PT): Jean Rosenthal, MD   Encounter Date: 10/11/2018  PT End of Session - 10/11/18 1058    Visit Number  5    Number of Visits  17    Date for PT Re-Evaluation  11/19/18    Authorization Type  Medicare BCBS    PT Start Time  1016    PT Stop Time  1057    PT Time Calculation (min)  41 min    Activity Tolerance  Patient tolerated treatment well;Patient limited by pain    Behavior During Therapy  [facility] for tasks assessed/performed       Past Medical History:  Diagnosis Date  . Arthritis   . Asthma   . High cholesterol     Past Surgical History:  Procedure Laterality Date  . ABDOMINAL HYSTERECTOMY    . BACK SURGERY    . TOTAL KNEE ARTHROPLASTY Right 08/21/2018   Procedure: RIGHT TOTAL KNEE ARTHROPLASTY;  Surgeon: Mcarthur Rossetti, MD;  Location: [facility];  Service: Orthopedics;  Laterality: Right;    There were no vitals filed for this visit.  Subjective Assessment - 10/11/18 1019    Subjective  Patient ambulating with SPC today. notes that she has been feeling fine since last session.     Pertinent History  L3-4 and L4-5 fusion, asthma, HLD    Patient Stated Goals  work on bending knee and get off elevated commode    Currently in Pain?  No/denies                       [facility] Adult PT Treatment/Exercise - 10/11/18 0001      Ambulation/Gait   Ambulation/Gait  Yes    Ambulation/Gait Assistance  4: Min guard    Ambulation Distance (Feet)  180 Feet    Assistive device  Straight cane    Gait Pattern  Step-through pattern;Decreased step length - left;Decreased stance time - right;Decreased dorsiflexion - right;Decreased hip/knee flexion - right;Decreased weight shift  to right;Trunk flexed;Poor foot clearance - right;Step-to pattern    Ambulation Surface  Level;Indoor    Gait Comments  gait training with SPC- heavy cues required for sequencing and to correct forward trunk lean      Knee/Hip Exercises: Stretches   Other Knee/Hip Stretches  sitting R knee flexion stretch 5x5" to tolerance      Knee/Hip Exercises: Aerobic   Nustep  L2 x 6 min LEs only      Knee/Hip Exercises: Standing   Heel Raises  Both;1 set;20 reps    Heel Raises Limitations  heel/toe raise at counter   cues to avoid pushing hips back   Hip Abduction  Stengthening;Right;Left;1 set;10 reps;Knee straight    Abduction Limitations  at counter top   cues to avoid anterior trunk lean   Functional Squat  1 set;15 reps    Functional Squat Limitations  at counter top   cues to correct forward chest lean     Knee/Hip Exercises: Supine   Heel Slides  Right;10 reps    Heel Slides Limitations  10x3" with strap heels on orange pball     Bridges  Strengthening;Both;1 set;10 reps    Bridges Limitations  manual assistance to maintain R foot down  limited hip elevation on R   Straight Leg Raises  Strengthening;Right;1 set;10 reps    Straight Leg Raises Limitations  mild quad lag    Straight Leg Raise with External Rotation  Strengthening;Right;1 set;10 reps    Straight Leg Raise with External Rotation Limitations  increased difficulty      Manual Therapy   Manual Therapy  Joint mobilization;Other (comment)    Joint Mobilization  R patellar mobs all directions- most limited in superior direction    Other Manual Therapy  R knee scar massage demonstration and education             PT Education - 10/11/18 1059    Education Details  advised patient to return to performing SLR at home d/t noncompliance with this exxercise    Person(s) Educated  Patient    Methods  Explanation;Demonstration    Comprehension  Verbalized understanding;Returned demonstration       PT Short Term Goals -  10/02/18 1030      PT SHORT TERM GOAL #1   Title  Patient to be independent with initial HEP.    Time  4    Period  Weeks    Status  On-going    Target Date  10/22/18        PT Long Term Goals - 10/02/18 1031      PT LONG TERM GOAL #1   Title  Patient to be independent with advanced HEP.    Time  8    Period  Weeks    Status  On-going      PT LONG TERM GOAL #2   Title  Patient to demonstrate R knee AROM/PROM 0-120 degrees.     Time  8    Period  Weeks    Status  On-going      PT LONG TERM GOAL #3   Title  Patient to demonstrate B LE strength >=4+/5.    Time  8    Period  Weeks    Status  On-going      PT LONG TERM GOAL #4   Title  Patient to demonstrate 75% improvement in symmetrical step length, weight shift, knee flexion, and toe clearance with ambulation with LRAD.    Time  8    Period  Weeks    Status  On-going      PT LONG TERM GOAL #5   Title  Patient to report tolerance of sitting on low commode without increase in R knee pain.    Time  8    Period  Weeks    Status  On-going            Plan - 10/11/18 1058    Clinical Impression Statement  Patient arrived to session, ambulating with SPC, with no new complaints. Worked on Personnel officer with [place]- heavy cues required for sequencing and to correct forward trunk lean. Patient educated on scar massage to improve tissue pliability. Patient reported understanding. Demonstrated good R patellar mobility- most limited in superior direction. Demonstrating quad lag with SLR and increased difficulty elevating LE with introduction of SLR with ER. Worked on standing LE strengthening with good form with squats- minor correction required for anterior trunk lean. Ended session with no complaints. Patient progressing well.     Clinical Impairments Affecting Rehab Potential  L3-4 and L4-5 fusion, asthma, HLD    PT Treatment/Interventions  ADLs/Self Care Home Management;Cryotherapy;Electrical Stimulation;Functional mobility  training;Stair training;Gait training;DME Instruction;Ultrasound;Moist Heat;Therapeutic activities;Therapeutic exercise;Balance training;Neuromuscular re-education;Patient/family education;Passive range of  motion;Scar mobilization;Manual techniques;Dry needling;Energy conservation;Splinting;Taping;Vasopneumatic Device    PT Next Visit Plan  Continue to monitor technique with HEP activities and adherence to heel slide HEP; gait training with SPC    Consulted and Agree with Plan of Care  Patient       Patient will benefit from skilled therapeutic intervention in order to improve the following deficits and impairments:  Hypomobility, Increased edema, Decreased scar mobility, Decreased activity tolerance, Decreased strength, Pain, Increased fascial restricitons, Difficulty walking, Decreased balance, Decreased range of motion, Improper body mechanics, Postural dysfunction, Impaired flexibility  Visit Diagnosis: Acute pain of right knee  Stiffness of right knee, not elsewhere classified  Muscle weakness (generalized)  Difficulty in walking, not elsewhere classified  Other symptoms and signs involving the musculoskeletal system     Problem List Patient Active Problem List   Diagnosis Date Noted  . Status post total knee replacement, right 08/21/2018  . Chronic pain of both knees 07/10/2017  . Unilateral primary osteoarthritis, left knee 07/10/2017  . Unilateral primary osteoarthritis, right knee 07/10/2017    Janene Harvey, PT, DPT 10/11/18 11:00 AM   [facility] [address]  [place], [place], [address] Phone: [phone]   Fax:  [phone]  Name: Colletta Spillers MRN: [id number] Date of Birth: [birth year]

## 2018-10-15 ENCOUNTER — Ambulatory Visit: Payer: Medicare Other

## 2018-10-15 DIAGNOSIS — R262 Difficulty in walking, not elsewhere classified: Secondary | ICD-10-CM | POA: Diagnosis not present

## 2018-10-15 DIAGNOSIS — M25661 Stiffness of right knee, not elsewhere classified: Secondary | ICD-10-CM | POA: Diagnosis not present

## 2018-10-15 DIAGNOSIS — M6281 Muscle weakness (generalized): Secondary | ICD-10-CM | POA: Diagnosis not present

## 2018-10-15 DIAGNOSIS — R29898 Other symptoms and signs involving the musculoskeletal system: Secondary | ICD-10-CM | POA: Diagnosis not present

## 2018-10-15 DIAGNOSIS — M25561 Pain in right knee: Secondary | ICD-10-CM | POA: Diagnosis not present

## 2018-10-15 NOTE — Therapy (Signed)
[facility] [address]  [place], [place], [address] Phone: [phone]   Fax:  [phone]  Physical Therapy Treatment  Patient Details  Name: Kristina Burns MRN: [id number] Date of Birth: April 25, 1946 Referring Provider (PT): Jean Rosenthal, MD   Encounter Date: 10/15/2018  PT End of Session - 10/15/18 1023    Visit Number  6    Number of Visits  17    Date for PT Re-Evaluation  11/19/18    Authorization Type  Medicare BCBS    PT Start Time  1018    PT Stop Time  1110    PT Time Calculation (min)  52 min    Activity Tolerance  Patient tolerated treatment well;Patient limited by pain    Behavior During Therapy  [facility] for tasks assessed/performed       Past Medical History:  Diagnosis Date  . Arthritis   . Asthma   . High cholesterol     Past Surgical History:  Procedure Laterality Date  . ABDOMINAL HYSTERECTOMY    . BACK SURGERY    . TOTAL KNEE ARTHROPLASTY Right 08/21/2018   Procedure: RIGHT TOTAL KNEE ARTHROPLASTY;  Surgeon: Mcarthur Rossetti, MD;  Location: [facility];  Service: Orthopedics;  Laterality: Right;    There were no vitals filed for this visit.  Subjective Assessment - 10/15/18 1023    Subjective  Pt. reporting greater comfort with ambulating with SPC.      Pertinent History  L3-4 and L4-5 fusion, asthma, HLD    Patient Stated Goals  work on bending knee and get off elevated commode    Currently in Pain?  No/denies    Pain Score  0-No pain    Pain Location  Knee    Pain Orientation  Anterior;Right    Pain Descriptors / Indicators  Tightness    Pain Type  Acute pain    Multiple Pain Sites  No         [facility] PT Assessment - 10/15/18 0001      PROM   Right Knee Flexion  84                   [facility] Adult PT Treatment/Exercise - 10/15/18 1056      Ambulation/Gait   Ambulation/Gait  Yes    Ambulation/Gait Assistance  5: Supervision    Ambulation Distance (Feet)   180 Feet    Assistive device  Straight cane    Gait Pattern  Step-through pattern;Decreased step length - left;Decreased stance time - right;Decreased dorsiflexion - right;Decreased hip/knee flexion - right;Decreased weight shift to right;Trunk flexed;Poor foot clearance - right    Ambulation Surface  Level;Indoor    Gait Comments  Pt. able to perform gait with SPC with improved sequencing and consistent step-through pattern however required cueing for increased opposite UE arm swing, increased R hip/knee flexion, and upright posture      Knee/Hip Exercises: Stretches   Knee: Self-Stretch to increase Flexion  --   R knee flexion stretch 5" x 10 reps    Knee: Self-Stretch Limitations  Standing lunge into 6" step, Seated AAROM with other LE assistance      Knee/Hip Exercises: Aerobic   Recumbent Bike  partial rotations for ROM, 6'      Knee/Hip Exercises: Standing   Functional Squat  1 set;15 reps   Heavy cueing to squat to depth of R knee flexion stretch    Functional Squat Limitations  at counter  top    Other Standing Knee Exercises  Alternating toe-clears to 6" step working on avoiding circumduction and for increased speed of R hip/knee flexion for carryover with SPC x 10 rpes each way; light UE support at counter       Vasopneumatic   Number Minutes Vasopneumatic   10 minutes    Vasopnuematic Location   Knee    Vasopneumatic Pressure  Low    Vasopneumatic Temperature   coldest temp.      Manual Therapy   Manual Therapy  Joint mobilization    Joint Mobilization  R patellar mobs all directions- most limited in superior direction               PT Short Term Goals - 10/02/18 1030      PT SHORT TERM GOAL #1   Title  Patient to be independent with initial HEP.    Time  4    Period  Weeks    Status  On-going    Target Date  10/22/18        PT Long Term Goals - 10/02/18 1031      PT LONG TERM GOAL #1   Title  Patient to be independent with advanced HEP.    Time  8     Period  Weeks    Status  On-going      PT LONG TERM GOAL #2   Title  Patient to demonstrate R knee AROM/PROM 0-120 degrees.     Time  8    Period  Weeks    Status  On-going      PT LONG TERM GOAL #3   Title  Patient to demonstrate B LE strength >=4+/5.    Time  8    Period  Weeks    Status  On-going      PT LONG TERM GOAL #4   Title  Patient to demonstrate 75% improvement in symmetrical step length, weight shift, knee flexion, and toe clearance with ambulation with LRAD.    Time  8    Period  Weeks    Status  On-going      PT LONG TERM GOAL #5   Title  Patient to report tolerance of sitting on low commode without increase in R knee pain.    Time  8    Period  Weeks    Status  On-going            Plan - 10/15/18 1026    Clinical Impression Statement  Kristina Burns reporting she has been ambulating at home mostly without SPC.  Pt. encouraged to continue walking with [facility] everywhere due to poor LE clearance and to focus on practicing proper sequencing with SPC.  Pt. verbalized understanding.  Pt. able to demo improved gait mechanics with step-through pattern and sequencing with SPC in session today however required cueing for upright posture, and increased R hip/knee flexion.  Therex focusing on improving LE clearance with "Toe-clears" to 6" step, and functional squat to depth of R knee flexion stretch.  Pt. reporting increased R knee pain with flexion stretch and requiring encouragement from therapist that, "this is ok" for proper performance.  Pt. seemed to demonstrate improved understanding of need for consistent R knee flexion stretch with previously issued home program to end session today.  Will continue to monitor pt. adherence to flexion stretch HEP activities.  Ended visit with ice/compression to R knee for hopeful reduction in post-exercise pain and swelling.  Rehab Potential  Good    Clinical Impairments Affecting Rehab Potential  L3-4 and L4-5 fusion, asthma, HLD    PT  Treatment/Interventions  ADLs/Self Care Home Management;Cryotherapy;Electrical Stimulation;Functional mobility training;Stair training;Gait training;DME Instruction;Ultrasound;Moist Heat;Therapeutic activities;Therapeutic exercise;Balance training;Neuromuscular re-education;Patient/family education;Passive range of motion;Scar mobilization;Manual techniques;Dry needling;Energy conservation;Splinting;Taping;Vasopneumatic Device    PT Next Visit Plan  Continue to monitor technique with HEP activities and adherence to heel slide HEP; gait training with SPC    Consulted and Agree with Plan of Care  Patient       Patient will benefit from skilled therapeutic intervention in order to improve the following deficits and impairments:  Hypomobility, Increased edema, Decreased scar mobility, Decreased activity tolerance, Decreased strength, Pain, Increased fascial restricitons, Difficulty walking, Decreased balance, Decreased range of motion, Improper body mechanics, Postural dysfunction, Impaired flexibility  Visit Diagnosis: Acute pain of right knee  Stiffness of right knee, not elsewhere classified  Muscle weakness (generalized)  Difficulty in walking, not elsewhere classified  Other symptoms and signs involving the musculoskeletal system     Problem List Patient Active Problem List   Diagnosis Date Noted  . Status post total knee replacement, right 08/21/2018  . Chronic pain of both knees 07/10/2017  . Unilateral primary osteoarthritis, left knee 07/10/2017  . Unilateral primary osteoarthritis, right knee 07/10/2017    Bess Harvest, PTA 10/15/18 12:27 PM   [facility] [address]  [place], [place], [address] Phone: [phone]   Fax:  [phone]  Name: Kristina Burns MRN: [id number] Date of Birth: [birth year]

## 2018-10-18 ENCOUNTER — Other Ambulatory Visit: Payer: Self-pay

## 2018-10-18 ENCOUNTER — Encounter: Payer: Self-pay | Admitting: Physical Therapy

## 2018-10-18 ENCOUNTER — Ambulatory Visit: Payer: Medicare Other | Admitting: Physical Therapy

## 2018-10-18 DIAGNOSIS — M25561 Pain in right knee: Secondary | ICD-10-CM | POA: Diagnosis not present

## 2018-10-18 DIAGNOSIS — M25661 Stiffness of right knee, not elsewhere classified: Secondary | ICD-10-CM

## 2018-10-18 DIAGNOSIS — R29898 Other symptoms and signs involving the musculoskeletal system: Secondary | ICD-10-CM | POA: Diagnosis not present

## 2018-10-18 DIAGNOSIS — R262 Difficulty in walking, not elsewhere classified: Secondary | ICD-10-CM | POA: Diagnosis not present

## 2018-10-18 DIAGNOSIS — M6281 Muscle weakness (generalized): Secondary | ICD-10-CM

## 2018-10-18 NOTE — Therapy (Signed)
[facility] [address]  [place], [place], [address] Phone: [phone]   Fax:  [phone]  Physical Therapy Treatment  Patient Details  Name: Kristina Burns MRN: [id number] Date of Birth: September 02, 1945 Referring Provider (PT): Jean Rosenthal, MD   Encounter Date: 10/18/2018  PT End of Session - 10/18/18 1040    Visit Number  7    Number of Visits  17    Date for PT Re-Evaluation  11/19/18    Authorization Type  Medicare BCBS    PT Start Time  1001    PT Stop Time  1049    PT Time Calculation (min)  48 min    Activity Tolerance  Patient tolerated treatment well;Patient limited by pain    Behavior During Therapy  [facility] for tasks assessed/performed       Past Medical History:  Diagnosis Date  . Arthritis   . Asthma   . High cholesterol     Past Surgical History:  Procedure Laterality Date  . ABDOMINAL HYSTERECTOMY    . BACK SURGERY    . TOTAL KNEE ARTHROPLASTY Right 08/21/2018   Procedure: RIGHT TOTAL KNEE ARTHROPLASTY;  Surgeon: Mcarthur Rossetti, MD;  Location: [facility];  Service: Orthopedics;  Laterality: Right;    There were no vitals filed for this visit.  Subjective Assessment - 10/18/18 1016    Subjective  Patient reports that she is not having much pain in the R knee, only stiffness. Walking and bending is getting better.     Pertinent History  L3-4 and L4-5 fusion, asthma, HLD    Patient Stated Goals  work on bending knee and get off elevated commode    Currently in Pain?  Yes    Pain Score  2     Pain Orientation  Right;Anterior    Pain Descriptors / Indicators  Tightness    Pain Type  Acute pain                       [facility] Adult PT Treatment/Exercise - 10/18/18 0001      Knee/Hip Exercises: Stretches   Other Knee/Hip Stretches  R knee flexion stretch on step at counter top 10x5" to tolerance      Knee/Hip Exercises: Aerobic   Recumbent Bike  partial rotations for  ROM, 6'      Knee/Hip Exercises: Standing   Forward Step Up  Right;10 reps;Hand Hold: 1;Step Height: 6"    Forward Step Up Limitations  cues to avoid R LE circumduction and anterior trunk lean    Functional Squat  1 set;15 reps    Functional Squat Limitations  at counter top to chair with    cues to avoid anterior trunk lean     Knee/Hip Exercises: Supine   Quad Sets  Strengthening;Right;1 set;10 reps    Quad Sets Limitations  R knee quad set 10x10" with R ankle elevated on bolster    Straight Leg Raises  Strengthening;Right;1 set;10 reps    Straight Leg Raises Limitations  quad lag   cues for quad set before each rep   Straight Leg Raise with External Rotation  Strengthening;Right;1 set;10 reps      Vasopneumatic   Number Minutes Vasopneumatic   10 minutes    Vasopnuematic Location   Knee   R   Vasopneumatic Pressure  Low    Vasopneumatic Temperature   coldest temp.      Manual Therapy   Manual  Therapy  Joint mobilization;Passive ROM    Joint Mobilization  R patellar mobilizations in all planes grade III/IV to tolerance- good mobility in all directions; R knee flexion mobilizations posteriorly on tibia grade III to tolerance    to tolerance   Passive ROM  R knee flexion PROM after joint mobilizations to tolerance               PT Short Term Goals - 10/02/18 1030      PT SHORT TERM GOAL #1   Title  Patient to be independent with initial HEP.    Time  4    Period  Weeks    Status  On-going    Target Date  10/22/18        PT Long Term Goals - 10/02/18 1031      PT LONG TERM GOAL #1   Title  Patient to be independent with advanced HEP.    Time  8    Period  Weeks    Status  On-going      PT LONG TERM GOAL #2   Title  Patient to demonstrate R knee AROM/PROM 0-120 degrees.     Time  8    Period  Weeks    Status  On-going      PT LONG TERM GOAL #3   Title  Patient to demonstrate B LE strength >=4+/5.    Time  8    Period  Weeks    Status  On-going       PT LONG TERM GOAL #4   Title  Patient to demonstrate 75% improvement in symmetrical step length, weight shift, knee flexion, and toe clearance with ambulation with LRAD.    Time  8    Period  Weeks    Status  On-going      PT LONG TERM GOAL #5   Title  Patient to report tolerance of sitting on low commode without increase in R knee pain.    Time  8    Period  Weeks    Status  On-going            Plan - 10/18/18 1041    Clinical Impression Statement  Patient arrived to session with report of improved R knee flexion and gait with SPC. Patient tolerated R knee mobilizations to tolerance for increased patellar mobility and knee flexion. Followed manual therapy with knee flexion stretching to continue addressing ROM. Patient still demonstrating quad lag with SLR, but with good understanding and carryover of quad set exercise. Patient with tendency for anterior trunk lean with squatting and anterior step ups. Good correction of circumduction with step ups today. Ended session with [place] to R knee for post-exercise soreness. Patient without complaints at end of session.     Clinical Impairments Affecting Rehab Potential  L3-4 and L4-5 fusion, asthma, HLD    PT Treatment/Interventions  ADLs/Self Care Home Management;Cryotherapy;Electrical Stimulation;Functional mobility training;Stair training;Gait training;DME Instruction;Ultrasound;Moist Heat;Therapeutic activities;Therapeutic exercise;Balance training;Neuromuscular re-education;Patient/family education;Passive range of motion;Scar mobilization;Manual techniques;Dry needling;Energy conservation;Splinting;Taping;Vasopneumatic Device    PT Next Visit Plan  address R knee flexion ROM and quad strength    Consulted and Agree with Plan of Care  Patient       Patient will benefit from skilled therapeutic intervention in order to improve the following deficits and impairments:  Hypomobility, Increased edema, Decreased scar mobility, Decreased  activity tolerance, Decreased strength, Pain, Increased fascial restricitons, Difficulty walking, Decreased balance, Decreased range of motion, Improper body mechanics, Postural dysfunction, Impaired  flexibility  Visit Diagnosis: Acute pain of right knee  Stiffness of right knee, not elsewhere classified  Muscle weakness (generalized)  Difficulty in walking, not elsewhere classified  Other symptoms and signs involving the musculoskeletal system     Problem List Patient Active Problem List   Diagnosis Date Noted  . Status post total knee replacement, right 08/21/2018  . Chronic pain of both knees 07/10/2017  . Unilateral primary osteoarthritis, left knee 07/10/2017  . Unilateral primary osteoarthritis, right knee 07/10/2017     Janene Harvey, PT, DPT 10/18/18 10:52 AM   [facility] [address]  [place], [place], [address] Phone: [phone]   Fax:  [phone]  Name: Kristina Burns MRN: [id number] Date of Birth: 1945-10-30

## 2018-10-22 ENCOUNTER — Ambulatory Visit: Payer: Medicare Other | Admitting: Physical Therapy

## 2018-10-22 ENCOUNTER — Other Ambulatory Visit: Payer: Self-pay

## 2018-10-22 ENCOUNTER — Encounter: Payer: Self-pay | Admitting: Physical Therapy

## 2018-10-22 DIAGNOSIS — R262 Difficulty in walking, not elsewhere classified: Secondary | ICD-10-CM | POA: Diagnosis not present

## 2018-10-22 DIAGNOSIS — M6281 Muscle weakness (generalized): Secondary | ICD-10-CM

## 2018-10-22 DIAGNOSIS — R29898 Other symptoms and signs involving the musculoskeletal system: Secondary | ICD-10-CM | POA: Diagnosis not present

## 2018-10-22 DIAGNOSIS — M25661 Stiffness of right knee, not elsewhere classified: Secondary | ICD-10-CM | POA: Diagnosis not present

## 2018-10-22 DIAGNOSIS — M25561 Pain in right knee: Secondary | ICD-10-CM

## 2018-10-22 NOTE — Therapy (Signed)
[facility] [address]  [place], [place], [address] Phone: [phone]   Fax:  [phone]  Physical Therapy Treatment  Patient Details  Name: Kristina Burns MRN: [id number] Date of Birth: 02-05-1946 Referring Provider (PT): Jean Rosenthal, MD   Encounter Date: 10/22/2018  PT End of Session - 10/22/18 1059    Visit Number  8    Number of Visits  17    Date for PT Re-Evaluation  11/19/18    Authorization Type  Medicare BCBS    PT Start Time  1014    PT Stop Time  1057    PT Time Calculation (min)  43 min    Activity Tolerance  Patient tolerated treatment well;Patient limited by pain    Behavior During Therapy  [facility] for tasks assessed/performed       Past Medical History:  Diagnosis Date  . Arthritis   . Asthma   . High cholesterol     Past Surgical History:  Procedure Laterality Date  . ABDOMINAL HYSTERECTOMY    . BACK SURGERY    . TOTAL KNEE ARTHROPLASTY Right 08/21/2018   Procedure: RIGHT TOTAL KNEE ARTHROPLASTY;  Surgeon: Mcarthur Rossetti, MD;  Location: [facility];  Service: Orthopedics;  Laterality: Right;    There were no vitals filed for this visit.  Subjective Assessment - 10/22/18 1013    Subjective  Reports that she had some soreness through her R anterior thigh after last session, which quickly dissipated.     Pertinent History  L3-4 and L4-5 fusion, asthma, HLD    Patient Stated Goals  work on bending knee and get off elevated commode    Currently in Pain?  Yes    Pain Score  3     Pain Location  Knee    Pain Orientation  Right;Anterior    Pain Descriptors / Indicators  Sore    Pain Type  Acute pain;Surgical pain         [facility] PT Assessment - 10/22/18 0001      PROM   Right Knee Flexion  88                   [facility] Adult PT Treatment/Exercise - 10/22/18 0001      Knee/Hip Exercises: Stretches   Passive Hamstring Stretch  Right;30 seconds;2 reps    Passive  Hamstring Stretch Limitations  strap     Gastroc Stretch  Right;2 reps;30 seconds    Gastroc Stretch Limitations  2x long sitting with strap; 2x standing      Knee/Hip Exercises: Aerobic   Recumbent Bike  partial rotations for ROM, 6'      Knee/Hip Exercises: Standing   Heel Raises  Both;1 set;20 reps    Heel Raises Limitations  heel/toe raise at counter   cues to avoid bringing hips back   Wall Squat  2 sets;10 reps    Wall Squat Limitations  cues to maintain back against wall and shift R      Knee/Hip Exercises: Supine   Heel Slides  Right;10 reps    Heel Slides Limitations  10x5" with strap heels on peanut pball       Manual Therapy   Manual Therapy  Joint mobilization;Passive ROM;Soft tissue mobilization;Myofascial release    Joint Mobilization  R patellar mobilizations in all planes grade III/IV to tolerance- good mobility in all directions; R knee flexion mobilizations posteriorly on tibia grade III to tolerance  Soft tissue mobilization  R medial gastroc and medial HS- tenderness throughout with intermittent TPR    Myofascial Release  R medial HS and gastroc TPR    Passive ROM  R knee flexion holds in between joint mobilizations to tolerance             PT Education - 10/22/18 1059    Education Details  update to HEP    Person(s) Educated  Patient    Methods  Explanation;Demonstration;Tactile cues;Handout;Verbal cues    Comprehension  Verbalized understanding;Returned demonstration       PT Short Term Goals - 10/22/18 1102      PT SHORT TERM GOAL #1   Title  Patient to be independent with initial HEP.    Time  4    Period  Weeks    Status  Achieved    Target Date  10/22/18        PT Long Term Goals - 10/02/18 1031      PT LONG TERM GOAL #1   Title  Patient to be independent with advanced HEP.    Time  8    Period  Weeks    Status  On-going      PT LONG TERM GOAL #2   Title  Patient to demonstrate R knee AROM/PROM 0-120 degrees.     Time  8     Period  Weeks    Status  On-going      PT LONG TERM GOAL #3   Title  Patient to demonstrate B LE strength >=4+/5.    Time  8    Period  Weeks    Status  On-going      PT LONG TERM GOAL #4   Title  Patient to demonstrate 75% improvement in symmetrical step length, weight shift, knee flexion, and toe clearance with ambulation with LRAD.    Time  8    Period  Weeks    Status  On-going      PT LONG TERM GOAL #5   Title  Patient to report tolerance of sitting on low commode without increase in R knee pain.    Time  8    Period  Weeks    Status  On-going            Plan - 10/22/18 1102    Clinical Impression Statement  Patient arrived to session with report of R quad soreness after last session that quickly dissipated. Worked on manual therapy to patient's tolerance for improved R knee flexion. R knee flexion measured 88 degrees after manual therapy- patient limited by pain. Observable swelling to R medial gastroc evident- patient with tenderness and palpable trigger points in medial gastroc and HS. Patient reporting that she has had this swelling for a while now. No warmth or redness evident, but advised patient to present to ED if DVT symptoms occur. Updated HEP to include gastroc stretching- patient reported understanding. Introduced wall squats with patient requiring cues to shift weight to R and maintain back flat against wall. No complaints at end of session. Patient reporting she would ice R knee at home.     Clinical Impairments Affecting Rehab Potential  L3-4 and L4-5 fusion, asthma, HLD    PT Treatment/Interventions  ADLs/Self Care Home Management;Cryotherapy;Electrical Stimulation;Functional mobility training;Stair training;Gait training;DME Instruction;Ultrasound;Moist Heat;Therapeutic activities;Therapeutic exercise;Balance training;Neuromuscular re-education;Patient/family education;Passive range of motion;Scar mobilization;Manual techniques;Dry needling;Energy  conservation;Splinting;Taping;Vasopneumatic Device    PT Next Visit Plan  address R knee flexion ROM and quad strength  Consulted and Agree with Plan of Care  Patient       Patient will benefit from skilled therapeutic intervention in order to improve the following deficits and impairments:  Hypomobility, Increased edema, Decreased scar mobility, Decreased activity tolerance, Decreased strength, Pain, Increased fascial restricitons, Difficulty walking, Decreased balance, Decreased range of motion, Improper body mechanics, Postural dysfunction, Impaired flexibility  Visit Diagnosis: Acute pain of right knee  Stiffness of right knee, not elsewhere classified  Muscle weakness (generalized)  Difficulty in walking, not elsewhere classified  Other symptoms and signs involving the musculoskeletal system     Problem List Patient Active Problem List   Diagnosis Date Noted  . Status post total knee replacement, right 08/21/2018  . Chronic pain of both knees 07/10/2017  . Unilateral primary osteoarthritis, left knee 07/10/2017  . Unilateral primary osteoarthritis, right knee 07/10/2017    Janene Harvey, PT, DPT 10/22/18 11:04 AM   [facility] [address]  [place], [place], [address] Phone: [phone]   Fax:  [phone]  Name: Kristina Burns MRN: [id number] Date of Birth: Oct 17, 1945

## 2018-10-25 ENCOUNTER — Encounter: Payer: Self-pay | Admitting: Physical Therapy

## 2018-10-25 ENCOUNTER — Ambulatory Visit: Payer: Medicare Other | Admitting: Physical Therapy

## 2018-10-25 ENCOUNTER — Other Ambulatory Visit: Payer: Self-pay

## 2018-10-25 DIAGNOSIS — M25661 Stiffness of right knee, not elsewhere classified: Secondary | ICD-10-CM | POA: Diagnosis not present

## 2018-10-25 DIAGNOSIS — R29898 Other symptoms and signs involving the musculoskeletal system: Secondary | ICD-10-CM

## 2018-10-25 DIAGNOSIS — R262 Difficulty in walking, not elsewhere classified: Secondary | ICD-10-CM

## 2018-10-25 DIAGNOSIS — M25561 Pain in right knee: Secondary | ICD-10-CM

## 2018-10-25 DIAGNOSIS — M6281 Muscle weakness (generalized): Secondary | ICD-10-CM | POA: Diagnosis not present

## 2018-10-25 NOTE — Therapy (Signed)
[facility] [address]  [place], [place], [address] Phone: [phone]   Fax:  [phone]  Physical Therapy Progress Note  Patient Details  Name: Kristina Burns MRN: [id number] Date of Birth: 1946/05/20 Referring Provider (PT): Jean Rosenthal, MD   Progress Note Reporting Period 09/24/18 to 10/25/18  See note below for Objective Data and Assessment of Progress/Goals.     Encounter Date: 10/25/2018  PT End of Session - 10/25/18 1201    Visit Number  9    Number of Visits  17    Date for PT Re-Evaluation  11/19/18    Authorization Type  Medicare BCBS    PT Start Time  1017    PT Stop Time  1058    PT Time Calculation (min)  41 min    Activity Tolerance  Patient tolerated treatment well;Patient limited by pain    Behavior During Therapy  WFL for tasks assessed/performed       Past Medical History:  Diagnosis Date  . Arthritis   . Asthma   . High cholesterol     Past Surgical History:  Procedure Laterality Date  . ABDOMINAL HYSTERECTOMY    . BACK SURGERY    . TOTAL KNEE ARTHROPLASTY Right 08/21/2018   Procedure: RIGHT TOTAL KNEE ARTHROPLASTY;  Surgeon: Mcarthur Rossetti, MD;  Location: [facility];  Service: Orthopedics;  Laterality: Right;    There were no vitals filed for this visit.  Subjective Assessment - 10/25/18 1020    Subjective  Reports that she is doing fine. Reports 50% improvement in R knee since intial eval. Improved walking, getting up and down from the commode.    Pertinent History  L3-4 and L4-5 fusion, asthma, HLD    Patient Stated Goals  work on bending knee and get off elevated commode    Currently in Pain?  Yes    Pain Score  2     Pain Location  Knee    Pain Orientation  Right    Pain Descriptors / Indicators  --   stiffness   Pain Type  Acute pain         [facility] PT Assessment - 10/25/18 0001      Observation/Other Assessments   Focus on Therapeutic Outcomes  (FOTO)   Knee: 57 (43% limited, 41% predicted)      AROM   Right Knee Extension  2    Right Knee Flexion  84      PROM   Right Knee Extension  1    Right Knee Flexion  90      Strength   Right Hip Flexion  4/5    Right Hip ABduction  4-/5    Right Hip ADduction  4-/5    Left Hip Flexion  4/5    Left Hip ABduction  4-/5    Left Hip ADduction  4-/5    Right Knee Flexion  4+/5    Right Knee Extension  4/5    Left Knee Flexion  4+/5    Left Knee Extension  4+/5    Right Ankle Dorsiflexion  4+/5    Right Ankle Plantar Flexion  4/5    Left Ankle Dorsiflexion  4+/5    Left Ankle Plantar Flexion  4/5                   [facility] Adult PT Treatment/Exercise - 10/25/18 0001      Knee/Hip Exercises: Stretches   Passive  Hamstring Stretch  Right;30 seconds;2 reps    Passive Hamstring Stretch Limitations  strap     Hip Flexor Stretch  Right;2 reps;30 seconds    Hip Flexor Stretch Limitations  mod thomas with strap   cues to avoid pushing into pain     Knee/Hip Exercises: Aerobic   Nustep  L3 x 6 min LEs only      Knee/Hip Exercises: Standing   Hip Abduction  Stengthening;Right;Left;1 set;10 reps;Knee straight    Abduction Limitations  at counter top   cues to avoid anterior trunk lean   Hip Extension  Stengthening;Right;Left;1 set;10 reps;Knee straight    Extension Limitations  at counter top   cues to avoid anterior trunk lean     Knee/Hip Exercises: Supine   Bridges with [employer]  Strengthening;Both;1 set;15 reps   limited hip raise     Manual Therapy   Manual Therapy  Joint mobilization;Passive ROM;Soft tissue mobilization;Myofascial release    Joint Mobilization  R knee flexion mobilizations posteriorly on tibia grade III to tolerance              PT Education - 10/25/18 1059    Education Details  update to HEP; discussion on objective progress thus far    Person(s) Educated  Patient    Methods  Explanation;Demonstration;Tactile cues;Verbal  cues;Handout    Comprehension  Verbalized understanding;Returned demonstration       PT Short Term Goals - 10/25/18 1023      PT SHORT TERM GOAL #1   Title  Patient to be independent with initial HEP.    Time  4    Period  Weeks    Status  Achieved    Target Date  10/22/18        PT Long Term Goals - 10/25/18 1024      PT LONG TERM GOAL #1   Title  Patient to be independent with advanced HEP.    Time  8    Period  Weeks    Status  On-going   met for current     PT LONG TERM GOAL #2   Title  Patient to demonstrate R knee AROM/PROM 0-120 degrees.     Time  8    Period  Weeks    Status  On-going   R knee AROM 2-84 degrees, PROM 1-90 degrees     PT LONG TERM GOAL #3   Title  Patient to demonstrate B LE strength >=4+/5.    Time  8    Period  Weeks    Status  Partially Met   improvements demonstrated in R knee flexion and extension, L knee flexion, and R ankle DF/PF strength; most limited in B hip strength and R knee extension strength     PT LONG TERM GOAL #4   Title  Patient to demonstrate 75% improvement in symmetrical step length, weight shift, knee flexion, and toe clearance with ambulation with LRAD.    Time  8    Period  Weeks    Status  Partially Met   50% improvement in step length and knee flexion with ambulation, however demonstrating significant lateral trunk lean d/t hip instability      PT LONG TERM GOAL #5   Title  Patient to report tolerance of sitting on low commode without increase in R knee pain.    Time  8    Period  Weeks    Status  Partially Met   reports 2/10 pain with this activity  Plan - 10/25/18 1208    Clinical Impression Statement  Patient arrived to session with report of 50% improvement in symptoms since initial eval, with improvements in walking and transfers from the commode. Patient has stopped using toilet riser, and with remaining 2/10 pain in R knee with transfer to/from commode. Patient has now reached R knee AROM  2-84 degrees, PROM 1-90 degrees. Strength testing revealed improvements in R knee flexion and extension, L knee flexion, and R ankle DF/PF strength; most limited in B hip strength and R knee extension strength at this time. Patient is showing improvements in step length and knee flexion with ambulation, however still with significant lateral trunk lean d/t hip instability. Patient tolerated all manual therapy well today. Updated HEP to address B hip weakness and provided cues to maintain upright trunk and prevent compensations. Patient reported understanding and with no complaints at end of session. Patient showing good improvement towards goals at this time.     Clinical Impairments Affecting Rehab Potential  L3-4 and L4-5 fusion, asthma, HLD    PT Treatment/Interventions  ADLs/Self Care Home Management;Cryotherapy;Electrical Stimulation;Functional mobility training;Stair training;Gait training;DME Instruction;Ultrasound;Moist Heat;Therapeutic activities;Therapeutic exercise;Balance training;Neuromuscular re-education;Patient/family education;Passive range of motion;Scar mobilization;Manual techniques;Dry needling;Energy conservation;Splinting;Taping;Vasopneumatic Device    PT Next Visit Plan  address R knee flexion ROM and quad strength    Consulted and Agree with Plan of Care  Patient       Patient will benefit from skilled therapeutic intervention in order to improve the following deficits and impairments:  Hypomobility, Increased edema, Decreased scar mobility, Decreased activity tolerance, Decreased strength, Pain, Increased fascial restricitons, Difficulty walking, Decreased balance, Decreased range of motion, Improper body mechanics, Postural dysfunction, Impaired flexibility  Visit Diagnosis: Acute pain of right knee  Stiffness of right knee, not elsewhere classified  Muscle weakness (generalized)  Difficulty in walking, not elsewhere classified  Other symptoms and signs involving the  musculoskeletal system     Problem List Patient Active Problem List   Diagnosis Date Noted  . Status post total knee replacement, right 08/21/2018  . Chronic pain of both knees 07/10/2017  . Unilateral primary osteoarthritis, left knee 07/10/2017  . Unilateral primary osteoarthritis, right knee 07/10/2017    Janene Harvey, PT, DPT 10/25/18 12:16 PM   [facility] [address]  [place], [place], [address] Phone: [phone]   Fax:  [phone]  Name: Kristina Burns MRN: [id number] Date of Birth: [birth year]

## 2018-10-29 ENCOUNTER — Ambulatory Visit: Payer: Medicare Other

## 2018-10-30 ENCOUNTER — Telehealth (INDEPENDENT_AMBULATORY_CARE_PROVIDER_SITE_OTHER): Payer: Self-pay

## 2018-10-30 NOTE — Telephone Encounter (Signed)
Called patient, answered no to all prescreening questions Would not r/s appt

## 2018-10-31 ENCOUNTER — Ambulatory Visit (INDEPENDENT_AMBULATORY_CARE_PROVIDER_SITE_OTHER): Payer: Medicare Other | Admitting: Orthopaedic Surgery

## 2018-10-31 ENCOUNTER — Encounter (INDEPENDENT_AMBULATORY_CARE_PROVIDER_SITE_OTHER): Payer: Self-pay | Admitting: Orthopaedic Surgery

## 2018-10-31 ENCOUNTER — Other Ambulatory Visit: Payer: Self-pay

## 2018-10-31 DIAGNOSIS — Z96651 Presence of right artificial knee joint: Secondary | ICD-10-CM

## 2018-10-31 NOTE — Progress Notes (Signed)
The patient is now 69 days status post a right total knee arthroplasty.  She is making progress with therapy however outpatient therapy has been suspended right now in light of the coronavirus pandemic.  She feels like she is doing well.  On exam her right knee extends fully.  Her flexion is to barely past 90 degrees.  The knee feels loosely stable.  She is pleased thus far.  I would like to reevaluate her in 4 weeks to make sure she is doing well and making enough progress without needing to manipulate the knee.  I would like an AP and lateral of her right knee at that visit.

## 2018-11-01 ENCOUNTER — Encounter: Payer: Medicare Other | Admitting: Physical Therapy

## 2018-11-02 ENCOUNTER — Ambulatory Visit: Payer: Medicare Other

## 2018-11-02 ENCOUNTER — Other Ambulatory Visit: Payer: Self-pay

## 2018-11-02 DIAGNOSIS — M25561 Pain in right knee: Secondary | ICD-10-CM

## 2018-11-02 DIAGNOSIS — M25661 Stiffness of right knee, not elsewhere classified: Secondary | ICD-10-CM | POA: Diagnosis not present

## 2018-11-02 DIAGNOSIS — R29898 Other symptoms and signs involving the musculoskeletal system: Secondary | ICD-10-CM | POA: Diagnosis not present

## 2018-11-02 DIAGNOSIS — R262 Difficulty in walking, not elsewhere classified: Secondary | ICD-10-CM

## 2018-11-02 DIAGNOSIS — M6281 Muscle weakness (generalized): Secondary | ICD-10-CM

## 2018-11-02 NOTE — Therapy (Signed)
[facility] [address]  [place], [place], [address] Phone: [phone]   Fax:  [phone]  Physical Therapy Treatment  Patient Details  Name: Kristina Burns MRN: [id number] Date of Birth: [birth year] Referring Provider (PT): Jean Rosenthal, MD   Encounter Date: 11/02/2018  PT End of Session - 11/02/18 0847    Visit Number  10    Number of Visits  17    Date for PT Re-Evaluation  11/19/18    Authorization Type  Medicare BCBS    PT Start Time  0839    PT Stop Time  0928    PT Time Calculation (min)  49 min    Activity Tolerance  Patient tolerated treatment well;Patient limited by pain    Behavior During Therapy  [facility] for tasks assessed/performed       Past Medical History:  Diagnosis Date  . Arthritis   . Asthma   . High cholesterol     Past Surgical History:  Procedure Laterality Date  . ABDOMINAL HYSTERECTOMY    . BACK SURGERY    . TOTAL KNEE ARTHROPLASTY Right 08/21/2018   Procedure: RIGHT TOTAL KNEE ARTHROPLASTY;  Surgeon: Mcarthur Rossetti, MD;  Location: [facility];  Service: Orthopedics;  Laterality: Right;    There were no vitals filed for this visit.  Subjective Assessment - 11/02/18 0847    Subjective  Pt. reporting daily adherence to HEP.      Pertinent History  L3-4 and L4-5 fusion, asthma, HLD    Patient Stated Goals  work on bending knee and get off elevated commode    Currently in Pain?  No/denies    Pain Score  0-No pain    Multiple Pain Sites  No                       [facility] Adult PT Treatment/Exercise - 11/02/18 0910      Knee/Hip Exercises: Stretches   Quad Stretch  Right;3 reps;30 seconds    Quad Stretch Limitations  strap    Hip Flexor Stretch  Right;2 reps;30 seconds    Hip Flexor Stretch Limitations  mod thomas with strap    Knee: Self-Stretch to increase Flexion  --   5" x 10 reps    Knee: Self-Stretch Limitations  seated R knee flexipon stretch        Knee/Hip Exercises: Aerobic   Recumbent Bike  Lvl 1, 4 min       Knee/Hip Exercises: Standing   Heel Raises  Both;1 set;20 reps    Heel Raises Limitations  counter     Functional Squat  10 reps;3 seconds    Functional Squat Limitations  to chair with flexion stretch       Knee/Hip Exercises: Seated   Hamstring Curl  Right;10 reps    Hamstring Limitations  blue TB      Manual Therapy   Manual Therapy  Joint mobilization;Passive ROM;Soft tissue mobilization;Myofascial release;Muscle Energy Technique    Manual therapy comments  Seated, prone     Joint Mobilization  R knee A/P mobs with seated belt assistance in seated positioning with therapist overpressure for improved flexion ROM     Passive ROM  R knee flexion stretch with therapist     Muscle Energy Technique  R quad contract/relax stretch with therapist x 3 rounds              PT Education - 11/02/18 1051  Education Details  HEP update; standing knee flexion lunge stretch with UE support, prone quad stretch with strap     Person(s) Educated  Patient    Methods  Explanation;Demonstration;Verbal cues;Handout    Comprehension  Verbalized understanding;Returned demonstration;Verbal cues required;Need further instruction       PT Short Term Goals - 10/25/18 1023      PT SHORT TERM GOAL #1   Title  Patient to be independent with initial HEP.    Time  4    Period  Weeks    Status  Achieved    Target Date  10/22/18        PT Long Term Goals - 10/25/18 1024      PT LONG TERM GOAL #1   Title  Patient to be independent with advanced HEP.    Time  8    Period  Weeks    Status  On-going   met for current     PT LONG TERM GOAL #2   Title  Patient to demonstrate R knee AROM/PROM 0-120 degrees.     Time  8    Period  Weeks    Status  On-going   R knee AROM 2-84 degrees, PROM 1-90 degrees     PT LONG TERM GOAL #3   Title  Patient to demonstrate B LE strength >=4+/5.    Time  8    Period  Weeks    Status   Partially Met   improvements demonstrated in R knee flexion and extension, L knee flexion, and R ankle DF/PF strength; most limited in B hip strength and R knee extension strength     PT LONG TERM GOAL #4   Title  Patient to demonstrate 75% improvement in symmetrical step length, weight shift, knee flexion, and toe clearance with ambulation with LRAD.    Time  8    Period  Weeks    Status  Partially Met   50% improvement in step length and knee flexion with ambulation, however demonstrating significant lateral trunk lean d/t hip instability      PT LONG TERM GOAL #5   Title  Patient to report tolerance of sitting on low commode without increase in R knee pain.    Time  8    Period  Weeks    Status  Partially Met   reports 2/10 pain with this activity           Plan - 11/02/18 0848    Clinical Impression Statement  Kristina Burns reporting MD wanting her to focus on gaining flexion ROM upon recent f/u.  As in previous sessions, treatment today focused on improving flexion ROM with instruction on appropriate standing flexion stretch and MT.  Pt. otlerated all activities well today.  Ended session with pt. declinign modalities pain free.  Updated HEP (see pt. education section).  Will monitor tolerance to updated HEP in coming sessions.      Rehab Potential  Good    Clinical Impairments Affecting Rehab Potential  L3-4 and L4-5 fusion, asthma, HLD    PT Treatment/Interventions  ADLs/Self Care Home Management;Cryotherapy;Electrical Stimulation;Functional mobility training;Stair training;Gait training;DME Instruction;Ultrasound;Moist Heat;Therapeutic activities;Therapeutic exercise;Balance training;Neuromuscular re-education;Patient/family education;Passive range of motion;Scar mobilization;Manual techniques;Dry needling;Energy conservation;Splinting;Taping;Vasopneumatic Device    PT Next Visit Plan  address R knee flexion ROM and quad strength    Consulted and Agree with Plan of Care  Patient        Patient will benefit from skilled therapeutic intervention in order to improve the  following deficits and impairments:  Hypomobility, Increased edema, Decreased scar mobility, Decreased activity tolerance, Decreased strength, Pain, Increased fascial restricitons, Difficulty walking, Decreased balance, Decreased range of motion, Improper body mechanics, Postural dysfunction, Impaired flexibility  Visit Diagnosis: Acute pain of right knee  Stiffness of right knee, not elsewhere classified  Muscle weakness (generalized)  Difficulty in walking, not elsewhere classified  Other symptoms and signs involving the musculoskeletal system     Problem List Patient Active Problem List   Diagnosis Date Noted  . Status post total knee replacement, right 08/21/2018  . Chronic pain of both knees 07/10/2017  . Unilateral primary osteoarthritis, left knee 07/10/2017  . Unilateral primary osteoarthritis, right knee 07/10/2017    Bess Harvest, PTA 11/02/18 11:00 AM   [facility] [address]  [place], [place], [address] Phone: [phone]   Fax:  [phone]  Name: Kristina Burns MRN: [id number] Date of Birth: [birth year]

## 2018-11-05 ENCOUNTER — Ambulatory Visit: Payer: Medicare Other

## 2018-11-05 ENCOUNTER — Other Ambulatory Visit: Payer: Self-pay

## 2018-11-05 DIAGNOSIS — R262 Difficulty in walking, not elsewhere classified: Secondary | ICD-10-CM | POA: Diagnosis not present

## 2018-11-05 DIAGNOSIS — M25561 Pain in right knee: Secondary | ICD-10-CM

## 2018-11-05 DIAGNOSIS — M25661 Stiffness of right knee, not elsewhere classified: Secondary | ICD-10-CM

## 2018-11-05 DIAGNOSIS — M6281 Muscle weakness (generalized): Secondary | ICD-10-CM

## 2018-11-05 DIAGNOSIS — R29898 Other symptoms and signs involving the musculoskeletal system: Secondary | ICD-10-CM | POA: Diagnosis not present

## 2018-11-05 NOTE — Therapy (Signed)
[facility] [address]  [address] [place], [place], [address] Phone: [phone]   Fax:  [phone]  Physical Therapy Treatment  Patient Details  Name: Kristina Burns MRN: [id number] Date of Birth: 03/27/1946 Referring Provider (PT): Christopher Blackman, MD   Encounter Date: 11/05/2018  PT End of Session - 11/05/18 0848    Visit Number  11    Number of Visits  17    Date for PT Re-Evaluation  11/19/18    Authorization Type  Medicare BCBS    PT Start Time  0841    PT Stop Time  0928    PT Time Calculation (min)  47 min    Activity Tolerance  Patient tolerated treatment well;Patient limited by pain    Behavior During Therapy  WFL for tasks assessed/performed       Past Medical History:  Diagnosis Date  . Arthritis   . Asthma   . High cholesterol     Past Surgical History:  Procedure Laterality Date  . ABDOMINAL HYSTERECTOMY    . BACK SURGERY    . TOTAL KNEE ARTHROPLASTY Right 08/21/2018   Procedure: RIGHT TOTAL KNEE ARTHROPLASTY;  Surgeon: Blackman, Christopher Y, MD;  Location: [facility];  Service: Orthopedics;  Laterality: Right;    There were no vitals filed for this visit.  Subjective Assessment - 11/05/18 0847    Subjective  Doing well today.      Pertinent History  L3-4 and L4-5 fusion, asthma, HLD    Patient Stated Goals  work on bending knee and get off elevated commode    Currently in Pain?  No/denies    Pain Score  0-No pain    Multiple Pain Sites  No         [facility] PT Assessment - 11/05/18 0001      AROM   Right Knee Flexion  90      PROM   Right Knee Flexion  95                   [facility] Adult PT Treatment/Exercise - 11/05/18 0857      Knee/Hip Exercises: Stretches   Quad Stretch  Right;60 seconds;2 reps    Quad Stretch Limitations  strap+ bolster under thigh     Knee: Self-Stretch to increase Flexion  --   R knee lunge flexion stretch to 6" step 5" x 10 reps    Knee:  Self-Stretch Limitations  standing      Knee/Hip Exercises: Aerobic   Recumbent Bike  Lvl 1, 6 min - partial revolutions for ROM       Knee/Hip Exercises: Machines for Strengthening   Cybex Leg Press  B LE's: 25# x 20 reps; 5 sec hold for flexion stretch       Knee/Hip Exercises: Seated   Hamstring Curl  Right   x 12 reps   Hamstring Limitations  blue TB      Manual Therapy   Manual Therapy  Joint mobilization;Passive ROM;Muscle Energy Technique    Manual therapy comments  prone     Joint Mobilization  R patellar mobs all directions for improved ROM     Passive ROM  R knee flexion stretch with therapist     Muscle Energy Technique  prone R quad contract/relax stretch with therapist x 3 rounds                PT Short Term Goals - 10/25/18 1023        PT SHORT TERM GOAL #1   Title  Patient to be independent with initial HEP.    Time  4    Period  Weeks    Status  Achieved    Target Date  10/22/18        PT Long Term Goals - 10/25/18 1024      PT LONG TERM GOAL #1   Title  Patient to be independent with advanced HEP.    Time  8    Period  Weeks    Status  On-going   met for current     PT LONG TERM GOAL #2   Title  Patient to demonstrate R knee AROM/PROM 0-120 degrees.     Time  8    Period  Weeks    Status  On-going   R knee AROM 2-84 degrees, PROM 1-90 degrees     PT LONG TERM GOAL #3   Title  Patient to demonstrate B LE strength >=4+/5.    Time  8    Period  Weeks    Status  Partially Met   improvements demonstrated in R knee flexion and extension, L knee flexion, and R ankle DF/PF strength; most limited in B hip strength and R knee extension strength     PT LONG TERM GOAL #4   Title  Patient to demonstrate 75% improvement in symmetrical step length, weight shift, knee flexion, and toe clearance with ambulation with LRAD.    Time  8    Period  Weeks    Status  Partially Met   50% improvement in step length and knee flexion with ambulation, however  demonstrating significant lateral trunk lean d/t hip instability      PT LONG TERM GOAL #5   Title  Patient to report tolerance of sitting on low commode without increase in R knee pain.    Time  8    Period  Weeks    Status  Partially Met   reports 2/10 pain with this activity           Plan - 11/05/18 0854    Clinical Impression Statement  Kristina Burns reporting she has been consistent with HEP over weekend except skipped prone quad stretch due to difficulty with strap.  Pt. encouraged to perform prone quad stretch at home and reviewed this activity with pt. today.  Session focused on functional knee flexion activities and MT focused on improving knee flexion ROM.  Pt. tolerated all activities well in session.  Encouraged pt. to focus on overpressure into flexion lunge stretch at home with pt. requiring cueing to push into appropriate stretch.  Ended session with pt. reporting she was pain free.  Able to demo improved R knee flexion PROM to 95 dg today following MT and therex.  Will continue to progress toward goals.      Rehab Potential  Good    Clinical Impairments Affecting Rehab Potential  L3-4 and L4-5 fusion, asthma, HLD    PT Treatment/Interventions  ADLs/Self Care Home Management;Cryotherapy;Electrical Stimulation;Functional mobility training;Stair training;Gait training;DME Instruction;Ultrasound;Moist Heat;Therapeutic activities;Therapeutic exercise;Balance training;Neuromuscular re-education;Patient/family education;Passive range of motion;Scar mobilization;Manual techniques;Dry needling;Energy conservation;Splinting;Taping;Vasopneumatic Device    PT Next Visit Plan  address R knee flexion ROM and quad strength    Consulted and Agree with Plan of Care  Patient       Patient will benefit from skilled therapeutic intervention in order to improve the following deficits and impairments:  Hypomobility, Increased edema, Decreased scar mobility, Decreased activity tolerance, Decreased    strength, Pain, Increased fascial restricitons, Difficulty walking, Decreased balance, Decreased range of motion, Improper body mechanics, Postural dysfunction, Impaired flexibility  Visit Diagnosis: Acute pain of right knee  Stiffness of right knee, not elsewhere classified  Muscle weakness (generalized)  Difficulty in walking, not elsewhere classified  Other symptoms and signs involving the musculoskeletal system     Problem List Patient Active Problem List   Diagnosis Date Noted  . Status post total knee replacement, right 08/21/2018  . Chronic pain of both knees 07/10/2017  . Unilateral primary osteoarthritis, left knee 07/10/2017  . Unilateral primary osteoarthritis, right knee 07/10/2017    Micah Denny, PTA 11/05/18 11:09 AM    [facility] [address]  [address] [place], [place], [address] Phone: [phone]   Fax:  [phone]  Name: Kristina Burns MRN: [id number] Date of Birth: 08/24/1945   

## 2018-11-08 ENCOUNTER — Encounter: Payer: Medicare Other | Admitting: Physical Therapy

## 2018-11-14 ENCOUNTER — Ambulatory Visit: Payer: Medicare Other | Attending: Orthopaedic Surgery

## 2018-11-14 ENCOUNTER — Other Ambulatory Visit: Payer: Self-pay

## 2018-11-14 DIAGNOSIS — R29898 Other symptoms and signs involving the musculoskeletal system: Secondary | ICD-10-CM | POA: Diagnosis not present

## 2018-11-14 DIAGNOSIS — M25661 Stiffness of right knee, not elsewhere classified: Secondary | ICD-10-CM | POA: Diagnosis not present

## 2018-11-14 DIAGNOSIS — M25561 Pain in right knee: Secondary | ICD-10-CM | POA: Diagnosis not present

## 2018-11-14 DIAGNOSIS — R262 Difficulty in walking, not elsewhere classified: Secondary | ICD-10-CM

## 2018-11-14 DIAGNOSIS — M6281 Muscle weakness (generalized): Secondary | ICD-10-CM

## 2018-11-14 NOTE — Therapy (Signed)
[facility] [address]  [place], [place], [address] Phone: [phone]   Fax:  [phone]  Physical Therapy Treatment  Patient Details  Name: Kristina Burns MRN: [id number] Date of Birth: [birth year] Referring Provider (PT): Jean Rosenthal, MD   Encounter Date: 11/14/2018  PT End of Session - 11/14/18 1105    Visit Number  12    Number of Visits  17    Date for PT Re-Evaluation  11/19/18    Authorization Type  Medicare BCBS    PT Start Time  1100    PT Stop Time  1155    PT Time Calculation (min)  55 min    Activity Tolerance  Patient tolerated treatment well;Patient limited by pain    Behavior During Therapy  [facility] for tasks assessed/performed       Past Medical History:  Diagnosis Date  . Arthritis   . Asthma   . High cholesterol     Past Surgical History:  Procedure Laterality Date  . ABDOMINAL HYSTERECTOMY    . BACK SURGERY    . TOTAL KNEE ARTHROPLASTY Right 08/21/2018   Procedure: RIGHT TOTAL KNEE ARTHROPLASTY;  Surgeon: Mcarthur Rossetti, MD;  Location: [place];  Service: Orthopedics;  Laterality: Right;    There were no vitals filed for this visit.  Subjective Assessment - 11/14/18 1108    Subjective  Pt. reporting she has been performing HEP daily and performing daily prone quad stretch.      Pertinent History  L3-4 and L4-5 fusion, asthma, HLD    Patient Stated Goals  work on bending knee and get off elevated commode    Currently in Pain?  Yes    Pain Score  2     Pain Location  Knee    Pain Orientation  Right    Pain Descriptors / Indicators  Aching    Pain Type  Acute pain    Multiple Pain Sites  No         [facility] PT Assessment - 11/14/18 0001      AROM   Right Knee Flexion  98      PROM   Right Knee Flexion  102                   [facility] Adult PT Treatment/Exercise - 11/14/18 0001      Knee/Hip Exercises: Stretches   Mining engineer Limitations  strap       Knee/Hip Exercises: Aerobic   Recumbent Bike  Lvl 1, 6 min - Full revolutions       Knee/Hip Exercises: Standing   Heel Raises  Both;1 set;20 reps    Heel Raises Limitations  counter     Hip Flexion  Right;Left;10 reps;Knee straight;Stengthening    Hip Flexion Limitations  yellow looped TB at ankles; counter     Hip Abduction  Right;Left;Knee straight;Stengthening   x 12 reps    Abduction Limitations  yellow TB at ankles; counter     Hip Extension  Right;Left;Stengthening;Knee straight   x 12 reps   Extension Limitations  yellow looped TB at ankles; counter     Functional Squat  15 reps;3 seconds    Functional Squat Limitations  to chair with flexion stretch     SLS  R SLS x 10 sec   difficulty maintaining balance with 1 UE     Manual Therapy   Manual  Therapy  Joint mobilization;Passive ROM;Muscle Energy Technique    Manual therapy comments  prone     Joint Mobilization  R patellar mobs all directions for improved ROM     Passive ROM  R knee flexion stretch with therapist     Muscle Energy Technique  prone R quad contract/relax stretch with therapist x 3 rounds                PT Short Term Goals - 10/25/18 1023      PT SHORT TERM GOAL #1   Title  Patient to be independent with initial HEP.    Time  4    Period  Weeks    Status  Achieved    Target Date  10/22/18        PT Long Term Goals - 10/25/18 1024      PT LONG TERM GOAL #1   Title  Patient to be independent with advanced HEP.    Time  8    Period  Weeks    Status  On-going   met for current     PT LONG TERM GOAL #2   Title  Patient to demonstrate R knee AROM/PROM 0-120 degrees.     Time  8    Period  Weeks    Status  On-going   R knee AROM 2-84 degrees, PROM 1-90 degrees     PT LONG TERM GOAL #3   Title  Patient to demonstrate B LE strength >=4+/5.    Time  8    Period  Weeks    Status  Partially Met   improvements demonstrated in R knee flexion and  extension, L knee flexion, and R ankle DF/PF strength; most limited in B hip strength and R knee extension strength     PT LONG TERM GOAL #4   Title  Patient to demonstrate 75% improvement in symmetrical step length, weight shift, knee flexion, and toe clearance with ambulation with LRAD.    Time  8    Period  Weeks    Status  Partially Met   50% improvement in step length and knee flexion with ambulation, however demonstrating significant lateral trunk lean d/t hip instability      PT LONG TERM GOAL #5   Title  Patient to report tolerance of sitting on low commode without increase in R knee pain.    Time  8    Period  Weeks    Status  Partially Met   reports 2/10 pain with this activity           Plan - 11/14/18 1106    Clinical Impression Statement  [name] doing well today.  Reports she has been "pushing harder" with home program and performing prone quad stretch daily with HEP.  Able to demo improved R knee ROM today AROM flexion 98 dg, PROM flexion 103 dg.  Pt. encouraged to, "keep up the good work" and session focusing on MT and therex for improved flexion ROM and proximal hip strengthening for improved gait mechanics.  Pt. now states she is ambulating around home without cane.  Ended visit with pt. denying need for modalities reporting she plans to ice at home.  Will continue to progress toward goals.      Rehab Potential  Good    Clinical Impairments Affecting Rehab Potential  L3-4 and L4-5 fusion, asthma, HLD    PT Treatment/Interventions  ADLs/Self Care Home Management;Cryotherapy;Electrical Stimulation;Functional mobility training;Stair training;Gait training;DME Instruction;Ultrasound;Moist Heat;Therapeutic activities;Therapeutic exercise;Balance  training;Neuromuscular re-education;Patient/family education;Passive range of motion;Scar mobilization;Manual techniques;Dry needling;Energy conservation;Splinting;Taping;Vasopneumatic Device    PT Next Visit Plan  address R knee flexion  ROM and quad strength    Consulted and Agree with Plan of Care  Patient       Patient will benefit from skilled therapeutic intervention in order to improve the following deficits and impairments:  Hypomobility, Increased edema, Decreased scar mobility, Decreased activity tolerance, Decreased strength, Pain, Increased fascial restricitons, Difficulty walking, Decreased balance, Decreased range of motion, Improper body mechanics, Postural dysfunction, Impaired flexibility  Visit Diagnosis: Acute pain of right knee  Stiffness of right knee, not elsewhere classified  Muscle weakness (generalized)  Difficulty in walking, not elsewhere classified  Other symptoms and signs involving the musculoskeletal system     Problem List Patient Active Problem List   Diagnosis Date Noted  . Status post total knee replacement, right 08/21/2018  . Chronic pain of both knees 07/10/2017  . Unilateral primary osteoarthritis, left knee 07/10/2017  . Unilateral primary osteoarthritis, right knee 07/10/2017    Bess Harvest, PTA 11/14/18 12:04 PM   [facility] [address]  [place], [place], [address] Phone: [phone]   Fax:  [phone]  Name: Latrina Guttman MRN: [id number] Date of Birth: 1946/02/19

## 2018-11-19 ENCOUNTER — Ambulatory Visit: Payer: Medicare Other

## 2018-11-19 ENCOUNTER — Other Ambulatory Visit: Payer: Self-pay

## 2018-11-19 DIAGNOSIS — M25661 Stiffness of right knee, not elsewhere classified: Secondary | ICD-10-CM

## 2018-11-19 DIAGNOSIS — R262 Difficulty in walking, not elsewhere classified: Secondary | ICD-10-CM | POA: Diagnosis not present

## 2018-11-19 DIAGNOSIS — R29898 Other symptoms and signs involving the musculoskeletal system: Secondary | ICD-10-CM

## 2018-11-19 DIAGNOSIS — M6281 Muscle weakness (generalized): Secondary | ICD-10-CM

## 2018-11-19 DIAGNOSIS — M25561 Pain in right knee: Secondary | ICD-10-CM | POA: Diagnosis not present

## 2018-11-19 NOTE — Therapy (Addendum)
[facility] [address]  [place], [place], [address] Phone: [phone]   Fax:  [phone]  Physical Therapy Treatment  Patient Details  Name: Kristina Burns MRN: [id number] Date of Birth: 12-15-1945 Referring Provider (PT): Jean Rosenthal, MD   Progress Note Reporting Period 11/02/18 to 11/19/18  See note below for Objective Data and Assessment of Progress/Goals.    Encounter Date: 11/19/2018  PT End of Session - 11/19/18 1409    Visit Number  13    Number of Visits  19    Date for PT Re-Evaluation  12/31/18    Authorization Type  Medicare BCBS    PT Start Time  1402    PT Stop Time  1450    PT Time Calculation (min)  48 min    Activity Tolerance  Patient tolerated treatment well;Patient limited by pain    Behavior During Therapy  WFL for tasks assessed/performed       Past Medical History:  Diagnosis Date  . Arthritis   . Asthma   . High cholesterol     Past Surgical History:  Procedure Laterality Date  . ABDOMINAL HYSTERECTOMY    . BACK SURGERY    . TOTAL KNEE ARTHROPLASTY Right 08/21/2018   Procedure: RIGHT TOTAL KNEE ARTHROPLASTY;  Surgeon: Mcarthur Rossetti, MD;  Location: [place];  Service: Orthopedics;  Laterality: Right;    There were no vitals filed for this visit.  Subjective Assessment - 11/19/18 1413    Subjective  Pt. reporting she has been doing her home program daily.      Pertinent History  L3-4 and L4-5 fusion, asthma, HLD    Patient Stated Goals  work on bending knee and get off elevated commode    Currently in Pain?  Yes    Pain Score  1     Pain Location  Knee    Pain Orientation  Right    Pain Descriptors / Indicators  Aching    Pain Type  Acute pain    Multiple Pain Sites  No         [facility] PT Assessment - 11/20/18 0001      Assessment   Medical Diagnosis  s/p R TKA    Referring Provider (PT)  Jean Rosenthal, MD    Onset Date/Surgical Date   08/21/18      AROM   Right/Left Knee  Right    Right Knee Extension  1    Right Knee Flexion  101      PROM   Right/Left Knee  Right    Right Knee Extension  1    Right Knee Flexion  105      Strength   Right/Left Hip  Right;Left    Right Hip Flexion  4/5    Right Hip ABduction  4/5    Right Hip ADduction  4-/5    Left Hip Flexion  4/5    Left Hip ABduction  4-/5    Left Hip ADduction  4/5    Right/Left Knee  Right;Left    Right Knee Flexion  4/5    Right Knee Extension  4+/5    Left Knee Flexion  4+/5    Left Knee Extension  4+/5                   [facility] Adult PT Treatment/Exercise - 11/19/18 1417      Knee/Hip Exercises: Stretches   Quad Stretch  Right;60 seconds;2  reps    Sports administrator Limitations  strap     Hip Flexor Stretch  Right;1 rep;60 seconds    Hip Flexor Stretch Limitations  mod thomas position with strap       Knee/Hip Exercises: Aerobic   Recumbent Bike  Lvl 1, 6 min - Full revolutions       Knee/Hip Exercises: Machines for Strengthening   Cybex Leg Press  B LE's: 25# x 20 reps; 5 sec hold for flexion stretch       Knee/Hip Exercises: Standing   Heel Raises  Both;1 set;20 reps    Heel Raises Limitations  counter     Knee Flexion  Left;Right;10 reps;Strengthening    Knee Flexion Limitations  2#             PT Education - 11/19/18 1500    Education Details  HEP update     Person(s) Educated  Patient    Methods  Explanation;Demonstration;Verbal cues;Handout    Comprehension  Verbalized understanding;Returned demonstration;Need further instruction;Verbal cues required       PT Short Term Goals - 11/19/18 1112      PT SHORT TERM GOAL #1   Title  Patient to be independent with initial HEP.    Time  4    Period  Weeks    Status  Achieved    Target Date  10/22/18        PT Long Term Goals - 11/19/18 1438      PT LONG TERM GOAL #1   Title  Patient to be independent with advanced HEP.    Time  6    Period  Weeks    Status   Partially Met   met for current   Target Date  12/31/18      PT LONG TERM GOAL #2   Title  Patient to demonstrate R knee AROM/PROM 0-120 degrees.     Time  6    Period  Weeks    Status  On-going   R knee AROM 2-102 degrees, PROM 1-105 degrees   Target Date  12/31/18      PT LONG TERM GOAL #3   Title  Patient to demonstrate B LE strength >=4+/5.    Time  6    Period  Weeks    Status  Partially Met    Target Date  12/08/18      PT LONG TERM GOAL #4   Title  Patient to demonstrate 75% improvement in symmetrical step length, weight shift, knee flexion, and toe clearance with ambulation with LRAD.    Time  6    Period  Weeks    Status  Partially Met   50% improvement in step length and knee flexion with ambulation, however demonstrating significant lateral trunk lean d/t hip instability    Target Date  12/31/18      PT LONG TERM GOAL #5   Title  Patient to report tolerance of sitting on low commode without increase in R knee pain.    Time  8    Period  Weeks    Status  Achieved   reports pain free           Plan - 11/19/18 1409    Clinical Impression Statement  Pt. making good progress with therapy.  Able to demonstrate progress toward ROM LTG today R knee AROM 2-102 dg, PROM 1-105 dg.  Able to demo progress in proximal hip strengthening now grossly 4-/5-4/5 strength with MMT and R  knee 4/5-4+/5.  Pt. HEP updated to target remaining strength deficits and pt. encouraged to continue daily ROM/LE stretching.  Pt. reporting she feels she has achieved LTG #5 with good tolerance for sitting on low commode and no longer reporting R knee pain with this.  Pt. gait pattern normalizing however requiring cueing for increased B step length, upright posture, and B UE swing.   Pt. will continue to benefit from further skilled therapy to maximize functional mobility and strength.      Rehab Potential  Good    Clinical Impairments Affecting Rehab Potential  L3-4 and L4-5 fusion, asthma, HLD     PT Frequency  1x / week    PT Duration  6 weeks    PT Treatment/Interventions  ADLs/Self Care Home Management;Cryotherapy;Electrical Stimulation;Functional mobility training;Stair training;Gait training;DME Instruction;Ultrasound;Moist Heat;Therapeutic activities;Therapeutic exercise;Balance training;Neuromuscular re-education;Patient/family education;Passive range of motion;Scar mobilization;Manual techniques;Dry needling;Energy conservation;Splinting;Taping;Vasopneumatic Device    Consulted and Agree with Plan of Care  Patient       Patient will benefit from skilled therapeutic intervention in order to improve the following deficits and impairments:  Hypomobility, Increased edema, Decreased scar mobility, Decreased activity tolerance, Decreased strength, Pain, Increased fascial restricitons, Difficulty walking, Decreased balance, Decreased range of motion, Improper body mechanics, Postural dysfunction, Impaired flexibility  Visit Diagnosis: Acute pain of right knee  Stiffness of right knee, not elsewhere classified  Muscle weakness (generalized)  Difficulty in walking, not elsewhere classified  Other symptoms and signs involving the musculoskeletal system     Problem List Patient Active Problem List   Diagnosis Date Noted  . Status post total knee replacement, right 08/21/2018  . Chronic pain of both knees 07/10/2017  . Unilateral primary osteoarthritis, left knee 07/10/2017  . Unilateral primary osteoarthritis, right knee 07/10/2017    Bess Harvest, PTA 11/20/18 11:18 AM   [facility] [address]  [place], [place], [address] Phone: [phone]   Fax:  [phone]  Name: Vung Kush MRN: [id number] Date of Birth: [birth year]   Patient is showing slow but steady progress with R knee flexion ROM and LE strength. Patient still lacking significant ROM in R knee required for normal gait pattern without  compensations. Her B hip weakness is also contributing to gait deviations. Thus, patient would benefit from continued skilled PT services 1x/week for 6 weeks to address remaining goals.   Janene Harvey, PT, DPT 11/20/18 11:18 AM   PHYSICAL THERAPY DISCHARGE SUMMARY  Visits from Start of Care: 13  Current functional level related to goals / functional outcomes: See above clinical impression    Remaining deficits: Decreased ROM, LE strength, gait deviations   Education / Equipment: HEP  Plan: Patient agrees to discharge.  Patient goals were partially met. Patient is being discharged due to being pleased with the current functional level.  ?????     Janene Harvey, PT, DPT 01/09/19 1:38 PM

## 2018-11-20 NOTE — Addendum Note (Signed)
Addended by: Janene Harvey D on: 11/20/2018 11:20 AM   Modules accepted: Orders

## 2018-11-26 ENCOUNTER — Ambulatory Visit: Payer: Medicare Other | Admitting: Physical Therapy

## 2018-11-28 ENCOUNTER — Other Ambulatory Visit: Payer: Self-pay

## 2018-11-28 ENCOUNTER — Ambulatory Visit (INDEPENDENT_AMBULATORY_CARE_PROVIDER_SITE_OTHER): Payer: Medicare Other | Admitting: Orthopaedic Surgery

## 2018-11-28 ENCOUNTER — Encounter (INDEPENDENT_AMBULATORY_CARE_PROVIDER_SITE_OTHER): Payer: Self-pay | Admitting: Orthopaedic Surgery

## 2018-11-28 DIAGNOSIS — Z96651 Presence of right artificial knee joint: Secondary | ICD-10-CM

## 2018-11-28 NOTE — Progress Notes (Signed)
The patient is now 3 months status post a right total knee arthroplasty.  She is protrusive well through physical therapy and has flexed her knee to just past 100 degrees.  She is very pleased with how her knee is doing and how she is doing from a pain standpoint.  She does state that physical therapy would like to extend her therapy for 4 more weeks but she says that that is too expensive for her from a co-pay standpoint.  She did bring with me a whole list of the home exercise routine that she is doing daily.  She is very confident that she can do this on her own.  On exam her right knee shows minimal swelling.  Her incisions well-healed.  Her extension is full.  Her flexion is to 100 degrees.  The knee feels ligamentously stable.  I do feel that the patient is very motivated and will push herself on her own.  I agree with her stopping outpatient physical therapy since it is too expensive for her right now as well.  All questions and concerns were answered and addressed.  I would like to see her back in 3 months with a standing AP and lateral of her right operative knee.

## 2018-12-03 ENCOUNTER — Ambulatory Visit: Payer: Medicare Other | Admitting: Physical Therapy

## 2018-12-17 ENCOUNTER — Encounter: Payer: Medicare Other | Admitting: Physical Therapy

## 2019-01-23 DIAGNOSIS — Z79899 Other long term (current) drug therapy: Secondary | ICD-10-CM | POA: Diagnosis not present

## 2019-01-23 DIAGNOSIS — M859 Disorder of bone density and structure, unspecified: Secondary | ICD-10-CM | POA: Diagnosis not present

## 2019-01-23 DIAGNOSIS — E7800 Pure hypercholesterolemia, unspecified: Secondary | ICD-10-CM | POA: Diagnosis not present

## 2019-01-25 DIAGNOSIS — R82998 Other abnormal findings in urine: Secondary | ICD-10-CM | POA: Diagnosis not present

## 2019-02-27 ENCOUNTER — Ambulatory Visit: Payer: Self-pay

## 2019-02-27 ENCOUNTER — Encounter: Payer: Self-pay | Admitting: Orthopaedic Surgery

## 2019-02-27 ENCOUNTER — Ambulatory Visit (INDEPENDENT_AMBULATORY_CARE_PROVIDER_SITE_OTHER): Payer: Medicare Other | Admitting: Orthopaedic Surgery

## 2019-02-27 ENCOUNTER — Other Ambulatory Visit: Payer: Self-pay

## 2019-02-27 DIAGNOSIS — Z96651 Presence of right artificial knee joint: Secondary | ICD-10-CM | POA: Diagnosis not present

## 2019-02-27 NOTE — Progress Notes (Signed)
The patient is now 77-month status post a right total knee arthroplasty.  She is very pleased with his knee overall.  She does have known arthritis in her left knee but does not really bothering her enough.  She denies any issues at all.  On exam she has full extension of her right operative knee the incisions healed nicely.  The flexion though is only to about 95 but she is pleased with this.  There is some slight laxity on exam but she is not symptomatic from this.  She denies any instability issues.  There is no swelling or effusion.  2 views of the right knee show well-seated implant with no complicating features.  The AP view also shows her left knee and there is medial joint space narrowing and arthritis.  At this point we do not need to see her back for 6 months.  I would like an AP and lateral both knees at that visit.  All question concerns were answered and addressed.  If there is issues before then she will let us know.

## 2019-04-17 DIAGNOSIS — Z23 Encounter for immunization: Secondary | ICD-10-CM | POA: Diagnosis not present

## 2019-07-03 ENCOUNTER — Other Ambulatory Visit: Payer: Self-pay | Admitting: Internal Medicine

## 2019-07-03 DIAGNOSIS — Z1231 Encounter for screening mammogram for malignant neoplasm of breast: Secondary | ICD-10-CM

## 2019-08-27 ENCOUNTER — Other Ambulatory Visit: Payer: Self-pay

## 2019-08-27 ENCOUNTER — Ambulatory Visit
Admission: RE | Admit: 2019-08-27 | Discharge: 2019-08-27 | Disposition: A | Discharge status: Home / Self Care | Payer: Medicare Other | Source: Ambulatory Visit | Attending: Internal Medicine | Admitting: Internal Medicine

## 2019-08-27 DIAGNOSIS — Z1231 Encounter for screening mammogram for malignant neoplasm of breast: Secondary | ICD-10-CM

## 2019-08-29 ENCOUNTER — Ambulatory Visit: Payer: Medicare Other | Admitting: Orthopaedic Surgery

## 2019-10-03 ENCOUNTER — Ambulatory Visit: Payer: Medicare Other

## 2019-10-04 ENCOUNTER — Ambulatory Visit: Payer: Medicare Other

## 2019-11-04 DIAGNOSIS — N390 Urinary tract infection, site not specified: Secondary | ICD-10-CM | POA: Diagnosis not present

## 2019-11-04 DIAGNOSIS — R300 Dysuria: Secondary | ICD-10-CM | POA: Diagnosis not present

## 2020-01-22 ENCOUNTER — Encounter: Payer: Self-pay | Admitting: Gastroenterology

## 2020-01-29 DIAGNOSIS — Z0000 Encounter for general adult medical examination without abnormal findings: Secondary | ICD-10-CM | POA: Diagnosis not present

## 2020-01-29 DIAGNOSIS — M859 Disorder of bone density and structure, unspecified: Secondary | ICD-10-CM | POA: Diagnosis not present

## 2020-01-29 DIAGNOSIS — E7800 Pure hypercholesterolemia, unspecified: Secondary | ICD-10-CM | POA: Diagnosis not present

## 2020-02-05 DIAGNOSIS — R82998 Other abnormal findings in urine: Secondary | ICD-10-CM | POA: Diagnosis not present

## 2020-02-05 DIAGNOSIS — Z1212 Encounter for screening for malignant neoplasm of rectum: Secondary | ICD-10-CM | POA: Diagnosis not present

## 2020-02-11 ENCOUNTER — Other Ambulatory Visit: Payer: Self-pay

## 2020-02-11 ENCOUNTER — Ambulatory Visit (AMBULATORY_SURGERY_CENTER): Payer: Self-pay | Admitting: *Deleted

## 2020-02-11 VITALS — Ht 62.0 in | Wt 186.0 lb

## 2020-02-11 DIAGNOSIS — Z1211 Encounter for screening for malignant neoplasm of colon: Secondary | ICD-10-CM

## 2020-02-11 NOTE — Progress Notes (Signed)

## 2020-02-14 ENCOUNTER — Encounter: Payer: Self-pay | Admitting: Gastroenterology

## 2020-02-28 ENCOUNTER — Ambulatory Visit (AMBULATORY_SURGERY_CENTER): Payer: Medicare Other | Admitting: Gastroenterology

## 2020-02-28 ENCOUNTER — Other Ambulatory Visit: Payer: Self-pay

## 2020-02-28 ENCOUNTER — Encounter: Payer: Self-pay | Admitting: Gastroenterology

## 2020-02-28 ENCOUNTER — Other Ambulatory Visit: Payer: Self-pay | Admitting: Gastroenterology

## 2020-02-28 VITALS — BP 127/74 | HR 72 | Temp 97.8°F | Resp 17 | Ht 62.0 in | Wt 186.0 lb

## 2020-02-28 DIAGNOSIS — K639 Disease of intestine, unspecified: Secondary | ICD-10-CM

## 2020-02-28 DIAGNOSIS — D123 Benign neoplasm of transverse colon: Secondary | ICD-10-CM

## 2020-02-28 DIAGNOSIS — Z1211 Encounter for screening for malignant neoplasm of colon: Secondary | ICD-10-CM | POA: Diagnosis not present

## 2020-02-28 DIAGNOSIS — K5730 Diverticulosis of large intestine without perforation or abscess without bleeding: Secondary | ICD-10-CM

## 2020-02-28 MED ORDER — SODIUM CHLORIDE 0.9 % IV SOLN: 500.0000 mL | Freq: Once | INTRAVENOUS | Status: DC

## 2020-02-28 NOTE — Op Note (Signed)
[facility] Patient Name: Kristina Burns Procedure Date: 02/28/2020 12:58 PM MRN: [id number] Endoscopist: Gerrit Heck , MD Age: 74 Referring MD:  Date of Birth: 1945-12-10 Gender: Female Account #: [id number] Procedure:                Colonoscopy Indications:              Screening for colorectal malignant neoplasm (last                            colonoscopy was 10 years ago)                           Last colonoscopy in 2011 with diverticulosis, with                            recommendation to repeat in 10 years. Otherwise, no                            GI symptoms. Medicines:                Monitored Anesthesia Care Procedure:                Pre-Anesthesia Assessment:                           - Prior to the procedure, a History and Physical                            was performed, and patient medications and                            allergies were reviewed. The patient's tolerance of                            previous anesthesia was also reviewed. The risks                            and benefits of the procedure and the sedation                            options and risks were discussed with the patient.                            All questions were answered, and informed consent                            was obtained. Prior Anticoagulants: The patient has                            taken no previous anticoagulant or antiplatelet                            agents. ASA Grade Assessment: II - A patient with  mild systemic disease. After reviewing the risks                            and benefits, the patient was deemed in                            satisfactory condition to undergo the procedure.                           After obtaining informed consent, the colonoscope                            was passed under direct vision. Throughout the                            procedure, the patient's blood pressure, pulse, and                             oxygen saturations were monitored continuously. The                            Colonoscope was introduced through the anus and                            advanced to the the terminal ileum. The colonoscopy                            was performed without difficulty. The patient                            tolerated the procedure well. The quality of the                            bowel preparation was excellent. The terminal                            ileum, ileocecal valve, appendiceal orifice, and                            rectum were photographed. Scope In: 1:37:08 PM Scope Out: 1:58:48 PM Scope Withdrawal Time: 0 hours 19 minutes 2 seconds  Total Procedure Duration: 0 hours 21 minutes 40 seconds  Findings:                 The perianal and digital rectal examinations were                            normal.                           A 20 mm polyp was found in the splenic flexure. The                            polyp was flat. Based on location and anticipated  need for advanced techniques to accomplish                            endoscopic resection, the edges of the lesion were                            biopsied with a cold forceps for histology. Area 3                            cm distal and 3 cm proximal to the lesion was                            tattooed with an injection of 2.5 mL of Spot                            (carbon black). Tattoo placed on mesenteric and                            anti-mesenteric sides. Estimated blood loss was                            minimal.                           A localized area of multiple, flat, apparent                            submucosal white spots were noted in a localized                            area of the proximal descending colon, splenic                            flexure, and distal transverse colon. These were                            located in close proximity to the above polypoid                             lesion. These were noted on advancement of the                            colonoscope (ie, on the way in), and still present                            on withdrawal. The vascularity appeared normal                            through these areas, suggestive of a submucosal (oe                            possibly extraluminal) process. Mucosal biopsies  were taken with a cold forceps for histology.                            Estimated blood loss was minimal.                           Multiple small and large-mouthed diverticula were                            found in the sigmoid colon, ascending colon and                            cecum.                           The retroflexed view of the distal rectum and anal                            verge was normal and showed no anal or rectal                            abnormalities.                           The terminal ileum appeared normal. Complications:            No immediate complications. Estimated Blood Loss:     Estimated blood loss was minimal. Impression:               - One large, 20 mm, flat polyp found at the splenic                            flexure. This could not be removed endoscopically                            today, in favor of advanced Endoscopic Mucosal                            Resection (EMR) at the hospital vs surgical                            resection. The edges of the lesion were biopsied to                            preserve ability to perform EMR. The area proximal                            and distal to the lesion was tattooed on the                            mesenteric and anti-mesenteric sides.                           - Multiple, flat,white spots were noted in a  localized area of the proximal descending colon,                            splenic flexure, and distal transverse colon. These                            were  located in close proximity to the above                            polypoid lesion. These were noted on advancement of                            the colonoscope (ie, on the way in), and still                            present on withdrawal. The endoscopic appearance is                            suggestive of submucosal or possibly extraluminal                            origin. This was biopsied.                           - Diverticulosis in the sigmoid colon, in the                            ascending colon and in the cecum.                           - The distal rectum and anal verge are normal on                            retroflexion view.                           - The examined portion of the ileum was normal. Recommendation:           - Patient has a contact number available for                            emergencies. The signs and symptoms of potential                            delayed complications were discussed with the                            patient. Return to normal activities tomorrow.                            Written discharge instructions were provided to the                            patient.                           -  Resume previous diet.                           - Continue present medications.                           - Await pathology results.                           - Perform a CT scan (computed tomography) of                            abdomen and pelvis with contrast at appointment to                            be scheduled.                           - Schedule follow-up appointment with me in the GI                            Clinic to discuss endoscopic resection vs surgical                            resection of the polypoid lesion and to review                            results of the CT scan. Gerrit Heck, MD 02/28/2020 2:18:19 PM

## 2020-02-28 NOTE — Progress Notes (Signed)
Pt's states no medical or surgical changes since previsit or office visit.  V/S-CW  CHECK-IN-JB

## 2020-02-28 NOTE — Patient Instructions (Signed)
Impression/Recommendations:  Polyp handout given to patient. Diverticulosis handout given to patient.  Resume previous diet. Continue present medications. Await pathology results.  Perform CT scan of abdomen and pelvis with contrast at appointment to be scheduled.  Schedule follow-up appointment in GI clinic to discuss endoscopic resection vs. Surgical resection and review results of CT scan.  YOU HAD AN ENDOSCOPIC PROCEDURE TODAY AT [facility]:   Refer to the procedure report that was given to you for any specific questions about what was found during the examination.  If the procedure report does not answer your questions, please call your gastroenterologist to clarify.  If you requested that your care partner not be given the details of your procedure findings, then the procedure report has been included in a sealed envelope for you to review at your convenience later.  YOU SHOULD EXPECT: Some feelings of bloating in the abdomen. Passage of more gas than usual.  Walking can help get rid of the air that was put into your GI tract during the procedure and reduce the bloating. If you had a lower endoscopy (such as a colonoscopy or flexible sigmoidoscopy) you may notice spotting of blood in your stool or on the toilet paper. If you underwent a bowel prep for your procedure, you may not have a normal bowel movement for a few days.  Please Note:  You might notice some irritation and congestion in your nose or some drainage.  This is from the oxygen used during your procedure.  There is no need for concern and it should clear up in a day or so.  SYMPTOMS TO REPORT IMMEDIATELY:   Following lower endoscopy (colonoscopy or flexible sigmoidoscopy):  Excessive amounts of blood in the stool  Significant tenderness or worsening of abdominal pains  Swelling of the abdomen that is new, acute  Fever of 100F or higher  For urgent or emergent issues, a gastroenterologist can be reached  at any hour by calling [phone]. Do not use MyChart messaging for urgent concerns.    DIET:  We do recommend a small meal at first, but then you may proceed to your regular diet.  Drink plenty of fluids but you should avoid alcoholic beverages for 24 hours.  ACTIVITY:  You should plan to take it easy for the rest of today and you should NOT DRIVE or use heavy machinery until tomorrow (because of the sedation medicines used during the test).    FOLLOW UP: Our staff will call the number listed on your records 48-72 hours following your procedure to check on you and address any questions or concerns that you may have regarding the information given to you following your procedure. If we do not reach you, we will leave a message.  We will attempt to reach you two times.  During this call, we will ask if you have developed any symptoms of COVID 19. If you develop any symptoms (ie: fever, flu-like symptoms, shortness of breath, cough etc.) before then, please call [phone].  If you test positive for Covid 19 in the 2 weeks post procedure, please call and report this information to [place].    If any biopsies were taken you will be contacted by phone or by letter within the next 1-3 weeks.  Please call us at [phone] if you have not heard about the biopsies in 3 weeks.    SIGNATURES/CONFIDENTIALITY: You and/or your care partner have signed paperwork which will be entered into your electronic medical record.  These signatures attest to the fact that that the information above on your After Visit Summary has been reviewed and is understood.  Full responsibility of the confidentiality of this discharge information lies with you and/or your care-partner.

## 2020-02-28 NOTE — Progress Notes (Signed)
Called to room to assist during endoscopic procedure.  Patient ID and intended procedure confirmed with present staff. Received instructions for my participation in the procedure from the performing physician.  

## 2020-03-03 ENCOUNTER — Telehealth: Payer: Self-pay | Admitting: *Deleted

## 2020-03-03 NOTE — Telephone Encounter (Signed)
  Follow up Call-  Call back number 02/28/2020  Post procedure Call Back phone  # [phone]  Permission to leave phone message Yes  Some recent data might be hidden     Patient questions:  Do you have a fever, pain , or abdominal swelling? No. Pain Score  0 *  Have you tolerated food without any problems? Yes.    Have you been able to return to your normal activities? Yes.    Do you have any questions about your discharge instructions: Diet   No. Medications  No. Follow up visit  No.  Do you have questions or concerns about your Care? No.  Actions: * If pain score is 4 or above: No action needed, pain <4.  1. Have you developed a fever since your procedure? NO  2.   Have you had an respiratory symptoms (SOB or cough) since your procedure? NO  3.   Have you tested positive for COVID 19 since your procedure NO  4.   Have you had any family members/close contacts diagnosed with the COVID 19 since your procedure?  NO   If yes to any of these questions please route to Joylene John, RN and Erenest Rasher, RN

## 2020-03-11 ENCOUNTER — Other Ambulatory Visit: Payer: Self-pay | Admitting: Gastroenterology

## 2020-03-11 DIAGNOSIS — K639 Disease of intestine, unspecified: Secondary | ICD-10-CM

## 2020-03-11 DIAGNOSIS — D123 Benign neoplasm of transverse colon: Secondary | ICD-10-CM

## 2020-03-24 ENCOUNTER — Ambulatory Visit
Admission: RE | Admit: 2020-03-24 | Discharge: 2020-03-24 | Disposition: A | Discharge status: Home / Self Care | Payer: Medicare Other | Source: Ambulatory Visit | Attending: Gastroenterology | Admitting: Gastroenterology

## 2020-03-24 DIAGNOSIS — D123 Benign neoplasm of transverse colon: Secondary | ICD-10-CM

## 2020-03-24 DIAGNOSIS — K639 Disease of intestine, unspecified: Secondary | ICD-10-CM

## 2020-03-24 DIAGNOSIS — K429 Umbilical hernia without obstruction or gangrene: Secondary | ICD-10-CM | POA: Diagnosis not present

## 2020-03-24 MED ORDER — IOPAMIDOL (ISOVUE-300) INJECTION 61%
100.0000 mL | Freq: Once | INTRAVENOUS | Status: AC | PRN
  Administered 2020-03-24: 100 mL via INTRAVENOUS

## 2020-03-25 ENCOUNTER — Telehealth: Payer: Self-pay

## 2020-03-25 NOTE — Telephone Encounter (Signed)
Referral faxed to [facility] - Drs Marcello Moores or [facility] for surgical resection consultation. Spoke with patient to make her aware that she will receive a call from [facility] regarding an appointment.  Pt has a follow up appointment with Dr. Bryan Lemma on /28/21. Patient verbalized understanding.

## 2020-03-27 ENCOUNTER — Other Ambulatory Visit: Payer: Self-pay

## 2020-03-27 DIAGNOSIS — N289 Disorder of kidney and ureter, unspecified: Secondary | ICD-10-CM

## 2020-03-27 DIAGNOSIS — K769 Liver disease, unspecified: Secondary | ICD-10-CM

## 2020-03-27 DIAGNOSIS — R9389 Abnormal findings on diagnostic imaging of other specified body structures: Secondary | ICD-10-CM

## 2020-04-03 ENCOUNTER — Telehealth: Payer: Self-pay | Admitting: Gastroenterology

## 2020-04-03 NOTE — Telephone Encounter (Signed)
Spoke to patient to inform her of her upcoming MRI 04/07/20 and instructions. She will have labs prior to MRI all questions answered. Patient voiced understanding.

## 2020-04-07 ENCOUNTER — Other Ambulatory Visit: Payer: Self-pay

## 2020-04-07 ENCOUNTER — Ambulatory Visit (HOSPITAL_COMMUNITY)
Admission: RE | Admit: 2020-04-07 | Discharge: 2020-04-07 | Disposition: A | Discharge status: Home / Self Care | Payer: Medicare Other | Source: Ambulatory Visit | Attending: Gastroenterology | Admitting: Gastroenterology

## 2020-04-07 ENCOUNTER — Other Ambulatory Visit (INDEPENDENT_AMBULATORY_CARE_PROVIDER_SITE_OTHER): Payer: Medicare Other

## 2020-04-07 DIAGNOSIS — R9389 Abnormal findings on diagnostic imaging of other specified body structures: Secondary | ICD-10-CM

## 2020-04-07 DIAGNOSIS — K769 Liver disease, unspecified: Secondary | ICD-10-CM | POA: Insufficient documentation

## 2020-04-07 DIAGNOSIS — N281 Cyst of kidney, acquired: Secondary | ICD-10-CM | POA: Diagnosis not present

## 2020-04-07 DIAGNOSIS — N289 Disorder of kidney and ureter, unspecified: Secondary | ICD-10-CM | POA: Diagnosis not present

## 2020-04-07 DIAGNOSIS — K7689 Other specified diseases of liver: Secondary | ICD-10-CM | POA: Diagnosis not present

## 2020-04-07 DIAGNOSIS — K449 Diaphragmatic hernia without obstruction or gangrene: Secondary | ICD-10-CM | POA: Diagnosis not present

## 2020-04-07 DIAGNOSIS — M5136 Other intervertebral disc degeneration, lumbar region: Secondary | ICD-10-CM | POA: Diagnosis not present

## 2020-04-07 LAB — CBC WITH DIFFERENTIAL/PLATELET
Basophils Absolute: 0 10*3/uL (ref 0.0–0.1)
Basophils Relative: 0.4 % (ref 0.0–3.0)
Eosinophils Absolute: 0.3 10*3/uL (ref 0.0–0.7)
Eosinophils Relative: 4.5 % (ref 0.0–5.0)
HCT: 36.5 % (ref 36.0–46.0)
Hemoglobin: 12.2 g/dL (ref 12.0–15.0)
Lymphocytes Relative: 31.2 % (ref 12.0–46.0)
Lymphs Abs: 1.7 10*3/uL (ref 0.7–4.0)
MCHC: 33.3 g/dL (ref 30.0–36.0)
MCV: 89.9 fl (ref 78.0–100.0)
Monocytes Absolute: 0.3 10*3/uL (ref 0.1–1.0)
Monocytes Relative: 5.2 % (ref 3.0–12.0)
Neutro Abs: 3.3 10*3/uL (ref 1.4–7.7)
Neutrophils Relative %: 58.7 % (ref 43.0–77.0)
Platelets: 184 10*3/uL (ref 150.0–400.0)
RBC: 4.06 Mil/uL (ref 3.87–5.11)
RDW: 13.4 % (ref 11.5–15.5)
WBC: 5.6 10*3/uL (ref 4.0–10.5)

## 2020-04-07 LAB — COMPREHENSIVE METABOLIC PANEL
ALT: 16 U/L (ref 0–35)
AST: 21 U/L (ref 0–37)
Albumin: 4.3 g/dL (ref 3.5–5.2)
Alkaline Phosphatase: 89 U/L (ref 39–117)
BUN: 15 mg/dL (ref 6–23)
CO2: 27 mEq/L (ref 19–32)
Calcium: 9.4 mg/dL (ref 8.4–10.5)
Chloride: 107 mEq/L (ref 96–112)
Creatinine, Ser: 0.73 mg/dL (ref 0.40–1.20)
GFR: 78.36 mL/min (ref >60.00)
Glucose, Bld: 88 mg/dL (ref 70–99)
Potassium: 4.2 mEq/L (ref 3.5–5.1)
Sodium: 141 mEq/L (ref 135–145)
Total Bilirubin: 1.4 mg/dL — ABNORMAL HIGH (ref 0.2–1.2)
Total Protein: 7.4 g/dL (ref 6.0–8.3)

## 2020-04-07 LAB — PROTIME-INR
INR: 1 ratio (ref 0.8–1.0)
Prothrombin Time: 11.4 s (ref 9.6–13.1)

## 2020-04-07 MED ORDER — GADOBUTROL 1 MMOL/ML IV SOLN
8.0000 mL | Freq: Once | INTRAVENOUS | Status: AC | PRN
  Administered 2020-04-07: 8 mL via INTRAVENOUS

## 2020-04-15 ENCOUNTER — Other Ambulatory Visit: Payer: Self-pay

## 2020-04-15 DIAGNOSIS — R17 Unspecified jaundice: Secondary | ICD-10-CM

## 2020-04-16 ENCOUNTER — Other Ambulatory Visit: Payer: Medicare Other

## 2020-04-16 DIAGNOSIS — R17 Unspecified jaundice: Secondary | ICD-10-CM | POA: Diagnosis not present

## 2020-04-17 LAB — BILIRUBIN, FRACTIONATED(TOT/DIR/INDIR)
Bilirubin, Direct: 0.3 mg/dL — ABNORMAL HIGH (ref 0.0–0.2)
Indirect Bilirubin: 1 mg/dL (calc) (ref 0.2–1.2)
Total Bilirubin: 1.3 mg/dL — ABNORMAL HIGH (ref 0.2–1.2)

## 2020-04-27 ENCOUNTER — Ambulatory Visit: Payer: Self-pay | Admitting: General Surgery

## 2020-04-27 DIAGNOSIS — K635 Polyp of colon: Secondary | ICD-10-CM | POA: Diagnosis not present

## 2020-04-27 NOTE — H&P (Signed)
History of Present Illness Kristina Ruff MD; 9/51/8841 10:47 AM) The patient is a 74 year old female who presents with a colonic polyp. 74 year old female who presents to the office for evaluation of colon polyp. She underwent screening colonoscopy and was noted to have an endoscopically unresectable splenic flexure polyp. The area was tattooed proximally and distally. EMR and surgical resection were discussed and the patient would prefer surgical resection and she is here today to discuss this. She denies any abdominal pain or changes in bowel habits. She was getting a workup for a slightly elevated bilirubin. No obvious pathology has been found in her liver. Patient is relatively healthy and leads an active lifestyle. Surgical history is significant for abdominal hysterectomy   Diagnostic Studies History Darden Palmer, [place]; 04/27/2020 10:19 AM) Colonoscopy within last year  Allergies Darden Palmer, RMA; 04/27/2020 10:21 AM) Aspirin *ANALGESICS - NonNarcotic* Allergies Reconciled  Medication History Darden Palmer, RMA; 04/27/2020 10:22 AM) Adair Patter (100-25MCG/INH Aero Pow Br Act, Inhalation) Active. Atorvastatin Calcium (10MG  Tablet, Oral) Active. Medications Reconciled  Pregnancy / Birth History Darden Palmer, [place]; 04/27/2020 10:19 AM) Age at menarche 61 years. Length (months) of breastfeeding 3-6 Maternal age 55-20 Regular periods  Other Problems Darden Palmer, [place]; 04/27/2020 10:19 AM) Asthma Back Pain     Review of Systems Claiborne Billings Dockery LPN; 6/60/6301 60:10 AM) General Not Present- Appetite Loss, Chills, Fatigue, Fever, Night Sweats, Weight Gain and Weight Loss. Skin Present- Dryness. Not Present- Change in Wart/Mole, Hives, Jaundice, New Lesions, Non-Healing Wounds, Rash and Ulcer. HEENT Not Present- Earache, Hearing Loss, Hoarseness, Nose Bleed, Oral Ulcers, Ringing in the Ears, Seasonal Allergies, Sinus Pain, Sore Throat, Visual Disturbances,  Wears glasses/contact lenses and Yellow Eyes. Respiratory Not Present- Bloody sputum, Chronic Cough, Difficulty Breathing, Snoring and Wheezing. Breast Not Present- Breast Mass, Breast Pain, Nipple Discharge and Skin Changes. Cardiovascular Not Present- Chest Pain, Difficulty Breathing Lying Down, Leg Cramps, Palpitations, Rapid Heart Rate, Shortness of Breath and Swelling of Extremities. Gastrointestinal Not Present- Abdominal Pain, Bloating, Bloody Stool, Change in Bowel Habits, Chronic diarrhea, Constipation, Difficulty Swallowing, Excessive gas, Gets full quickly at meals, Hemorrhoids, Indigestion, Nausea, Rectal Pain and Vomiting. Female Genitourinary Not Present- Frequency, Nocturia, Painful Urination, Pelvic Pain and Urgency. Musculoskeletal Not Present- Back Pain, Joint Pain, Joint Stiffness, Muscle Pain, Muscle Weakness and Swelling of Extremities. Neurological Not Present- Decreased Memory, Fainting, Headaches, Numbness, Seizures, Tingling, Tremor, Trouble walking and Weakness. Psychiatric Not Present- Anxiety, Bipolar, Change in Sleep Pattern, Depression, Fearful and Frequent crying. Endocrine Not Present- Cold Intolerance, Excessive Hunger, Hair Changes, Heat Intolerance, Hot flashes and New Diabetes. Hematology Not Present- Blood Thinners, Easy Bruising, Excessive bleeding, Gland problems, HIV and Persistent Infections.  Vitals Lattie Haw [place] RMA; 04/27/2020 10:23 AM) 04/27/2020 10:22 AM Weight: 186 lb Height: 60.5in Body Surface Area: 1.82 m Body Mass Index: 35.73 kg/m  Temp.: 3F  Pulse: 76 (Regular)  P.OX: 98% (Room air) BP: 120/74(Sitting, Left Arm, Standard)        Physical Exam Kristina Ruff MD; 9/32/3557 10:47 AM)  General Mental Status-Alert. General Appearance-Cooperative.  Abdomen Palpation/Percussion Palpation and Percussion of the abdomen reveal - Soft and Non Tender.    Assessment & Plan Kristina Ruff MD; 10/27/252 10:45 AM)  POLYP  OF SPLENIC FLEXURE OF COLON (K63.5) Impression: 74 year old female who presents to the office with an endoscopically unresectable polyp of the splenic flexure. The area has been tattooed proximally and distally. We have discussed this in detail. I have recommended that we proceed with a robotic  partial colectomy. We discussed that we will treat this as a colon cancer and remove lymph nodes as well in case the polyp does harbor some invasive features. We discussed removing part of the transverse colon and part of the descending colon and performing an intracorporeal anastomosis. The surgery and anatomy were described to the patient as well as the risks of surgery and the possible complications. These include: Bleeding, deep abdominal infections and possible wound complications such as hernia and infection, damage to adjacent structures, leak of surgical connections, which can lead to other surgeries and possibly an ostomy, possible need for other procedures, such as abscess drains in radiology, possible prolonged hospital stay, possible diarrhea from removal of part of the colon, possible constipation from narcotics, prolonged fatigue/weakness or appetite loss, possible early recurrence of of disease, possible complications of their medical problems such as heart disease or arrhythmias or lung problems, death (less than 1%). I believe the patient understands and wishes to proceed with the surgery.

## 2020-05-05 ENCOUNTER — Encounter: Payer: Self-pay | Admitting: Gastroenterology

## 2020-05-05 ENCOUNTER — Ambulatory Visit: Payer: Medicare Other | Admitting: Gastroenterology

## 2020-05-05 VITALS — BP 124/74 | HR 74 | Ht 62.0 in | Wt 188.0 lb

## 2020-05-05 DIAGNOSIS — K635 Polyp of colon: Secondary | ICD-10-CM

## 2020-05-05 DIAGNOSIS — D126 Benign neoplasm of colon, unspecified: Secondary | ICD-10-CM | POA: Diagnosis not present

## 2020-05-05 NOTE — Patient Instructions (Signed)
If you are age 72 or older, your body mass index should be between 23-30. Your Body mass index is 34.39 kg/m. If this is out of the aforementioned range listed, please consider follow up with your Primary Care Provider.  If you are age 71 or younger, your body mass index should be between 19-25. Your Body mass index is 34.39 kg/m. If this is out of the aformentioned range listed, please consider follow up with your Primary Care Provider.   Patient will call for a follow up appointment in the future.  It was a pleasure to see you today!  Vito Cirigliano, D.O.

## 2020-05-05 NOTE — Progress Notes (Signed)
P  Chief Complaint:    Procedure follow-up, follow-up on CT and MRI  GI History: 74 year old female with recently diagnosed large colon polyp, presents for routine follow-up.  Colonoscopy performed on 02/28/2020 for routine CRC screening, notable for 20 mm flat polyp at splenic flexure. Surface biopsies demonstrate tubulovillous adenoma.  Tattoo placed.  Referred to Colorectal Surgery.  -CT abdomen/pelvis (03/24/2020): Liver with mildly lobulated contours and lesions in right hepatic lobe measuring up to 1.6 cm (likely cysts).  Normal pancreas, spleen.  Low-attenuation areas in the right kidney up to 1.2 cm.  Small HH otherwise normal GI tract.  Umbilical hernia. -MRI abdomen (04/07/2020): Small HH, multiple benign-appearing hepatic cysts and benign-appearing right renal cysts -04/07/2020: T bili 1.4, otherwise normal CBC, CMP, INR -04/16/2020: T bili 1.3, direct 0.3, indirect 1.0, consistent with Gilbert's  Endoscopic History: -Colonoscopy (02/28/2020, Dr. Bryan Lemma): 20 mm flat polyp at splenic flexure-biopsied and tattooed (path: TVA), Diverticulosis -Colonoscopy (01/2010): Diverticulosis  HPI:     Patient is a 74 y.o. female presenting to the Gastroenterology Clinic for follow-up.  She met with Dr. Marcello Moores at [facility] last week.  She also discussed her case with her daughter (Pediatrician) and son-in-law (Infectious Disease) who are located in [place], and was put in touch with Dr. Janith Lima, an Advanced Endoscopist with [facility].  He was able to review the recent endoscopic images and feels the lesion is potentially endoscopically resectable, so she is in the process of scheduling appointment with him.  She otherwise feels well and otherwise without any acute issues today.   Review of systems:     No chest pain, no SOB, no fevers, no urinary sx   Past Medical History:  Diagnosis Date  . Arthritis   . Asthma   . High cholesterol     Patient's surgical history, family  medical history, social history, medications and allergies were all reviewed in Epic    Current Outpatient Medications  Medication Sig Dispense Refill  . atorvastatin (LIPITOR) 10 MG tablet Take 10 mg by mouth daily.    . Cholecalciferol (VITAMIN D3 PO) Take 1 capsule by mouth daily.    . clobetasol cream (TEMOVATE) 1.32 % Apply 1 application topically daily as needed (will just take as needed).    . Cyanocobalamin (VITAMIN B-12 PO) Take 1 tablet by mouth daily.    . fluticasone furoate-vilanterol (BREO ELLIPTA) 100-25 MCG/INH AEPB Inhale 1 puff into the lungs daily as needed (for shortness of breath or wheezing).    Marland Kitchen loratadine (CLARITIN) 10 MG tablet Take 10 mg by mouth as needed.      No current facility-administered medications for this visit.    Physical Exam:     BP 124/74   Pulse 74   Ht _0  (1.575 m)   Wt 188 lb (85.3 kg)   BMI 34.39 kg/m   GENERAL:  Pleasant female in NAD PSYCH: : Cooperative, normal affect NEURO: Alert and oriented x 3, no focal neurologic deficits   IMPRESSION and PLAN:    1) Tubulovillous adenoma Recent colonoscopy performed for routine CRC screening notable for 20 mm flat TVA at the splenic flexure.  Surface biopsies obtained as it was not endoscopically resectable at that time.  She has since been evaluated by Dr. Marcello Moores at [facility] and offered surgical resection.  Thankfully cross-sectional imaging without any other worrisome features.  Her case was also reviewed by Dr. Janith Lima at [facility], who feels the lesion could be endoscopically  resectable.  He does perform ESD, which is not available in [place].  I told the patient that I am more than happy to send any information for his review, and any chance at minimally invasive resection is certainly favorable.  Per patient request, I called her daughter, Dr. Torrie Mayers [phone]) to update her and discuss.  Can follow-up with me anytime.       Lavena Bullion ,DO,  FACG 05/05/2020, 9:47 AM

## 2020-05-25 DIAGNOSIS — D123 Benign neoplasm of transverse colon: Secondary | ICD-10-CM | POA: Diagnosis not present

## 2020-05-25 DIAGNOSIS — D120 Benign neoplasm of cecum: Secondary | ICD-10-CM | POA: Diagnosis not present

## 2020-05-25 DIAGNOSIS — D126 Benign neoplasm of colon, unspecified: Secondary | ICD-10-CM | POA: Diagnosis not present

## 2020-06-25 DIAGNOSIS — Z23 Encounter for immunization: Secondary | ICD-10-CM | POA: Diagnosis not present

## 2021-01-27 ENCOUNTER — Other Ambulatory Visit: Payer: Self-pay | Admitting: Internal Medicine

## 2021-01-27 DIAGNOSIS — Z1231 Encounter for screening mammogram for malignant neoplasm of breast: Secondary | ICD-10-CM

## 2021-02-04 ENCOUNTER — Ambulatory Visit
Admission: RE | Admit: 2021-02-04 | Discharge: 2021-02-04 | Disposition: A | Discharge status: Home / Self Care | Payer: Medicare Other | Source: Ambulatory Visit | Attending: Internal Medicine | Admitting: Internal Medicine

## 2021-02-04 ENCOUNTER — Other Ambulatory Visit: Payer: Self-pay

## 2021-02-04 ENCOUNTER — Inpatient Hospital Stay: Admission: RE | Admit: 2021-02-04 | Payer: Medicare Other | Source: Ambulatory Visit

## 2021-02-04 DIAGNOSIS — Z1231 Encounter for screening mammogram for malignant neoplasm of breast: Secondary | ICD-10-CM | POA: Diagnosis not present

## 2021-02-17 DIAGNOSIS — I700 Atherosclerosis of aorta: Secondary | ICD-10-CM | POA: Diagnosis not present

## 2021-02-17 DIAGNOSIS — J4540 Moderate persistent asthma, uncomplicated: Secondary | ICD-10-CM | POA: Diagnosis not present

## 2021-02-17 DIAGNOSIS — Z1212 Encounter for screening for malignant neoplasm of rectum: Secondary | ICD-10-CM | POA: Diagnosis not present

## 2021-02-17 DIAGNOSIS — R82998 Other abnormal findings in urine: Secondary | ICD-10-CM | POA: Diagnosis not present

## 2021-02-17 DIAGNOSIS — Z0000 Encounter for general adult medical examination without abnormal findings: Secondary | ICD-10-CM | POA: Diagnosis not present

## 2021-02-17 DIAGNOSIS — E7800 Pure hypercholesterolemia, unspecified: Secondary | ICD-10-CM | POA: Diagnosis not present

## 2021-03-25 DIAGNOSIS — L280 Lichen simplex chronicus: Secondary | ICD-10-CM | POA: Diagnosis not present

## 2021-03-25 DIAGNOSIS — L281 Prurigo nodularis: Secondary | ICD-10-CM | POA: Diagnosis not present

## 2021-05-17 DIAGNOSIS — Z1211 Encounter for screening for malignant neoplasm of colon: Secondary | ICD-10-CM | POA: Diagnosis not present

## 2021-05-17 DIAGNOSIS — Z9889 Other specified postprocedural states: Secondary | ICD-10-CM | POA: Diagnosis not present

## 2021-05-17 DIAGNOSIS — Z86010 Personal history of colonic polyps: Secondary | ICD-10-CM | POA: Diagnosis not present

## 2021-09-28 DIAGNOSIS — H0102B Squamous blepharitis left eye, upper and lower eyelids: Secondary | ICD-10-CM | POA: Diagnosis not present

## 2021-09-28 DIAGNOSIS — H5203 Hypermetropia, bilateral: Secondary | ICD-10-CM | POA: Diagnosis not present

## 2021-09-28 DIAGNOSIS — H40053 Ocular hypertension, bilateral: Secondary | ICD-10-CM | POA: Diagnosis not present

## 2021-09-28 DIAGNOSIS — H0102A Squamous blepharitis right eye, upper and lower eyelids: Secondary | ICD-10-CM | POA: Diagnosis not present

## 2021-09-30 DIAGNOSIS — H524 Presbyopia: Secondary | ICD-10-CM | POA: Diagnosis not present

## 2022-02-21 DIAGNOSIS — E559 Vitamin D deficiency, unspecified: Secondary | ICD-10-CM | POA: Diagnosis not present

## 2022-02-21 DIAGNOSIS — R7989 Other specified abnormal findings of blood chemistry: Secondary | ICD-10-CM | POA: Diagnosis not present

## 2022-02-21 DIAGNOSIS — E7800 Pure hypercholesterolemia, unspecified: Secondary | ICD-10-CM | POA: Diagnosis not present

## 2022-02-21 DIAGNOSIS — Z0000 Encounter for general adult medical examination without abnormal findings: Secondary | ICD-10-CM | POA: Diagnosis not present

## 2022-02-24 DIAGNOSIS — J4540 Moderate persistent asthma, uncomplicated: Secondary | ICD-10-CM | POA: Diagnosis not present

## 2022-02-24 DIAGNOSIS — E7800 Pure hypercholesterolemia, unspecified: Secondary | ICD-10-CM | POA: Diagnosis not present

## 2022-02-24 DIAGNOSIS — Z0000 Encounter for general adult medical examination without abnormal findings: Secondary | ICD-10-CM | POA: Diagnosis not present

## 2022-02-24 DIAGNOSIS — R82998 Other abnormal findings in urine: Secondary | ICD-10-CM | POA: Diagnosis not present

## 2022-02-24 DIAGNOSIS — I700 Atherosclerosis of aorta: Secondary | ICD-10-CM | POA: Diagnosis not present

## 2022-03-27 IMAGING — CT CT ABD-PELV W/ CM
2 of 5 series · 15 of 46 positions shown, 17 images · IV contrast (iopamidol)
Comparison: None

CLINICAL DATA: Colon mass seen on the colonoscopy performed on
02/28/2020

EXAM:
CT ABDOMEN AND PELVIS WITH CONTRAST
TECHNIQUE: Multidetector CT imaging of the abdomen and pelvis was performed
using the standard protocol following bolus administration of
intravenous contrast.
Creatinine was obtained on site at [HOSPITAL] at [REDACTED].
Results: Creatinine 0.7 mg/dL.
CONTRAST:  100mL [id number] IOPAMIDOL ([id number]) INJECTION 61%

[Series 2: abd pelvis 5.00 br40 s3 axial · axial · 0.89mm/px · z∈[+1270,+1650]mm · 12 of 86 slices shown, 14 images]
[im 5/86  soft-tissue]
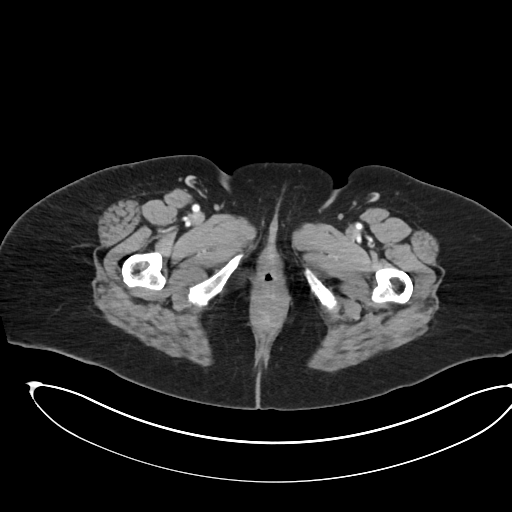
[im 5/86  bone]
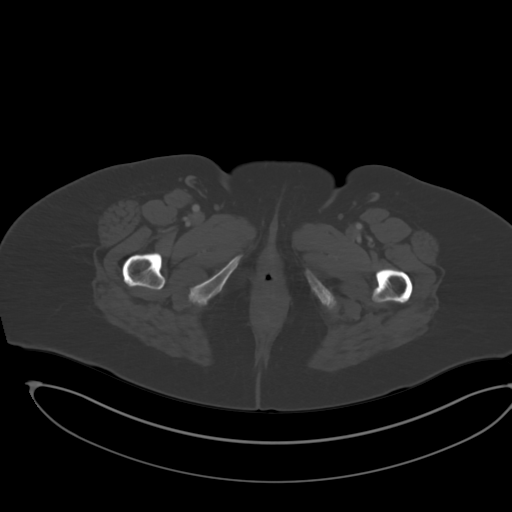
[im 14/86  soft-tissue]
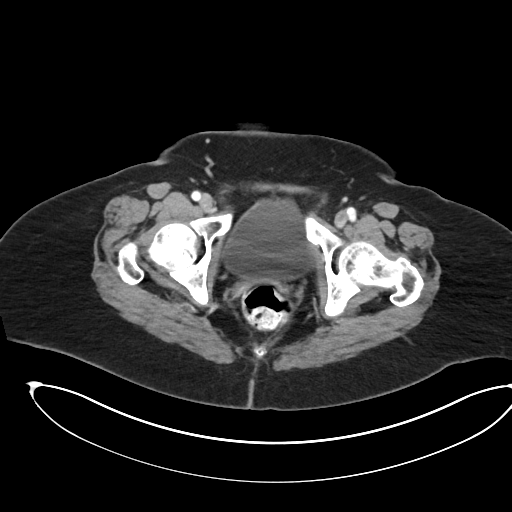
[im 18/86  soft-tissue]
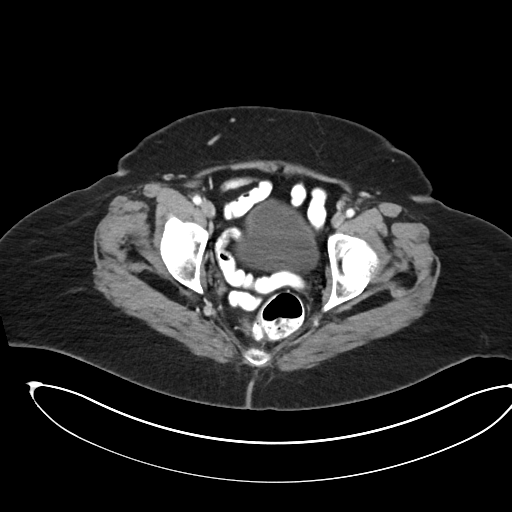
[im 27/86  soft-tissue]
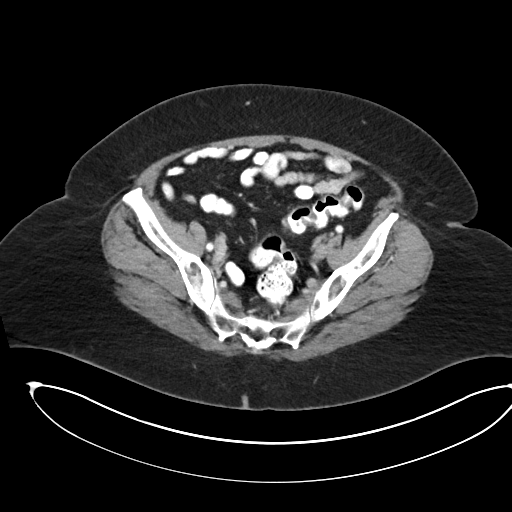
[im 32/86  soft-tissue]
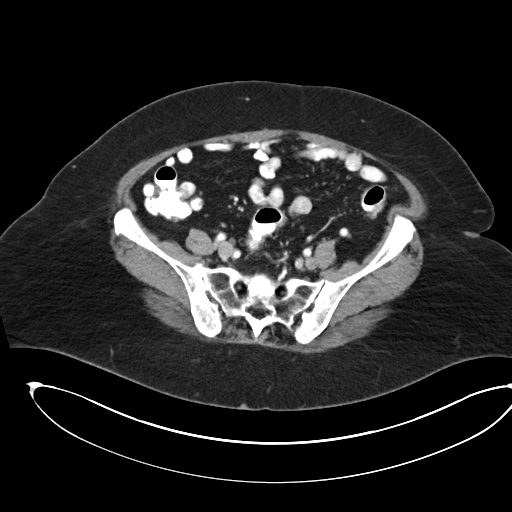
[im 41/86  soft-tissue]
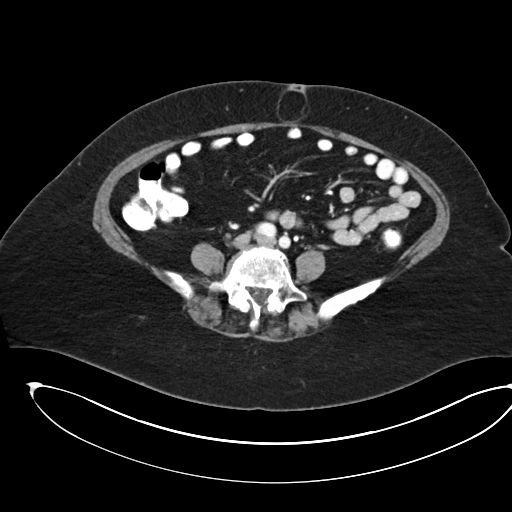
[im 45/86  soft-tissue]
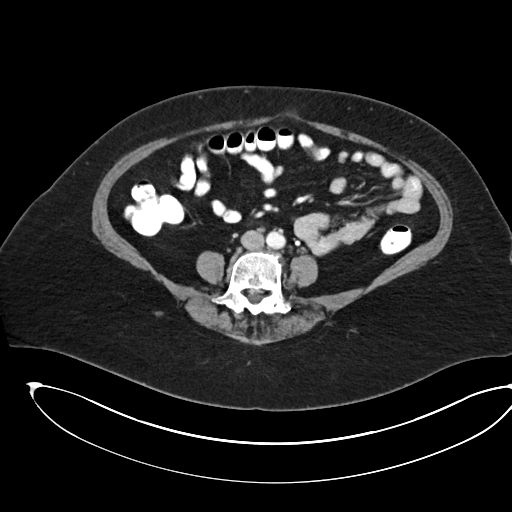
[im 54/86  soft-tissue]
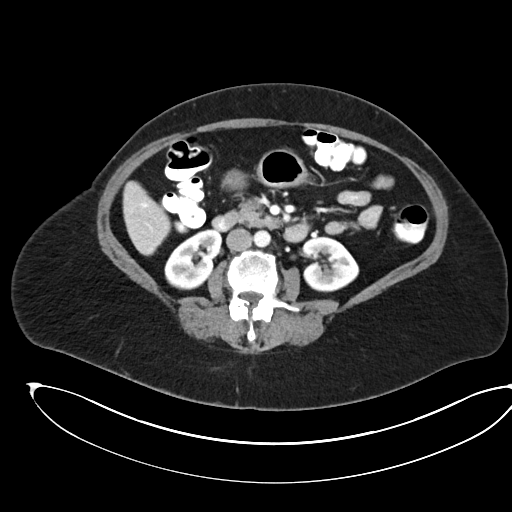
[im 59/86  soft-tissue]
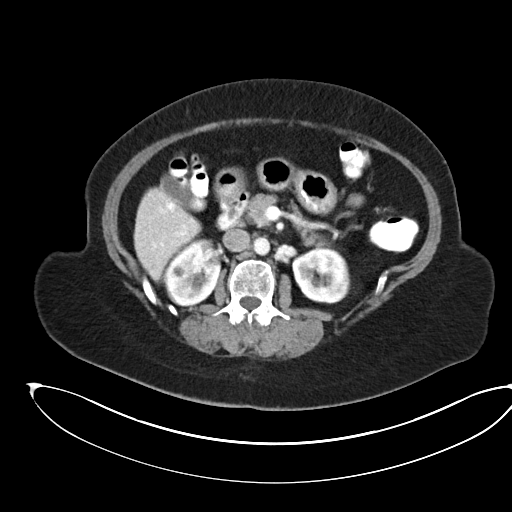
[im 59/86  bone]
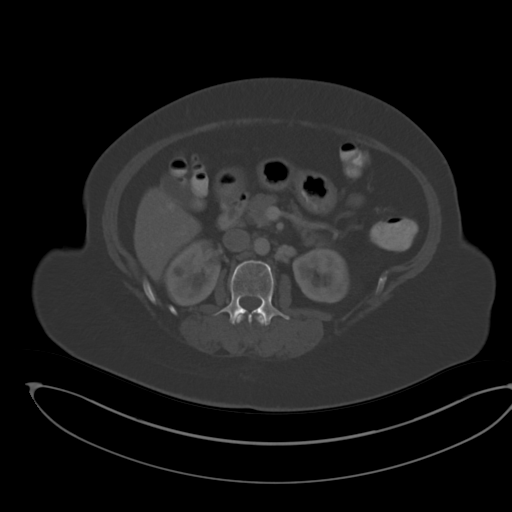
[im 68/86  soft-tissue]
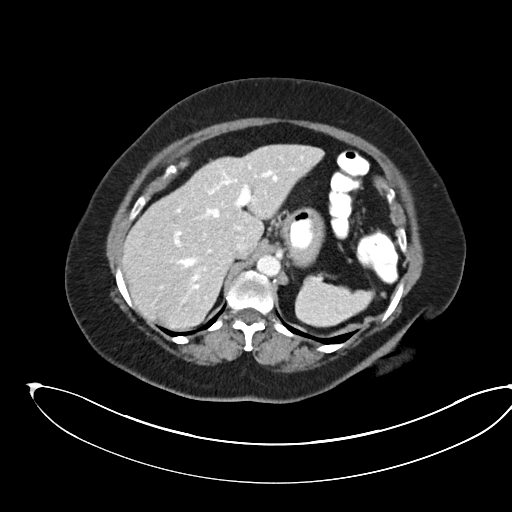
[im 72/86  soft-tissue]
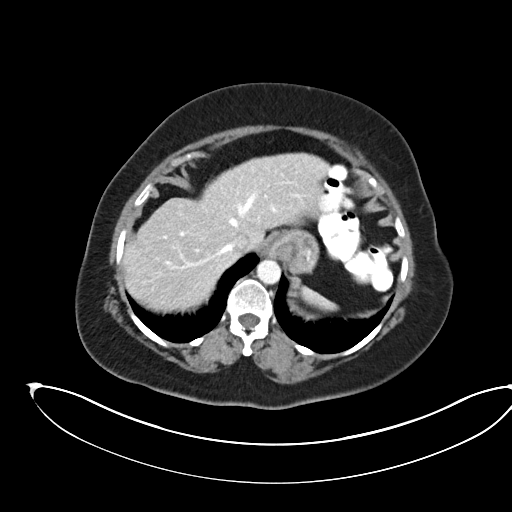
[im 81/86  soft-tissue]
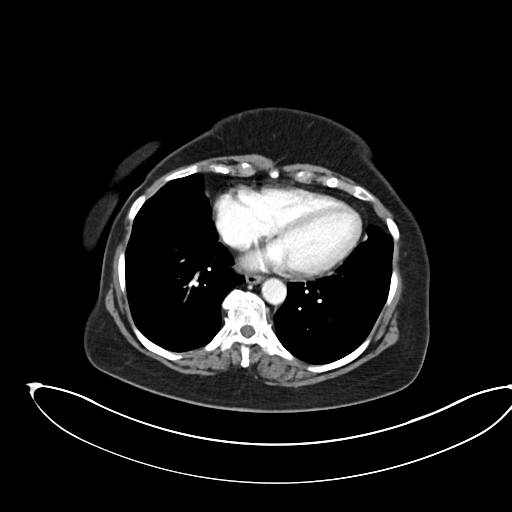

[Series 6: abd pelvis 2.00 br40 s3 cor · coronal · 0.85mm/px · 3 of 151 slices shown]
[im 51/151  soft-tissue]
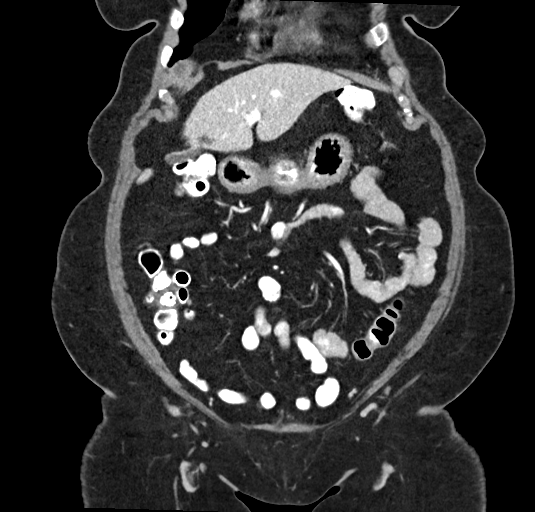
[im 67/151  soft-tissue]
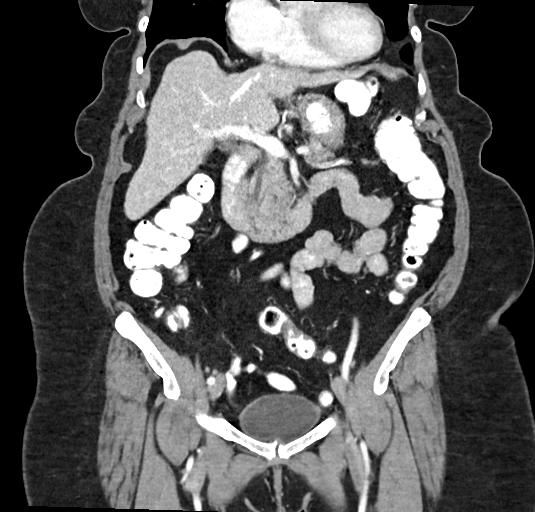
[im 84/151  soft-tissue]
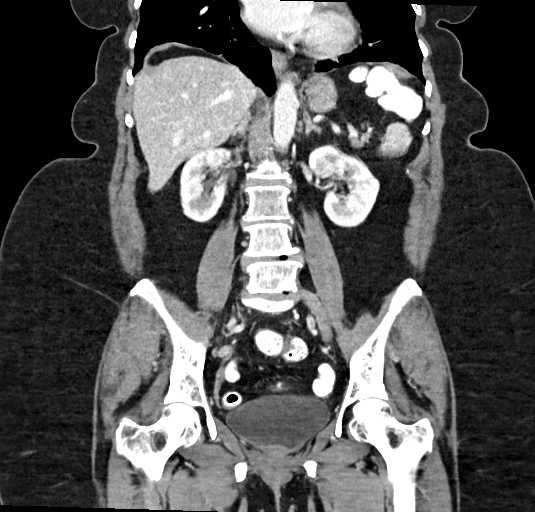

[15 of 46 positions shown; findings below may reference images not displayed]

FINDINGS: Lower chest: No effusion or consolidation.

Hepatobiliary: Liver with mildly lobulated contours. Lesions in the
RIGHT hepatic lobe largest measuring 1.6 cm. This lesion displays
low attenuation approximating water density as does the next largest
lesion at 1.6 x 1.3 cm. A lesion in the dome of the RIGHT hemi liver
with density values near 30 Hounsfield units not clearly water
density (image 14, series 2) small low-density focus in the RIGHT
hepatic lobe as well measuring 8 mm too small for definitive
characterization.

Pancreas: No focal pancreatic lesion, ductal dilation or
inflammation.

Spleen: Spleen normal in size and contour.

Adrenals/Urinary Tract: Adrenal glands are normal.

Low-attenuation areas in the RIGHT kidney, lesion in the anterior
interpolar RIGHT kidney measuring approximately 12 mm at 38
Hounsfield units. Similar appearance of a smaller lesion also in the
interpolar RIGHT kidney measuring 1 cm. Pseudo enhancement is
possible as these are surrounded by enhancing parenchyma. Small
hypoenhancing solid lesions are not excluded. No hydronephrosis.
Symmetric renal enhancement otherwise.

Stomach/Bowel: Small hiatal hernia. No acute bowel process. No
discrete colonic mass is visualized on CT.

Small bowel is normal in course and caliber. No perienteric
stranding.

Vascular/Lymphatic: No aneurysmal dilation of the abdominal aorta.
Scattered atherosclerotic changes in the abdominal aorta, minimal.
No adenopathy with small celiac node measuring 8 mm as the largest
node in the upper abdomen. No retroperitoneal adenopathy.

No pelvic adenopathy.

Reproductive: Post hysterectomy. LEFT ovary and RIGHT ovary appear
to remain in situ. No pelvic mass. No ascites or adenopathy the
pelvis.

Other: Fat containing umbilical hernia is moderate-sized.

Musculoskeletal: Spinal degenerative changes. No acute or
destructive bone finding.
IMPRESSION: 1. No discrete colonic mass is visualized on CT.
2. Low-attenuation lesions in the RIGHT kidney greater than 30
Hounsfield units. Small solid renal neoplasm versus hemorrhagic
cysts are considered. MRI may be helpful for further assessment.
3. Low-density lesions in the liver likely small cysts however given
the history of potential colonic neoplasm the smallest of these are
not definitively characterized and could be further evaluated with
abdominal MRI, this could serve to fully characterized both renal
and hepatic lesions.
4. Fat containing umbilical hernia is moderate-sized.
5. Aortic atherosclerosis.

Aortic Atherosclerosis ([id number].A).

## 2022-05-30 ENCOUNTER — Ambulatory Visit (INDEPENDENT_AMBULATORY_CARE_PROVIDER_SITE_OTHER): Payer: Medicare Other

## 2022-05-30 ENCOUNTER — Ambulatory Visit: Payer: Medicare Other | Admitting: Orthopaedic Surgery

## 2022-05-30 DIAGNOSIS — M25562 Pain in left knee: Secondary | ICD-10-CM

## 2022-05-30 DIAGNOSIS — G8929 Other chronic pain: Secondary | ICD-10-CM

## 2022-05-30 MED ORDER — MUPIROCIN CALCIUM 2 % EX CREA: 1.0000 | TOPICAL_CREAM | Freq: Two times a day (BID) | CUTANEOUS | 0 refills | Status: AC

## 2022-05-30 MED ORDER — DOXYCYCLINE HYCLATE 100 MG PO TABS: 100.0000 mg | ORAL_TABLET | Freq: Two times a day (BID) | ORAL | 1 refills | Status: AC

## 2022-05-30 NOTE — Progress Notes (Signed)
Kristina Burns comes in today to talk about her left knee and knee replacement surgery for that left knee.  We actually replaced her right knee several years ago.  That knee has done very well for her.  She is incredibly active and has debilitating well-documented arthritis of her left knee.  It now has gotten worse hurting her on a daily basis.  She is walk with a limp.  She has tried and failed all forms conservative treatment for her left knee.  At this point it is detrimentally finding her mobility, her quality of life and her actives daily living.  She has had no acute change in her medical status otherwise.  I was able to review all of her notes within epic.  Examination of her right knee shows a well-healed surgical incision.  That knee is nice and straight with excellent range of motion.  Her left knee has medial lateral joint line tenderness.  There is varus malalignment that is correctable.  She has significant crepitation throughout the arc of motion of her knee.  X-rays of her left knee today show significant tricompartment arthritis with osteophytes in all 3 compartments.  There is varus malalignment and significant narrowing medial and patellofemoral joint.  Her skin does have chronic changes near where the knee incision be made.  These have been there for a long period of time and she does see a dermatologist.  Some of this gets and flared up when she places a cream on it and it itches quite a bit.  I will have her try Bactroban ointment as well as put her on doxycycline.  I am fine with proceeding with surgery in early December for a left knee replacement in spite of these chronic wounds.  She is not a diabetic and not immunocompromise.  We will work on getting surgery scheduled and be in touch for a left knee replacement.

## 2022-05-31 DIAGNOSIS — Z23 Encounter for immunization: Secondary | ICD-10-CM | POA: Diagnosis not present

## 2022-06-21 ENCOUNTER — Other Ambulatory Visit: Payer: Self-pay

## 2022-06-29 ENCOUNTER — Other Ambulatory Visit: Payer: Self-pay | Admitting: Physician Assistant

## 2022-06-29 DIAGNOSIS — M1712 Unilateral primary osteoarthritis, left knee: Secondary | ICD-10-CM

## 2022-07-01 NOTE — Pre-Procedure Instructions (Signed)
Surgical Instructions    Your procedure is scheduled on Thursday, December 7th.  Report to [facility] Main Entrance "A" at 5:30 A.M., then check in with the Admitting office.  Call this number if you have problems the morning of surgery:  [phone]   If you have any questions prior to your surgery date call [phone]: Open Monday-Friday 8am-4pm    Remember:  Do not eat after midnight the night before your surgery  You may drink clear liquids until 4:30 a.m. the morning of your surgery.   Clear liquids allowed are: Water, Non-Citrus Juices (without pulp), Carbonated Beverages, Clear Tea, Black Coffee Only (NO MILK, CREAM OR POWDERED CREAMER of any kind), and Gatorade.   Enhanced Recovery after Surgery for Orthopedics Enhanced Recovery after Surgery is a protocol used to improve the stress on your body and your recovery after surgery.  Patient Instructions  The day of surgery (if you do NOT have diabetes):  Drink ONE (1) Pre-Surgery Clear Ensure by 4:30 am the morning of surgery   This drink was given to you during your hospital  pre-op appointment visit. Nothing else to drink after completing the  Pre-Surgery Clear Ensure.         If you have questions, please contact your surgeon's office.     Take these medicines the morning of surgery with A SIP OF WATER  atorvastatin (LIPITOR)  doxycycline (VIBRA-TABS)   7 days prior to surgery, STOP taking any Aspirin (unless otherwise instructed by your surgeon) Aleve, Naproxen, Ibuprofen, Motrin, Advil, Goody's, BC's, all herbal medications, fish oil, and all vitamins.                     Do NOT Smoke (Tobacco/Vaping) for 24 hours prior to your procedure.  If you use a CPAP at night, you may bring your mask/headgear for your overnight stay.   Contacts, glasses, piercing's, hearing aid's, dentures or partials may not be worn into surgery, please bring cases for these belongings.    For patients admitted to the hospital,  discharge time will be determined by your treatment team.   Patients discharged the day of surgery will not be allowed to drive home, and someone needs to stay with them for 24 hours.  SURGICAL WAITING ROOM VISITATION Patients having surgery or a procedure may have no more than 2 support people in the waiting area - these visitors may rotate.   Children under the age of 3 must have an adult with them who is not the patient. If the patient needs to stay at the hospital during part of their recovery, the visitor guidelines for inpatient rooms apply. Pre-op nurse will coordinate an appropriate time for 1 support person to accompany patient in pre-op.  This support person may not rotate.   Please refer to the [facility] website for the visitor guidelines for Inpatients (after your surgery is over and you are in a regular room).    Special instructions:   [place]- Preparing For Surgery  Before surgery, you can play an important role. Because skin is not sterile, your skin needs to be as free of germs as possible. You can reduce the number of germs on your skin by washing with CHG (chlorahexidine gluconate) Soap before surgery.  CHG is an antiseptic cleaner which kills germs and bonds with the skin to continue killing germs even after washing.    Oral Hygiene is also important to reduce your risk of infection.  Remember - BRUSH YOUR  TEETH THE MORNING OF SURGERY WITH YOUR REGULAR TOOTHPASTE  Please do not use if you have an allergy to CHG or antibacterial soaps. If your skin becomes reddened/irritated stop using the CHG.  Do not shave (including legs and underarms) for at least 48 hours prior to first CHG shower. It is OK to shave your face.  Please follow these instructions carefully.   Shower the NIGHT BEFORE SURGERY and the MORNING OF SURGERY  If you chose to wash your hair, wash your hair first as usual with your normal shampoo.  After you shampoo, rinse your hair and body thoroughly  to remove the shampoo.  Use CHG Soap as you would any other liquid soap. You can apply CHG directly to the skin and wash gently with a scrungie or a clean washcloth.   Apply the CHG Soap to your body ONLY FROM THE NECK DOWN.  Do not use on open wounds or open sores. Avoid contact with your eyes, ears, mouth and genitals (private parts). Wash Face and genitals (private parts)  with your normal soap.   Wash thoroughly, paying special attention to the area where your surgery will be performed.  Thoroughly rinse your body with warm water from the neck down.  DO NOT shower/wash with your normal soap after using and rinsing off the CHG Soap.  Pat yourself dry with a CLEAN TOWEL.  Wear CLEAN PAJAMAS to bed the night before surgery  Place CLEAN SHEETS on your bed the night before your surgery  DO NOT SLEEP WITH PETS.   Day of Surgery: Take a shower with CHG soap. Do not wear jewelry or makeup Do not wear lotions, powders, perfumes, or deodorant. Do not shave 48 hours prior to surgery.  Do not bring valuables to the hospital.  [facility] is not responsible for any belongings or valuables. Do not wear nail polish, gel polish, artificial nails, or any other type of covering on natural nails (fingers and toes) If you have artificial nails or gel coating that need to be removed by a nail salon, please have this removed prior to surgery. Artificial nails or gel coating may interfere with anesthesia's ability to adequately monitor your vital signs. Wear Clean/Comfortable clothing the morning of surgery Remember to brush your teeth WITH YOUR REGULAR TOOTHPASTE.   Please read over the following fact sheets that you were given.    If you received a COVID test during your pre-op visit  it is requested that you wear a mask when out in public, stay away from anyone that may not be feeling well and notify your surgeon if you develop symptoms. If you have been in contact with anyone that has tested  positive in the last 10 days please notify you surgeon.

## 2022-07-04 ENCOUNTER — Other Ambulatory Visit: Payer: Self-pay

## 2022-07-04 ENCOUNTER — Encounter (HOSPITAL_COMMUNITY): Payer: Self-pay

## 2022-07-04 ENCOUNTER — Encounter (HOSPITAL_COMMUNITY)
Admission: RE | Admit: 2022-07-04 | Discharge: 2022-07-04 | Disposition: A | Discharge status: Home / Self Care | Payer: Medicare Other | Source: Ambulatory Visit | Attending: Orthopaedic Surgery | Admitting: Orthopaedic Surgery

## 2022-07-04 VITALS — BP 129/78 | Temp 97.3°F | Resp 18 | Ht 63.0 in | Wt 187.4 lb

## 2022-07-04 DIAGNOSIS — Z01812 Encounter for preprocedural laboratory examination: Secondary | ICD-10-CM | POA: Insufficient documentation

## 2022-07-04 DIAGNOSIS — Z01818 Encounter for other preprocedural examination: Secondary | ICD-10-CM

## 2022-07-04 DIAGNOSIS — M1712 Unilateral primary osteoarthritis, left knee: Secondary | ICD-10-CM | POA: Diagnosis not present

## 2022-07-04 LAB — SURGICAL PCR SCREEN
MRSA, PCR: NEGATIVE
Staphylococcus aureus: NEGATIVE

## 2022-07-04 LAB — CBC
HCT: 34.4 % — ABNORMAL LOW (ref 36.0–46.0)
Hemoglobin: 11.3 g/dL — ABNORMAL LOW (ref 12.0–15.0)
MCH: 30.1 pg (ref 26.0–34.0)
MCHC: 32.8 g/dL (ref 30.0–36.0)
MCV: 91.5 fL (ref 80.0–100.0)
Platelets: 198 10*3/uL (ref 150–400)
RBC: 3.76 MIL/uL — ABNORMAL LOW (ref 3.87–5.11)
RDW: 13 % (ref 11.5–15.5)
WBC: 7.2 10*3/uL (ref 4.0–10.5)
nRBC: 0 % (ref 0.0–0.2)

## 2022-07-04 LAB — COMPREHENSIVE METABOLIC PANEL
ALT: 14 U/L (ref 0–44)
AST: 18 U/L (ref 15–41)
Albumin: 3.9 g/dL (ref 3.5–5.0)
Alkaline Phosphatase: 74 U/L (ref 38–126)
Anion gap: 9 (ref 5–15)
BUN: 16 mg/dL (ref 8–23)
CO2: 26 mmol/L (ref 22–32)
Calcium: 9.6 mg/dL (ref 8.9–10.3)
Chloride: 102 mmol/L (ref 98–111)
Creatinine, Ser: 0.72 mg/dL (ref 0.44–1.00)
GFR, Estimated: >60 mL/min (ref >60)
Glucose, Bld: 115 mg/dL — ABNORMAL HIGH (ref 70–99)
Potassium: 4.5 mmol/L (ref 3.5–5.1)
Sodium: 137 mmol/L (ref 135–145)
Total Bilirubin: 1.2 mg/dL (ref 0.3–1.2)
Total Protein: 7.2 g/dL (ref 6.5–8.1)

## 2022-07-04 NOTE — Progress Notes (Signed)
PCP - Dr. Domenick Gong Cardiologist - denies  PPM/ICD - n/a  Chest x-ray - n/a EKG - n/a Stress Test - denies ECHO - denies Cardiac Cath - denies  Sleep Study - denies CPAP - denies  Last dose of GLP1 agonist-  n/a GLP1 instructions: n/a  Blood Thinner Instructions: n/a Aspirin Instructions: n/a  ERAS Protcol -Clear liquids until 0430 DOS PRE-SURGERY Ensure or G2- Ensure provided  COVID TEST- n/a  Anesthesia review: No  Patient denies shortness of breath, fever, cough and chest pain at PAT appointment   All instructions explained to the patient, with a verbal understanding of the material. Patient agrees to go over the instructions while at home for a better understanding. Patient also instructed to self quarantine after being tested for COVID-19. The opportunity to ask questions was provided.

## 2022-07-13 NOTE — Anesthesia Preprocedure Evaluation (Signed)
Anesthesia Evaluation  Patient identified by MRN, date of birth, ID band Patient awake    Reviewed: Allergy & Precautions, NPO status , Patient's Chart, lab work & pertinent test results  History of Anesthesia Complications Negative for: history of anesthetic complications  Airway Mallampati: II  TM Distance: >3 FB Neck ROM: Full    Dental no notable dental hx.    Pulmonary asthma    Pulmonary exam normal        Cardiovascular negative cardio ROS Normal cardiovascular exam     Neuro/Psych negative neurological ROS  negative psych ROS   GI/Hepatic negative GI ROS, Neg liver ROS,,,  Endo/Other  negative endocrine ROS    Renal/GU negative Renal ROS  negative genitourinary   Musculoskeletal  (+) Arthritis , Osteoarthritis,    Abdominal   Peds  Hematology  (+) Blood dyscrasia (Hgb 11.3, Plt 198k), anemia   Anesthesia Other Findings Day of surgery medications reviewed with patient.  Reproductive/Obstetrics negative OB ROS                              Anesthesia Physical Anesthesia Plan  ASA: 2  Anesthesia Plan: Spinal   Post-op Pain Management: Tylenol PO (pre-op)* and Regional block*   Induction:   PONV Risk Score and Plan: 3 and Treatment may vary due to age or medical condition, Ondansetron, Propofol infusion and Dexamethasone  Airway Management Planned: Natural Airway and Simple Face Mask  Additional Equipment: None  Intra-op Plan:   Post-operative Plan:   Informed Consent: I have reviewed the patients History and Physical, chart, labs and discussed the procedure including the risks, benefits and alternatives for the proposed anesthesia with the patient or authorized representative who has indicated his/her understanding and acceptance.       Plan Discussed with: CRNA  Anesthesia Plan Comments:         Anesthesia Quick Evaluation

## 2022-07-14 ENCOUNTER — Ambulatory Visit (HOSPITAL_BASED_OUTPATIENT_CLINIC_OR_DEPARTMENT_OTHER): Payer: Medicare Other | Admitting: Anesthesiology

## 2022-07-14 ENCOUNTER — Encounter (HOSPITAL_COMMUNITY)
Admission: RE | Disposition: A | Discharge status: Home Health | Payer: Self-pay | Source: Home / Self Care | Attending: Orthopaedic Surgery

## 2022-07-14 ENCOUNTER — Observation Stay (HOSPITAL_COMMUNITY): Payer: Medicare Other

## 2022-07-14 ENCOUNTER — Ambulatory Visit (HOSPITAL_COMMUNITY): Payer: Medicare Other | Admitting: Anesthesiology

## 2022-07-14 ENCOUNTER — Other Ambulatory Visit: Payer: Self-pay

## 2022-07-14 ENCOUNTER — Inpatient Hospital Stay (HOSPITAL_COMMUNITY)
Admission: RE | Admit: 2022-07-14 | Discharge: 2022-07-16 | DRG: 470 | Disposition: A | Discharge status: Home Health | Payer: Medicare Other | Attending: Orthopaedic Surgery | Admitting: Orthopaedic Surgery

## 2022-07-14 DIAGNOSIS — J45909 Unspecified asthma, uncomplicated: Secondary | ICD-10-CM | POA: Diagnosis not present

## 2022-07-14 DIAGNOSIS — Z96652 Presence of left artificial knee joint: Secondary | ICD-10-CM | POA: Diagnosis not present

## 2022-07-14 DIAGNOSIS — Z886 Allergy status to analgesic agent status: Secondary | ICD-10-CM

## 2022-07-14 DIAGNOSIS — M25462 Effusion, left knee: Secondary | ICD-10-CM | POA: Diagnosis not present

## 2022-07-14 DIAGNOSIS — Z471 Aftercare following joint replacement surgery: Secondary | ICD-10-CM | POA: Diagnosis not present

## 2022-07-14 DIAGNOSIS — Z96651 Presence of right artificial knee joint: Secondary | ICD-10-CM | POA: Diagnosis present

## 2022-07-14 DIAGNOSIS — M1712 Unilateral primary osteoarthritis, left knee: Secondary | ICD-10-CM | POA: Diagnosis not present

## 2022-07-14 DIAGNOSIS — M179 Osteoarthritis of knee, unspecified: Secondary | ICD-10-CM | POA: Diagnosis present

## 2022-07-14 DIAGNOSIS — E7800 Pure hypercholesterolemia, unspecified: Secondary | ICD-10-CM | POA: Diagnosis present

## 2022-07-14 DIAGNOSIS — Z90710 Acquired absence of both cervix and uterus: Secondary | ICD-10-CM | POA: Diagnosis not present

## 2022-07-14 DIAGNOSIS — M25562 Pain in left knee: Secondary | ICD-10-CM | POA: Diagnosis present

## 2022-07-14 DIAGNOSIS — D62 Acute posthemorrhagic anemia: Secondary | ICD-10-CM | POA: Diagnosis not present

## 2022-07-14 DIAGNOSIS — G8918 Other acute postprocedural pain: Secondary | ICD-10-CM | POA: Diagnosis not present

## 2022-07-14 HISTORY — PX: TOTAL KNEE ARTHROPLASTY: SHX125

## 2022-07-14 LAB — ABO/RH: ABO/RH(D): O POS

## 2022-07-14 SURGERY — ARTHROPLASTY, KNEE, TOTAL
Anesthesia: Spinal | Site: Knee | Laterality: Left

## 2022-07-14 MED ORDER — ACETAMINOPHEN 500 MG PO TABS
ORAL_TABLET | ORAL | Status: AC
  Filled 2022-07-14: qty 2

## 2022-07-14 MED ORDER — ONDANSETRON HCL 4 MG/2ML IJ SOLN
INTRAMUSCULAR | Status: DC | PRN
  Administered 2022-07-14: 4 mg via INTRAVENOUS

## 2022-07-14 MED ORDER — OXYCODONE HCL 5 MG PO TABS
5.0000 mg | ORAL_TABLET | ORAL | Status: DC | PRN
  Administered 2022-07-14 – 2022-07-16 (×8): 10 mg via ORAL
  Filled 2022-07-14 (×8): qty 2

## 2022-07-14 MED ORDER — CHLORHEXIDINE GLUCONATE 0.12 % MT SOLN
OROMUCOSAL | Status: AC
  Filled 2022-07-14: qty 15

## 2022-07-14 MED ORDER — ONDANSETRON HCL 4 MG/2ML IJ SOLN
INTRAMUSCULAR | Status: AC
  Filled 2022-07-14: qty 2

## 2022-07-14 MED ORDER — PHENOL 1.4 % MT LIQD: 1.0000 | OROMUCOSAL | Status: DC | PRN

## 2022-07-14 MED ORDER — PROPOFOL 10 MG/ML IV BOLUS
INTRAVENOUS | Status: DC | PRN
  Administered 2022-07-14: 20 mg via INTRAVENOUS

## 2022-07-14 MED ORDER — HYDROMORPHONE HCL 2 MG PO TABS: 2.0000 mg | ORAL_TABLET | ORAL | Status: DC | PRN

## 2022-07-14 MED ORDER — CHLORHEXIDINE GLUCONATE 0.12 % MT SOLN
15.0000 mL | Freq: Once | OROMUCOSAL | Status: AC
  Administered 2022-07-14: 15 mL via OROMUCOSAL

## 2022-07-14 MED ORDER — GLYCOPYRROLATE 0.2 MG/ML IJ SOLN
INTRAMUSCULAR | Status: AC
  Filled 2022-07-14: qty 1

## 2022-07-14 MED ORDER — ORAL CARE MOUTH RINSE: 15.0000 mL | Freq: Once | OROMUCOSAL | Status: AC

## 2022-07-14 MED ORDER — CEFAZOLIN SODIUM-DEXTROSE 1-4 GM/50ML-% IV SOLN
1.0000 g | Freq: Four times a day (QID) | INTRAVENOUS | Status: AC
  Administered 2022-07-14 (×2): 1 g via INTRAVENOUS
  Filled 2022-07-14 (×2): qty 50

## 2022-07-14 MED ORDER — MENTHOL 3 MG MT LOZG: 1.0000 | LOZENGE | OROMUCOSAL | Status: DC | PRN

## 2022-07-14 MED ORDER — APIXABAN 2.5 MG PO TABS
2.5000 mg | ORAL_TABLET | Freq: Two times a day (BID) | ORAL | Status: DC
  Administered 2022-07-15: 2.5 mg via ORAL
  Filled 2022-07-14: qty 1

## 2022-07-14 MED ORDER — ONDANSETRON HCL 4 MG/2ML IJ SOLN
4.0000 mg | Freq: Four times a day (QID) | INTRAMUSCULAR | Status: DC | PRN
  Administered 2022-07-14 – 2022-07-15 (×2): 4 mg via INTRAVENOUS
  Filled 2022-07-14 (×2): qty 2

## 2022-07-14 MED ORDER — VITAMIN D 25 MCG (1000 UNIT) PO TABS
2000.0000 [IU] | ORAL_TABLET | Freq: Every day | ORAL | Status: DC
  Administered 2022-07-14 – 2022-07-16 (×3): 2000 [IU] via ORAL
  Filled 2022-07-14 (×3): qty 2

## 2022-07-14 MED ORDER — BUPIVACAINE-EPINEPHRINE (PF) 0.5% -1:200000 IJ SOLN
INTRAMUSCULAR | Status: DC | PRN
  Administered 2022-07-14: 18 mL via PERINEURAL

## 2022-07-14 MED ORDER — ACETAMINOPHEN 500 MG PO TABS
1000.0000 mg | ORAL_TABLET | Freq: Once | ORAL | Status: AC
  Administered 2022-07-14: 1000 mg via ORAL

## 2022-07-14 MED ORDER — METHOCARBAMOL 1000 MG/10ML IJ SOLN: 500.0000 mg | Freq: Four times a day (QID) | INTRAVENOUS | Status: DC | PRN

## 2022-07-14 MED ORDER — ACETAMINOPHEN 500 MG PO TABS
ORAL_TABLET | ORAL | Status: AC
  Filled 2022-07-14: qty 1

## 2022-07-14 MED ORDER — SODIUM CHLORIDE 0.9 % IV SOLN: INTRAVENOUS | Status: DC

## 2022-07-14 MED ORDER — HYDROMORPHONE HCL 1 MG/ML IJ SOLN: 0.5000 mg | INTRAMUSCULAR | Status: DC | PRN

## 2022-07-14 MED ORDER — METOCLOPRAMIDE HCL 5 MG PO TABS: 5.0000 mg | ORAL_TABLET | Freq: Three times a day (TID) | ORAL | Status: DC | PRN

## 2022-07-14 MED ORDER — ONDANSETRON HCL 4 MG PO TABS: 4.0000 mg | ORAL_TABLET | Freq: Four times a day (QID) | ORAL | Status: DC | PRN

## 2022-07-14 MED ORDER — DIPHENHYDRAMINE HCL 12.5 MG/5ML PO ELIX: 12.5000 mg | ORAL_SOLUTION | ORAL | Status: DC | PRN

## 2022-07-14 MED ORDER — POVIDONE-IODINE 10 % EX SWAB: 2.0000 | Freq: Once | CUTANEOUS | Status: DC

## 2022-07-14 MED ORDER — CEFAZOLIN SODIUM-DEXTROSE 2-4 GM/100ML-% IV SOLN
2.0000 g | INTRAVENOUS | Status: AC
  Administered 2022-07-14: 2 g via INTRAVENOUS

## 2022-07-14 MED ORDER — POLYETHYLENE GLYCOL 3350 17 G PO PACK: 17.0000 g | PACK | Freq: Every day | ORAL | Status: DC | PRN

## 2022-07-14 MED ORDER — PROPOFOL 500 MG/50ML IV EMUL
INTRAVENOUS | Status: DC | PRN
  Administered 2022-07-14: 50 ug/kg/min via INTRAVENOUS
  Administered 2022-07-14: 10 mg via INTRAVENOUS

## 2022-07-14 MED ORDER — ALUM & MAG HYDROXIDE-SIMETH 200-200-20 MG/5ML PO SUSP: 30.0000 mL | ORAL | Status: DC | PRN

## 2022-07-14 MED ORDER — BUPIVACAINE-EPINEPHRINE (PF) 0.5% -1:200000 IJ SOLN
INTRAMUSCULAR | Status: DC | PRN
  Administered 2022-07-14: 15 mL via PERINEURAL

## 2022-07-14 MED ORDER — TRANEXAMIC ACID-NACL 1000-0.7 MG/100ML-% IV SOLN
INTRAVENOUS | Status: AC
  Filled 2022-07-14: qty 100

## 2022-07-14 MED ORDER — OXYCODONE HCL 5 MG/5ML PO SOLN: 5.0000 mg | Freq: Once | ORAL | Status: DC | PRN

## 2022-07-14 MED ORDER — PANTOPRAZOLE SODIUM 40 MG PO TBEC
40.0000 mg | DELAYED_RELEASE_TABLET | Freq: Every day | ORAL | Status: DC
  Administered 2022-07-14 – 2022-07-16 (×3): 40 mg via ORAL
  Filled 2022-07-14 (×3): qty 1

## 2022-07-14 MED ORDER — AMISULPRIDE (ANTIEMETIC) 5 MG/2ML IV SOLN: 10.0000 mg | Freq: Once | INTRAVENOUS | Status: DC | PRN

## 2022-07-14 MED ORDER — METOCLOPRAMIDE HCL 5 MG/ML IJ SOLN: 5.0000 mg | Freq: Three times a day (TID) | INTRAMUSCULAR | Status: DC | PRN

## 2022-07-14 MED ORDER — 0.9 % SODIUM CHLORIDE (POUR BTL) OPTIME
TOPICAL | Status: DC | PRN
  Administered 2022-07-14: 1000 mL

## 2022-07-14 MED ORDER — TRANEXAMIC ACID-NACL 1000-0.7 MG/100ML-% IV SOLN
1000.0000 mg | INTRAVENOUS | Status: AC
  Administered 2022-07-14: 1000 mg via INTRAVENOUS

## 2022-07-14 MED ORDER — METHOCARBAMOL 500 MG PO TABS
500.0000 mg | ORAL_TABLET | Freq: Four times a day (QID) | ORAL | Status: DC | PRN
  Administered 2022-07-14 – 2022-07-15 (×4): 500 mg via ORAL
  Filled 2022-07-14 (×4): qty 1

## 2022-07-14 MED ORDER — SODIUM CHLORIDE 0.9 % IR SOLN
Status: DC | PRN
  Administered 2022-07-14: 3000 mL

## 2022-07-14 MED ORDER — FENTANYL CITRATE (PF) 250 MCG/5ML IJ SOLN
INTRAMUSCULAR | Status: DC | PRN
  Administered 2022-07-14: 75 ug via INTRAVENOUS
  Administered 2022-07-14: 25 ug via INTRAVENOUS

## 2022-07-14 MED ORDER — FENTANYL CITRATE (PF) 250 MCG/5ML IJ SOLN
INTRAMUSCULAR | Status: AC
  Filled 2022-07-14: qty 5

## 2022-07-14 MED ORDER — EPHEDRINE SULFATE-NACL 50-0.9 MG/10ML-% IV SOSY
PREFILLED_SYRINGE | INTRAVENOUS | Status: DC | PRN
  Administered 2022-07-14 (×2): 10 mg via INTRAVENOUS

## 2022-07-14 MED ORDER — OXYCODONE HCL 5 MG PO TABS: 5.0000 mg | ORAL_TABLET | Freq: Once | ORAL | Status: DC | PRN

## 2022-07-14 MED ORDER — DOXYCYCLINE HYCLATE 100 MG PO TABS
100.0000 mg | ORAL_TABLET | Freq: Two times a day (BID) | ORAL | Status: DC
  Administered 2022-07-14 – 2022-07-16 (×5): 100 mg via ORAL
  Filled 2022-07-14 (×5): qty 1

## 2022-07-14 MED ORDER — LACTATED RINGERS IV SOLN: INTRAVENOUS | Status: DC

## 2022-07-14 MED ORDER — CLONIDINE HCL (ANALGESIA) 100 MCG/ML EP SOLN
EPIDURAL | Status: DC | PRN
  Administered 2022-07-14: 100 ug

## 2022-07-14 MED ORDER — BUPIVACAINE-EPINEPHRINE (PF) 0.5% -1:200000 IJ SOLN
INTRAMUSCULAR | Status: AC
  Filled 2022-07-14: qty 30

## 2022-07-14 MED ORDER — FENTANYL CITRATE (PF) 100 MCG/2ML IJ SOLN: 25.0000 ug | INTRAMUSCULAR | Status: DC | PRN

## 2022-07-14 MED ORDER — DEXAMETHASONE SODIUM PHOSPHATE 10 MG/ML IJ SOLN
INTRAMUSCULAR | Status: DC | PRN
  Administered 2022-07-14: 10 mg via INTRAVENOUS

## 2022-07-14 MED ORDER — DOCUSATE SODIUM 100 MG PO CAPS
100.0000 mg | ORAL_CAPSULE | Freq: Two times a day (BID) | ORAL | Status: DC
  Administered 2022-07-14 – 2022-07-16 (×5): 100 mg via ORAL
  Filled 2022-07-14 (×5): qty 1

## 2022-07-14 MED ORDER — ACETAMINOPHEN 325 MG PO TABS: 325.0000 mg | ORAL_TABLET | Freq: Four times a day (QID) | ORAL | Status: DC | PRN

## 2022-07-14 MED ORDER — CEFAZOLIN SODIUM-DEXTROSE 2-4 GM/100ML-% IV SOLN
INTRAVENOUS | Status: AC
  Filled 2022-07-14: qty 100

## 2022-07-14 MED ORDER — PHENYLEPHRINE HCL-NACL 20-0.9 MG/250ML-% IV SOLN
INTRAVENOUS | Status: DC | PRN
  Administered 2022-07-14: 50 ug/min via INTRAVENOUS

## 2022-07-14 SURGICAL SUPPLY — 75 items
BAG COUNTER SPONGE SURGICOUNT (BAG) ×1 IMPLANT
BANDAGE ESMARK 6X9 LF (GAUZE/BANDAGES/DRESSINGS) ×1 IMPLANT
BLADE SAG 18X100X1.27 (BLADE) ×1 IMPLANT
BLADE SAW SGTL 13.0X1.19X90.0M (BLADE) IMPLANT
BNDG ELASTIC 6X5.8 VLCR STR LF (GAUZE/BANDAGES/DRESSINGS) ×2 IMPLANT
BNDG ESMARK 6X9 LF (GAUZE/BANDAGES/DRESSINGS) ×1
BOWL SMART MIX CTS (DISPOSABLE) ×1 IMPLANT
CEMENT BONE R 1X40 (Cement) ×2 IMPLANT
COMP MED POLY AS PERS S6-7 12 (Joint) ×1 IMPLANT
COMPONENT MED PLY PERSS6-7 12 (Joint) IMPLANT
COOLER ICEMAN CLASSIC (MISCELLANEOUS) IMPLANT
COVER SURGICAL LIGHT HANDLE (MISCELLANEOUS) ×1 IMPLANT
CUFF TOURN SGL QUICK 34 (TOURNIQUET CUFF) ×1
CUFF TOURN SGL QUICK 42 (TOURNIQUET CUFF) IMPLANT
CUFF TRNQT CYL 34X4.125X (TOURNIQUET CUFF) ×1 IMPLANT
DRAPE EXTREMITY T 121X128X90 (DISPOSABLE) ×1 IMPLANT
DRAPE HALF SHEET 40X57 (DRAPES) ×1 IMPLANT
DRAPE U-SHAPE 47X51 STRL (DRAPES) ×1 IMPLANT
DURAPREP 26ML APPLICATOR (WOUND CARE) ×1 IMPLANT
ELECT CAUTERY BLADE 6.4 (BLADE) ×1 IMPLANT
ELECT REM PT RETURN 9FT ADLT (ELECTROSURGICAL) ×1
ELECTRODE REM PT RTRN 9FT ADLT (ELECTROSURGICAL) ×1 IMPLANT
FACESHIELD WRAPAROUND (MASK) ×3 IMPLANT
FACESHIELD WRAPAROUND OR TEAM (MASK) ×2 IMPLANT
FEMUR CMT CR STD SZ 6 LT KNEE (Joint) ×1 IMPLANT
FEMUR CMTD CR STD SZ 6 LT KNEE (Joint) IMPLANT
GAUZE PAD ABD 8X10 STRL (GAUZE/BANDAGES/DRESSINGS) ×1 IMPLANT
GAUZE SPONGE 4X4 12PLY STRL (GAUZE/BANDAGES/DRESSINGS) ×1 IMPLANT
GAUZE XEROFORM 1X8 LF (GAUZE/BANDAGES/DRESSINGS) ×1 IMPLANT
GLOVE BIOGEL PI IND STRL 8 (GLOVE) ×2 IMPLANT
GLOVE ORTHO TXT STRL SZ7.5 (GLOVE) ×1 IMPLANT
GLOVE SURG ORTHO 8.0 STRL STRW (GLOVE) ×1 IMPLANT
GOWN STRL REUS W/ TWL LRG LVL3 (GOWN DISPOSABLE) IMPLANT
GOWN STRL REUS W/ TWL XL LVL3 (GOWN DISPOSABLE) ×2 IMPLANT
GOWN STRL REUS W/TWL LRG LVL3 (GOWN DISPOSABLE)
GOWN STRL REUS W/TWL XL LVL3 (GOWN DISPOSABLE) ×2
GUIDE TASP BOTTOM PS CD LT +0 (INSTRUMENTS) IMPLANT
HANDPIECE INTERPULSE COAX TIP (DISPOSABLE) ×1
HDLS TROCR DRIL PIN KNEE 75 (PIN) ×1
IMMOBILIZER KNEE 22 UNIV (SOFTGOODS) ×1 IMPLANT
IV NS 1000ML (IV SOLUTION) ×1
IV NS 1000ML BAXH (IV SOLUTION) ×1 IMPLANT
KIT BASIN OR (CUSTOM PROCEDURE TRAY) ×1 IMPLANT
KIT TURNOVER KIT B (KITS) ×1 IMPLANT
MANIFOLD NEPTUNE II (INSTRUMENTS) ×1 IMPLANT
NDL 18GX1X1/2 (RX/OR ONLY) (NEEDLE) IMPLANT
NEEDLE 18GX1X1/2 (RX/OR ONLY) (NEEDLE) IMPLANT
NEEDLE HYPO 22GX1.5 SAFETY (NEEDLE) IMPLANT
NS IRRIG 1000ML POUR BTL (IV SOLUTION) ×1 IMPLANT
PACK TOTAL JOINT (CUSTOM PROCEDURE TRAY) ×1 IMPLANT
PAD ARMBOARD 7.5X6 YLW CONV (MISCELLANEOUS) ×1 IMPLANT
PAD COLD SHLDR WRAP-ON (PAD) IMPLANT
PADDING CAST COTTON 6X4 STRL (CAST SUPPLIES) ×1 IMPLANT
PIN DRILL HDLS TROCAR 75 4PK (PIN) IMPLANT
SCREW FEMALE HEX FIX 25X2.5 (ORTHOPEDIC DISPOSABLE SUPPLIES) IMPLANT
SET HNDPC FAN SPRY TIP SCT (DISPOSABLE) ×1 IMPLANT
SET PAD KNEE POSITIONER (MISCELLANEOUS) ×1 IMPLANT
STAPLER VISISTAT 35W (STAPLE) ×1 IMPLANT
STEM POLY PAT PLY 29M KNEE (Knees) IMPLANT
STEM TIBIA 5 DEG SZ C L KNEE (Knees) IMPLANT
SUCTION FRAZIER HANDLE 10FR (MISCELLANEOUS) ×1
SUCTION TUBE FRAZIER 10FR DISP (MISCELLANEOUS) ×1 IMPLANT
SUT VIC AB 0 CT1 27 (SUTURE) ×2
SUT VIC AB 0 CT1 27XBRD ANBCTR (SUTURE) ×1 IMPLANT
SUT VIC AB 1 CT1 27 (SUTURE) ×3
SUT VIC AB 1 CT1 27XBRD ANBCTR (SUTURE) ×2 IMPLANT
SUT VIC AB 2-0 CT1 27 (SUTURE) ×2
SUT VIC AB 2-0 CT1 TAPERPNT 27 (SUTURE) ×2 IMPLANT
SYR 50ML LL SCALE MARK (SYRINGE) IMPLANT
TIBIA STEM 5 DEG SZ C L KNEE (Knees) ×1 IMPLANT
TOWEL GREEN STERILE (TOWEL DISPOSABLE) ×1 IMPLANT
TOWEL GREEN STERILE FF (TOWEL DISPOSABLE) ×1 IMPLANT
TRAY CATH 16FR W/PLASTIC CATH (SET/KITS/TRAYS/PACK) IMPLANT
TRAY FOLEY MTR SLVR 14FR STAT (SET/KITS/TRAYS/PACK) IMPLANT
WRAP KNEE MAXI GEL POST OP (GAUZE/BANDAGES/DRESSINGS) ×1 IMPLANT

## 2022-07-14 NOTE — Transfer of Care (Signed)
Immediate Anesthesia Transfer of Care Note  Patient: Kristina Burns  Procedure(s) Performed: LEFT TOTAL KNEE ARTHROPLASTY (Left: Knee)  Patient Location: PACU  Anesthesia Type:MAC combined with regional for post-op pain  Level of Consciousness: awake, alert , and oriented  Airway & Oxygen Therapy: Patient Spontanous Breathing  Post-op Assessment: Report given to RN and Post -op Vital signs reviewed and stable  Post vital signs: Reviewed and stable  Last Vitals:  Vitals Value Taken Time  BP 86/56 07/14/22 0932  Temp 98   Pulse 76 07/14/22 0935  Resp 17 07/14/22 0935  SpO2 97 % 07/14/22 0935  Vitals shown include unvalidated device data.  Last Pain:  Vitals:   07/14/22 0658  TempSrc:   PainSc: 0-No pain         Complications: No notable events documented.

## 2022-07-14 NOTE — Discharge Instructions (Addendum)
Information on my medicine - ELIQUIS (apixaban)  Why was Eliquis prescribed for you? Eliquis was prescribed for you to reduce the risk of blood clots forming after orthopedic surgery.    What do You need to know about Eliquis? Take your Eliquis TWICE DAILY - one tablet in the morning and one tablet in the evening with or without food.  It would be best to take the dose about the same time each day.  If you have difficulty swallowing the tablet whole please discuss with your pharmacist how to take the medication safely.  Take Eliquis exactly as prescribed by your doctor and DO NOT stop taking Eliquis without talking to the doctor who prescribed the medication.  Stopping without other medication to take the place of Eliquis may increase your risk of developing a clot.  After discharge, you should have regular check-up appointments with your healthcare provider that is prescribing your Eliquis.  What do you do if you miss a dose? If a dose of ELIQUIS is not taken at the scheduled time, take it as soon as possible on the same day and twice-daily administration should be resumed.  The dose should not be doubled to make up for a missed dose.  Do not take more than one tablet of ELIQUIS at the same time.  Important Safety Information A possible side effect of Eliquis is bleeding. You should call your healthcare provider right away if you experience any of the following: Bleeding from an injury or your nose that does not stop. Unusual colored urine (red or dark brown) or unusual colored stools (red or black). Unusual bruising for unknown reasons. A serious fall or if you hit your head (even if there is no bleeding).  Some medicines may interact with Eliquis and might increase your risk of bleeding or clotting while on Eliquis. To help avoid this, consult your healthcare provider or pharmacist prior to using any new prescription or non-prescription medications, including herbals,  vitamins, non-steroidal anti-inflammatory drugs (NSAIDs) and supplements.  This website has more information on Eliquis (apixaban): http://www.eliquis.com/eliquis/home   INSTRUCTIONS AFTER JOINT REPLACEMENT   Remove items at home which could result in a fall. This includes throw rugs or furniture in walking pathways ICE to the affected joint every three hours while awake for 30 minutes at a time, for at least the first 3-5 days, and then as needed for pain and swelling.  Continue to use ice for pain and swelling. You may notice swelling that will progress down to the foot and ankle.  This is normal after surgery.  Elevate your leg when you are not up walking on it.   Continue to use the breathing machine you got in the hospital (incentive spirometer) which will help keep your temperature down.  It is common for your temperature to cycle up and down following surgery, especially at night when you are not up moving around and exerting yourself.  The breathing machine keeps your lungs expanded and your temperature down.   DIET:  As you were doing prior to hospitalization, we recommend a well-balanced diet.  DRESSING / WOUND CARE / SHOWERING  Keep the surgical dressing until follow up.  The dressing is water proof, so you can shower without any extra covering.  IF THE DRESSING FALLS OFF or the wound gets wet inside, change the dressing with sterile gauze.  Please use good hand washing techniques before changing the dressing.  Do not use any lotions or creams on the incision until instructed  by your surgeon.    ACTIVITY  Increase activity slowly as tolerated, but follow the weight bearing instructions below.   No driving for 6 weeks or until further direction given by your physician.  You cannot drive while taking narcotics.  No lifting or carrying greater than 10 lbs. until further directed by your surgeon. Avoid periods of inactivity such as sitting longer than an hour when not asleep. This helps  prevent blood clots.  You may return to work once you are authorized by your doctor.     WEIGHT BEARING   Weight bearing as tolerated with assist device (walker, cane, etc) as directed, use it as long as suggested by your surgeon or therapist, typically at least 4-6 weeks.   EXERCISES  Results after joint replacement surgery are often greatly improved when you follow the exercise, range of motion and muscle strengthening exercises prescribed by your doctor. Safety measures are also important to protect the joint from further injury. Any time any of these exercises cause you to have increased pain or swelling, decrease what you are doing until you are comfortable again and then slowly increase them. If you have problems or questions, call your caregiver or physical therapist for advice.   Rehabilitation is important following a joint replacement. After just a few days of immobilization, the muscles of the leg can become weakened and shrink (atrophy).  These exercises are designed to build up the tone and strength of the thigh and leg muscles and to improve motion. Often times heat used for twenty to thirty minutes before working out will loosen up your tissues and help with improving the range of motion but do not use heat for the first two weeks following surgery (sometimes heat can increase post-operative swelling).   These exercises can be done on a training (exercise) mat, on the floor, on a table or on a bed. Use whatever works the best and is most comfortable for you.    Use music or television while you are exercising so that the exercises are a pleasant break in your day. This will make your life better with the exercises acting as a break in your routine that you can look forward to.   Perform all exercises about fifteen times, three times per day or as directed.  You should exercise both the operative leg and the other leg as well.  Exercises include:   Quad Sets - Tighten up the muscle  on the front of the thigh (Quad) and hold for 5-10 seconds.   Straight Leg Raises - With your knee straight (if you were given a brace, keep it on), lift the leg to 60 degrees, hold for 3 seconds, and slowly lower the leg.  Perform this exercise against resistance later as your leg gets stronger.  Leg Slides: Lying on your back, slowly slide your foot toward your buttocks, bending your knee up off the floor (only go as far as is comfortable). Then slowly slide your foot back down until your leg is flat on the floor again.  Angel Wings: Lying on your back spread your legs to the side as far apart as you can without causing discomfort.  Hamstring Strength:  Lying on your back, push your heel against the floor with your leg straight by tightening up the muscles of your buttocks.  Repeat, but this time bend your knee to a comfortable angle, and push your heel against the floor.  You may put a pillow under the heel to  make it more comfortable if necessary.   A rehabilitation program following joint replacement surgery can speed recovery and prevent re-injury in the future due to weakened muscles. Contact your doctor or a physical therapist for more information on knee rehabilitation.    CONSTIPATION  Constipation is defined medically as fewer than three stools per week and severe constipation as less than one stool per week.  Even if you have a regular bowel pattern at home, your normal regimen is likely to be disrupted due to multiple reasons following surgery.  Combination of anesthesia, postoperative narcotics, change in appetite and fluid intake all can affect your bowels.   YOU MUST use at least one of the following options; they are listed in order of increasing strength to get the job done.  They are all available over the counter, and you may need to use some, POSSIBLY even all of these options:    Drink plenty of fluids (prune juice may be helpful) and high fiber foods Colace 100 mg by mouth  twice a day  Senokot for constipation as directed and as needed Dulcolax (bisacodyl), take with full glass of water  Miralax (polyethylene glycol) once or twice a day as needed.  If you have tried all these things and are unable to have a bowel movement in the first 3-4 days after surgery call either your surgeon or your primary doctor.    If you experience loose stools or diarrhea, hold the medications until you stool forms back up.  If your symptoms do not get better within 1 week or if they get worse, check with your doctor.  If you experience "the worst abdominal pain ever" or develop nausea or vomiting, please contact the office immediately for further recommendations for treatment.   ITCHING:  If you experience itching with your medications, try taking only a single pain pill, or even half a pain pill at a time.  You can also use Benadryl over the counter for itching or also to help with sleep.   TED HOSE STOCKINGS:  Use stockings on both legs until for at least 2 weeks or as directed by physician office. They may be removed at night for sleeping.  MEDICATIONS:  See your medication summary on the "After Visit Summary" that nursing will review with you.  You may have some home medications which will be placed on hold until you complete the course of blood thinner medication.  It is important for you to complete the blood thinner medication as prescribed.  PRECAUTIONS:  If you experience chest pain or shortness of breath - call 911 immediately for transfer to the hospital emergency department.   If you develop a fever greater that 101 F, purulent drainage from wound, increased redness or drainage from wound, foul odor from the wound/dressing, or calf pain - CONTACT YOUR SURGEON.                                                   FOLLOW-UP APPOINTMENTS:  If you do not already have a post-op appointment, please call the office for an appointment to be seen by your surgeon.  Guidelines for how  soon to be seen are listed in your "After Visit Summary", but are typically between 1-4 weeks after surgery.  OTHER INSTRUCTIONS:   Knee Replacement:  Do not place pillow under knee,  focus on keeping the knee straight while resting. CPM instructions: 0-90 degrees, 2 hours in the morning, 2 hours in the afternoon, and 2 hours in the evening. Place foam block, curve side up under heel at all times except when in CPM or when walking.  DO NOT modify, tear, cut, or change the foam block in any way.  POST-OPERATIVE OPIOID TAPER INSTRUCTIONS: It is important to wean off of your opioid medication as soon as possible. If you do not need pain medication after your surgery it is ok to stop day one. Opioids include: Codeine, Hydrocodone(Norco, Vicodin), Oxycodone(Percocet, oxycontin) and hydromorphone amongst others.  Long term and even short term use of opiods can cause: Increased pain response Dependence Constipation Depression Respiratory depression And more.  Withdrawal symptoms can include Flu like symptoms Nausea, vomiting And more Techniques to manage these symptoms Hydrate well Eat regular healthy meals Stay active Use relaxation techniques(deep breathing, meditating, yoga) Do Not substitute Alcohol to help with tapering If you have been on opioids for less than two weeks and do not have pain than it is ok to stop all together.  Plan to wean off of opioids This plan should start within one week post op of your joint replacement. Maintain the same interval or time between taking each dose and first decrease the dose.  Cut the total daily intake of opioids by one tablet each day Next start to increase the time between doses. The last dose that should be eliminated is the evening dose.   MAKE SURE YOU:  Understand these instructions.  Get help right away if you are not doing well or get worse.    Thank you for letting us be a part of your medical care team.  It is a privilege we  respect greatly.  We hope these instructions will help you stay on track for a fast and full recovery!     Dental Antibiotics:  In most cases prophylactic antibiotics for Dental procdeures after total joint surgery are not necessary.  Exceptions are as follows:  1. History of prior total joint infection  2. Severely immunocompromised (Organ Transplant, cancer chemotherapy, Rheumatoid biologic meds such as [place]ed[place])  3. Poorly controlled diabetes (A1C &gt; 8.0, blood glucose over 200)  If you have one of these conditions, contact your surgeon for an antibiotic prescription, prior to your dental procedure.

## 2022-07-14 NOTE — H&P (Signed)
TOTAL KNEE ADMISSION H&P  Patient is being admitted for left total knee arthroplasty.  Subjective:  Chief Complaint:left knee pain.  HPI: Kristina Burns, 76 y.o. female, has a history of pain and functional disability in the left knee due to arthritis and has failed non-surgical conservative treatments for greater than 12 weeks to includeNSAID's and/or analgesics, corticosteriod injections, flexibility and strengthening excercises, use of assistive devices, weight reduction as appropriate, and activity modification.  Onset of symptoms was gradual, starting 1 years ago with gradually worsening course since that time. The patient noted no past surgery on the left knee(s).  Patient currently rates pain in the left knee(s) at 9 out of 10 with activity. Patient has night pain, worsening of pain with activity and weight bearing, pain that interferes with activities of daily living, pain with passive range of motion, crepitus, and joint swelling.  Patient has evidence of subchondral sclerosis, periarticular osteophytes, and joint space narrowing by imaging studies. There is no active infection.  Patient Active Problem List   Diagnosis Date Noted   Status post total knee replacement, right 08/21/2018   Chronic pain of both knees 07/10/2017   Unilateral primary osteoarthritis, left knee 07/10/2017   Past Medical History:  Diagnosis Date   Arthritis    Asthma    High cholesterol     Past Surgical History:  Procedure Laterality Date   ABDOMINAL HYSTERECTOMY     BACK SURGERY     TOTAL KNEE ARTHROPLASTY Right 08/21/2018   Procedure: RIGHT TOTAL KNEE ARTHROPLASTY;  Surgeon: Mcarthur Rossetti, MD;  Location: [facility];  Service: Orthopedics;  Laterality: Right;    Current Facility-Administered Medications  Medication Dose Route Frequency Provider Last Rate Last Admin   acetaminophen (TYLENOL) 500 MG tablet            acetaminophen (TYLENOL) 500 MG tablet            ceFAZolin (ANCEF) 2-4  GM/100ML-% IVPB            ceFAZolin (ANCEF) IVPB 2g/100 mL premix  2 g Intravenous On Call to OR Pete Pelt, PA-C       chlorhexidine (PERIDEX) 0.12 % solution            lactated ringers infusion   Intravenous Continuous Stoltzfus, Gregory P, DO       povidone-iodine 10 % swab 2 Application  2 Application Topical Once Pete Pelt, PA-C       tranexamic acid (CYKLOKAPRON) '1000MG'$ /123m IVPB            tranexamic acid (CYKLOKAPRON) IVPB 1,000 mg  1,000 mg Intravenous To OR CPete Pelt PA-C       Allergies  Allergen Reactions   Aspirin Swelling    Lips swell up.  She hasn't taken in awhile   Nabumetone Itching    cracked skin    Social History   Tobacco Use   Smoking status: Never   Smokeless tobacco: Never  Substance Use Topics   Alcohol use: Never    Family History  Problem Relation Age of Onset   Breast cancer Neg Hx    Colon cancer Neg Hx    Colon polyps Neg Hx    Stomach cancer Neg Hx    Esophageal cancer Neg Hx      Review of Systems  Musculoskeletal:  Positive for gait problem and joint swelling.  All other systems reviewed and are negative.   Objective:  Physical Exam Vitals reviewed.  Constitutional:  Appearance: Normal appearance. She is normal weight.  HENT:     Head: Normocephalic and atraumatic.  Eyes:     Extraocular Movements: Extraocular movements intact.     Pupils: Pupils are equal, round, and reactive to light.  Cardiovascular:     Rate and Rhythm: Normal rate.     Pulses: Normal pulses.  Pulmonary:     Effort: Pulmonary effort is normal.     Breath sounds: Normal breath sounds.  Abdominal:     Palpations: Abdomen is soft.  Musculoskeletal:     Cervical back: Normal range of motion and neck supple.     Left knee: Bony tenderness and crepitus present. Decreased range of motion. Tenderness present over the medial joint line and lateral joint line. Abnormal alignment.  Neurological:     Mental Status: She is alert and  oriented to person, place, and time.     Vital signs in last 24 hours: Temp:  [98.1 F (36.7 C)] 98.1 F (36.7 C) (12/07 0556) Pulse Rate:  [89] 89 (12/07 0556) Resp:  [18] 18 (12/07 0556) BP: (119)/(66) 119/66 (12/07 0556) SpO2:  [100 %] 100 % (12/07 0556) Weight:  [83.9 kg] 83.9 kg (12/07 0556)  Labs:   Estimated body mass index is 32.77 kg/m as calculated from the following:   Height as of this encounter: '5\' 3"'$  (1.6 m).   Weight as of this encounter: 83.9 kg.   Imaging Review Plain radiographs demonstrate severe degenerative joint disease of the left knee(s). The overall alignment ismild varus. The bone quality appears to be good for age and reported activity level.      Assessment/Plan:  End stage arthritis, left knee   The patient history, physical examination, clinical judgment of the provider and imaging studies are consistent with end stage degenerative joint disease of the left knee(s) and total knee arthroplasty is deemed medically necessary. The treatment options including medical management, injection therapy arthroscopy and arthroplasty were discussed at length. The risks and benefits of total knee arthroplasty were presented and reviewed. The risks due to aseptic loosening, infection, stiffness, patella tracking problems, thromboembolic complications and other imponderables were discussed. The patient acknowledged the explanation, agreed to proceed with the plan and consent was signed. Patient is being admitted for inpatient treatment for surgery, pain control, PT, OT, prophylactic antibiotics, VTE prophylaxis, progressive ambulation and ADL's and discharge planning. The patient is planning to be discharged home with home health services

## 2022-07-14 NOTE — Op Note (Signed)
Operative Note  Date of operation: 07/14/2022 Preoperative diagnosis: Left knee severe osteoarthritis Postoperative diagnosis: Same  Procedure: Left cemented total knee arthroplasty  Implants: Biomet/Zimmer persona knee system with size 6 left CR standard femur, size C left tibial tray, 12 mm medial congruent left polythene insert, 29 mm patella button  Surgeon: Lind Guest. Ninfa Linden, MD Assistant: Benita Stabile, PA-C  Anesthesia: #1 left lower extremity regional block, #2 spinal, #3 local Tourniquet time: Under 1 hour EBL: Less than 357 cc Complications: None Antibiotics: 2 g IV Ancef  Indications: Kristina Burns is a very pleasant and active 76 year old female well-known to me.  Actually replaced her right knee several years ago due to severe osteoarthritis.  She is developed significant osteoarthritis of her left knee as well and is quite severe on x-ray and clinical exam.  She has tried and failed conservative treatment for some time now as a relates to her left knee.  At this point given the failure conservative treatment and her daily left knee pain that is detrimentally affecting her mobility, her quality of life and actives day living, she wishes to proceed with a total knee arthroplasty.  Having had this done before she is fully aware the risk of acute blood loss anemia, nerve vessel injury, fracture, infection, DVT, implant failure, and wound healing issues.  She understands her goals are hopefully decrease pain, improve mobility, and improve quality of life.  Procedure description: After informed consent was obtained and the appropriate left knee was marked, anesthesia obtained a regional left lower extremity block in the holding room.  The patient was then brought to the operating room and set up on the operating table where spinal anesthesia was obtained.  She was laid in supine position on the operating table and a Foley catheter was placed.  A nonsterile tourniquet placed around her upper  left thigh and her left thigh, knee, leg, ankle and foot were prepped and draped with DuraPrep and sterile drapes.  A sterile stockinette was applied as well.  A timeout was called and she was then applied as correct patient and the correct left knee.  An Esmarch was then used to wrap out the leg and the tourniquet was plated to 3 to millimeters of pressure.  I then made a direct midline incision over the patella and carried this proximally distally.  Dissection was carried down the knee joint and a medial parapatellar arthrotomy was made.  A large joint effusion was encountered and we found osteophytes in all 3 compartments and significant wear throughout the knee.  We removed osteophytes from all 3 compartments as well as remnants of the ACL, medial and lateral meniscus.  We then used an extramedullary cutting guide for making her proximal tibia cut correction varus and valgus at 3 degree slope.  We made this cut to take 2 mm off the low side.  The cut was made without difficulty.  We then went to the femur and made a intramedullary start site for an intramedullary based cutting guide for a left knee at 5 degrees externally rotated and a 10 mm distal femoral cut.  We made that knee Without difficulty and brought the knee back down to full extension and achieve full extension with a 10 mm block.  We then went back to the femur and put a femoral sizing guide based off the epicondylar axis.  From there we chose a size 6 femur.  We put a 4-in-1 cutting block for a size 6 femur and made her  anterior and posterior cuts followed by her chamfer cuts.  She was turned back to the tibia.  We chose a size C left tibial tray for coverage over the tibial plateau setting the rotation of the tibial tubercle and the femur.  We then did our drill hole and keel punch off of this.  We then trialed our size C left tibia followed by our size 6 standard CR left femur.  We went with a 10 mm medial congruent trial insert and went up to a  12 mm insert we are pleased with range of motion and stability with a 12 mm insert.  We then made her patella cut and drilled 3 holes for a size 29 patella button.  With all transportation in the knee we put her through several cycles of motion we are pleased with range of motion and stability.  We then removed trial instrumentation from the knee.  The knee was irrigated with normal saline solution.  We then placed our Marcaine with epinephrine around the arthrotomy.  The cement was mixed in with the knee in a flexed position we cemented our Biomet Zimmer size C left tibial tray followed by our size 6 left CR standard femur.  We placed our 12 mm thickness medial congruent polythene insert and cemented our size 29 patella button.  We then held the knee fully extended and compressed to allow the cement to harden.  Once it hardened we put the knee through several cycles of motion we are pleased range of motion and stability.  The tourniquet was let down hemostasis was obtained electrocautery.  The arthrotomy is closed with interrupted #1 Vicryl suture followed by 0 Vicryl close the deep tissue and 2-0 Vicryl close subcutaneous tissue.  The skin was closed with staples.  Well-padded sterile dressing was applied.  She was taken recovery in stable addition with all final counts being correct and no complications noted.  Benita Stabile, PA-C did assist in the entire case and his assistance was crucial for soft tissue retraction and management, helping guide implant placement and a layered closure of the wound.

## 2022-07-14 NOTE — Anesthesia Procedure Notes (Signed)
Spinal  Patient location during procedure: OR Start time: 07/14/2022 7:39 AM End time: 07/14/2022 7:43 AM Reason for block: surgical anesthesia Staffing Performed: anesthesiologist  Anesthesiologist: Brennan Bailey, MD Performed by: Brennan Bailey, MD Authorized by: Brennan Bailey, MD   Preanesthetic Checklist Completed: patient identified, IV checked, risks and benefits discussed, surgical consent, monitors and equipment checked, pre-op evaluation and timeout performed Spinal Block Patient position: sitting Prep: DuraPrep and site prepped and draped Patient monitoring: continuous pulse ox, blood pressure and heart rate Approach: midline Location: L3-4 Injection technique: single-shot Needle Needle type: Pencan  Needle gauge: 24 G Needle length: 9 cm Assessment Events: CSF return Additional Notes Risks, benefits, and alternative discussed. Patient gave consent to procedure. Prepped and draped in sitting position. Patient sedated but responsive to voice. Clear CSF obtained after 2 needle redirections. Free flow of CSF, however not able to aspirate with syringe despite repositioning. No pain or paraesthesias with injection. Patient tolerated procedure well. Vital signs stable. Tawny Asal, MD

## 2022-07-14 NOTE — Plan of Care (Signed)
  Problem: Education: Goal: Knowledge of the prescribed therapeutic regimen will improve Outcome: Progressing   Problem: Activity: Goal: Ability to avoid complications of mobility impairment will improve Outcome: Progressing Goal: Range of joint motion will improve Outcome: Progressing

## 2022-07-14 NOTE — Anesthesia Procedure Notes (Addendum)
Anesthesia Regional Block: Adductor canal block   Pre-Anesthetic Checklist: , timeout performed,  Correct Patient, Correct Site, Correct Laterality,  Correct Procedure, Correct Position, site marked,  Risks and benefits discussed,  Pre-op evaluation,  At surgeon's request and post-op pain management  Laterality: Left  Prep: Maximum Sterile Barrier Precautions used, chloraprep       Needles:  Injection technique: Single-shot  Needle Type: Echogenic Stimulator Needle     Needle Length: 9cm  Needle Gauge: 22     Additional Needles:   Procedures:,,,, ultrasound used (permanent image in chart),,    Narrative:  Start time: 07/14/2022 7:09 AM End time: 07/14/2022 7:12 AM Injection made incrementally with aspirations every 5 mL.  Performed by: Personally  Anesthesiologist: Brennan Bailey, MD  Additional Notes: Risks, benefits, and alternative discussed. Patient gave consent for procedure. Patient prepped and draped in sterile fashion. Sedation administered, patient remains easily responsive to voice. Relevant anatomy identified with ultrasound guidance. Local anesthetic given in 5cc increments with no signs or symptoms of intravascular injection. No pain or paraesthesias with injection. Patient monitored throughout procedure with signs of LAST or immediate complications. Tolerated well. Ultrasound image placed in chart.  Tawny Asal, MD

## 2022-07-14 NOTE — Evaluation (Signed)
Physical Therapy Evaluation Patient Details Name: Kristina Burns MRN: [id number] DOB: [birth year] Today's Date: 07/14/2022  History of Present Illness  Patient is a 76 y/o female with PMH asthma, arthritis, high cholesterol and R TKA 2020.  She is now s/p L TKA due to osteoarthritis and pain.  Clinical Impression  Patient presents with decreased mobility due to pain and decreased ROM L LE.  Currently min A for up with RW stepping to chair.  Patient previously independent.  Daughter can help intermittently and pt reports spouse unable to assist.  PT will continue to follow.  Noted HHPT follow up per MD.        Recommendations for follow up therapy are one component of a multi-disciplinary discharge planning process, led by the attending physician.  Recommendations may be updated based on patient status, additional functional criteria and insurance authorization.  Follow Up Recommendations Home health PT      Assistance Recommended at Discharge Intermittent Supervision/Assistance  Patient can return home with the following  A little help with walking and/or transfers;A little help with bathing/dressing/bathroom;Help with stairs or ramp for entrance;Assist for transportation;Assistance with cooking/housework    Equipment Recommendations None recommended by PT  Recommendations for Other Services       Functional Status Assessment Patient has had a recent decline in their functional status and demonstrates the ability to make significant improvements in function in a reasonable and predictable amount of time.     Precautions / Restrictions Precautions Precautions: Fall;Knee Required Braces or Orthoses: Knee Immobilizer - Left Restrictions Weight Bearing Restrictions: No LLE Weight Bearing: Weight bearing as tolerated      Mobility  Bed Mobility Overal bed mobility: Needs Assistance Bed Mobility: Supine to Sit     Supine to sit: Min assist, HOB elevated     General bed  mobility comments: assist for L LE    Transfers Overall transfer level: Needs assistance Equipment used: Rolling walker (2 wheels) Transfers: Sit to/from Stand Sit to Stand: Min assist           General transfer comment: for balance and cues for hand placement,    Ambulation/Gait Ambulation/Gait assistance: Min assist Gait Distance (Feet): 2 Feet Assistive device: Rolling walker (2 wheels) Gait Pattern/deviations: Step-to pattern, Decreased weight shift to left, Antalgic       General Gait Details: step to pattern with walker and cues for technique, turning to back up to recliner  Stairs            Wheelchair Mobility    Modified Rankin (Stroke Patients Only)       Balance Overall balance assessment: Needs assistance   Sitting balance-Leahy Scale: Good     Standing balance support: Bilateral upper extremity supported Standing balance-Leahy Scale: Poor Standing balance comment: UE support for balance                             Pertinent Vitals/Pain Pain Assessment Pain Assessment: Faces Faces Pain Scale: Hurts little more Pain Location: L knee with mobility Pain Descriptors / Indicators: Grimacing, Guarding Pain Intervention(s): Monitored during session, Repositioned, Patient requesting pain meds-RN notified    Home Living Family/patient expects to be discharged to:: Private residence Living Arrangements: Spouse/significant other Available Help at Discharge: Available 24 hours/day Type of Home: House Home Access: Stairs to enter Entrance Stairs-Rails: Can reach both Entrance Stairs-Number of Steps: 3   Home Layout: One level Home Equipment: Conservation officer, nature (2 wheels);Shower seat;BSC/3in1  Prior Function Prior Level of Function : Independent/Modified Independent                     Hand Dominance   Dominant Hand: Right    Extremity/Trunk Assessment   Upper Extremity Assessment Upper Extremity Assessment: Overall  WFL for tasks assessed    Lower Extremity Assessment Lower Extremity Assessment: LLE deficits/detail LLE Deficits / Details: ankle AROM WFL, knee flexion about 10-40 in supine, quad activation noted.       Communication   Communication: No difficulties  Cognition Arousal/Alertness: Awake/alert Behavior During Therapy: WFL for tasks assessed/performed Overall Cognitive Status: Within Functional Limits for tasks assessed                                          General Comments      Exercises Total Joint Exercises Ankle Circles/Pumps: AROM, 10 reps, Both, Supine Quad Sets: AROM, 5 reps, Left, Supine Heel Slides: AAROM, Both, 5 reps, Supine, AROM   Assessment/Plan    PT Assessment Patient needs continued PT services  PT Problem List Decreased range of motion;Decreased mobility;Decreased strength;Decreased balance;Decreased activity tolerance;Decreased knowledge of precautions       PT Treatment Interventions DME instruction;Functional mobility training;Balance training;Patient/family education;Therapeutic activities;Gait training;Stair training;Therapeutic exercise    PT Goals (Current goals can be found in the Care Plan section)  Acute Rehab PT Goals Patient Stated Goal: to return to independent PT Goal Formulation: With patient Time For Goal Achievement: 07/21/22 Potential to Achieve Goals: Good    Frequency 7X/week     Co-evaluation               AM-PAC PT "6 Clicks" Mobility  Outcome Measure Help needed turning from your back to your side while in a flat bed without using bedrails?: A Little Help needed moving from lying on your back to sitting on the side of a flat bed without using bedrails?: A Little Help needed moving to and from a bed to a chair (including a wheelchair)?: A Little Help needed standing up from a chair using your arms (e.g., wheelchair or bedside chair)?: A Little Help needed to walk in hospital room?: Total Help  needed climbing 3-5 steps with a railing? : Total 6 Click Score: 14    End of Session Equipment Utilized During Treatment: Gait belt;Left knee immobilizer Activity Tolerance: Patient tolerated treatment well Patient left: in chair Nurse Communication: Mobility status PT Visit Diagnosis: Difficulty in walking, not elsewhere classified (R26.2);Pain Pain - Right/Left: Left Pain - part of body: Knee    Time: 0539-7673 PT Time Calculation (min) (ACUTE ONLY): 25 min   Charges:   PT Evaluation $PT Eval Low Complexity: 1 Low PT Treatments $Therapeutic Activity: 8-22 mins        Magda Kiel, PT Acute Rehabilitation Services Office:[phone] 07/14/2022   Kristina Burns 07/14/2022, 4:05 PM

## 2022-07-14 NOTE — Anesthesia Postprocedure Evaluation (Signed)
Anesthesia Post Note  Patient: Kristina Burns  Procedure(s) Performed: LEFT TOTAL KNEE ARTHROPLASTY (Left: Knee)     Patient location during evaluation: PACU Anesthesia Type: Spinal Level of consciousness: awake and alert Pain management: pain level controlled Vital Signs Assessment: post-procedure vital signs reviewed and stable Respiratory status: spontaneous breathing, nonlabored ventilation and respiratory function stable Cardiovascular status: blood pressure returned to baseline Postop Assessment: no apparent nausea or vomiting, spinal receding, no headache and no backache Anesthetic complications: no   No notable events documented.  Last Vitals:  Vitals:   07/14/22 1054 07/14/22 1109  BP: 110/69 (!) 104/54  Pulse: 76 74  Resp: 20 18  Temp: (!) 36.3 C 36.4 C  SpO2: 97% 99%    Last Pain:  Vitals:   07/14/22 1054  TempSrc:   PainSc: 0-No pain                 Marthenia Rolling

## 2022-07-15 ENCOUNTER — Encounter (HOSPITAL_COMMUNITY): Payer: Self-pay | Admitting: Orthopaedic Surgery

## 2022-07-15 ENCOUNTER — Telehealth: Payer: Self-pay

## 2022-07-15 DIAGNOSIS — Z96651 Presence of right artificial knee joint: Secondary | ICD-10-CM | POA: Diagnosis present

## 2022-07-15 DIAGNOSIS — M25562 Pain in left knee: Secondary | ICD-10-CM | POA: Diagnosis present

## 2022-07-15 DIAGNOSIS — M1712 Unilateral primary osteoarthritis, left knee: Secondary | ICD-10-CM | POA: Diagnosis present

## 2022-07-15 DIAGNOSIS — Z886 Allergy status to analgesic agent status: Secondary | ICD-10-CM | POA: Diagnosis not present

## 2022-07-15 DIAGNOSIS — D62 Acute posthemorrhagic anemia: Secondary | ICD-10-CM | POA: Diagnosis not present

## 2022-07-15 DIAGNOSIS — E7800 Pure hypercholesterolemia, unspecified: Secondary | ICD-10-CM | POA: Diagnosis present

## 2022-07-15 DIAGNOSIS — J45909 Unspecified asthma, uncomplicated: Secondary | ICD-10-CM | POA: Diagnosis present

## 2022-07-15 DIAGNOSIS — Z90710 Acquired absence of both cervix and uterus: Secondary | ICD-10-CM | POA: Diagnosis not present

## 2022-07-15 LAB — CBC
HCT: 19.7 % — ABNORMAL LOW (ref 36.0–46.0)
HCT: 21.3 % — ABNORMAL LOW (ref 36.0–46.0)
HCT: 28.5 % — ABNORMAL LOW (ref 36.0–46.0)
Hemoglobin: 6.4 g/dL — CL (ref 12.0–15.0)
Hemoglobin: 6.4 g/dL — CL (ref 12.0–15.0)
Hemoglobin: 9.4 g/dL — ABNORMAL LOW (ref 12.0–15.0)
MCH: 29.5 pg (ref 26.0–34.0)
MCH: 28.4 pg (ref 26.0–34.0)
MCH: 28.9 pg (ref 26.0–34.0)
MCHC: 32.5 g/dL (ref 30.0–36.0)
MCHC: 30 g/dL (ref 30.0–36.0)
MCHC: 33 g/dL (ref 30.0–36.0)
MCV: 90.8 fL (ref 80.0–100.0)
MCV: 94.7 fL (ref 80.0–100.0)
MCV: 87.7 fL (ref 80.0–100.0)
Platelets: 242 10*3/uL (ref 150–400)
Platelets: 216 10*3/uL (ref 150–400)
Platelets: 215 10*3/uL (ref 150–400)
RBC: 2.17 MIL/uL — ABNORMAL LOW (ref 3.87–5.11)
RBC: 2.25 MIL/uL — ABNORMAL LOW (ref 3.87–5.11)
RBC: 3.25 MIL/uL — ABNORMAL LOW (ref 3.87–5.11)
RDW: 13.8 % (ref 11.5–15.5)
RDW: 13.8 % (ref 11.5–15.5)
RDW: 15 % (ref 11.5–15.5)
WBC: 11 10*3/uL — ABNORMAL HIGH (ref 4.0–10.5)
WBC: 9.2 10*3/uL (ref 4.0–10.5)
WBC: 10.1 10*3/uL (ref 4.0–10.5)
nRBC: 0 % (ref 0.0–0.2)
nRBC: 0 % (ref 0.0–0.2)
nRBC: 0 % (ref 0.0–0.2)

## 2022-07-15 LAB — BASIC METABOLIC PANEL
Anion gap: 9 (ref 5–15)
BUN: 14 mg/dL (ref 8–23)
CO2: 23 mmol/L (ref 22–32)
Calcium: 8.7 mg/dL — ABNORMAL LOW (ref 8.9–10.3)
Chloride: 104 mmol/L (ref 98–111)
Creatinine, Ser: 0.68 mg/dL (ref 0.44–1.00)
GFR, Estimated: >60 mL/min (ref >60)
Glucose, Bld: 154 mg/dL — ABNORMAL HIGH (ref 70–99)
Potassium: 4.1 mmol/L (ref 3.5–5.1)
Sodium: 136 mmol/L (ref 135–145)

## 2022-07-15 LAB — PREPARE RBC (CROSSMATCH)

## 2022-07-15 MED ORDER — SODIUM CHLORIDE 0.9% IV SOLUTION: Freq: Once | INTRAVENOUS | Status: AC

## 2022-07-15 NOTE — Progress Notes (Incomplete)
Main lab called this morning with a critical hemoglobin 6.4.  Will contact on call MD. Vira Agar [phone] messaging service.  Awaiting repsonse.

## 2022-07-15 NOTE — Progress Notes (Signed)
Hgb dropped to 6.4 this PM and pt is getting PRBC. Ok to hold apixaban for DVT px per Dr. Erlinda Hong.  Onnie Boer, PharmD, BCIDP, AAHIVP, CPP Infectious Disease Pharmacist 07/15/2022 9:16 PM

## 2022-07-15 NOTE — Telephone Encounter (Signed)
Pt is s/p a left total knee yesterday and nurse from [facility] called to advise family is concerned about the pt's HGB this am and would like a CBC this am before another transfusion. The pt's son in law is Dr. Nadyne Coombes with cardiology and would like this lab repeated this morning. Please call [phone] advised could call [facility] direct and nurse asked to leave a message.

## 2022-07-15 NOTE — Telephone Encounter (Signed)
Pt is in the hospital s/p total knee this week. Mitsy calilng form [facility] for in patient orders for her please call to advise. [phone]

## 2022-07-15 NOTE — Progress Notes (Signed)
Physical Therapy Treatment Patient Details Name: Kristina Burns MRN: [id number] DOB: 08-May-1946 Today's Date: 07/15/2022   History of Present Illness Patient is a 76 y/o female with PMH asthma, arthritis, high cholesterol and R TKA 2020.  She is now s/p L TKA due to osteoarthritis and pain.    PT Comments    Pt received in supine after recently transferring from chair to bed with RN assist and had just been medicated for pain, second unit of blood still transfusing. Pt agreeable to bed-level session for instruction on LLE HEP and L knee precautions. Pt receptive to instruction on use of iceman machine, need to refill machine frequently to maintain cold, use of KI for any OOB mobility due to continued L quad weakness during exercises. Pt needing AA for SLR, SAQ, hip abd, and heel slides. Pt given gait belt and instructed on using it for fall risk prevention/balance but also as leg lifter and to assist with heel slides/HEP program. Pt and daughter present and receptive. MD entering room at end of session.  Pt continues to benefit from PT services to progress toward functional mobility goals, plan to initiate gait and stair training next session in AM.   Recommendations for follow up therapy are one component of a multi-disciplinary discharge planning process, led by the attending physician.  Recommendations may be updated based on patient status, additional functional criteria and insurance authorization.  Follow Up Recommendations  Home health PT     Assistance Recommended at Discharge Intermittent Supervision/Assistance  Patient can return home with the following A little help with walking and/or transfers;A little help with bathing/dressing/bathroom;Help with stairs or ramp for entrance;Assist for transportation;Assistance with cooking/housework   Equipment Recommendations  None recommended by PT    Recommendations for Other Services       Precautions / Restrictions  Precautions Precautions: Fall;Knee Precaution Booklet Issued: Yes (comment) Precaution Comments: reviewed no pillow under knee, handout given to reinforce Required Braces or Orthoses: Knee Immobilizer - Left Knee Immobilizer - Left: On when out of bed or walking;Other (comment) (still with quad lag with SLR so continue to maintain with OOB mobility) Restrictions Weight Bearing Restrictions: Yes LLE Weight Bearing: Weight bearing as tolerated     Mobility  Bed Mobility Overal bed mobility: Needs Assistance             General bed mobility comments: received/remained in supine    Transfers                          Balance Overall balance assessment: Needs assistance                                          Cognition Arousal/Alertness: Awake/alert Behavior During Therapy: WFL for tasks assessed/performed Overall Cognitive Status: Within Functional Limits for tasks assessed                                 General Comments: pleasant, cooperative; defers OOB due to fatigue after RN recently assisted her back to bed but agreeable to bed-level instruction on HEP and precautions        Exercises Total Joint Exercises Ankle Circles/Pumps: AROM, 10 reps, Both, Supine Quad Sets: AROM, 5 reps, Left, Supine Heel Slides: AAROM, Both, 5 reps, Supine, AROM    General Comments  Pt instructed on iceman and repositioning in supine for pressure and edema relief.       Pertinent Vitals/Pain Pain Assessment Pain Assessment: 0-10 Pain Score: 8  Pain Location: L knee when RN recently transferred her back to bed from chair; also with heel slides and SLR exercises Pain Descriptors / Indicators: Grimacing, Guarding, Moaning, Sharp, Throbbing Pain Intervention(s): Limited activity within patient's tolerance, Monitored during session, Premedicated before session, Repositioned, Ice applied (instructed pt and family on use of iceman and to notify  staff when ice is melted)           PT Goals (current goals can now be found in the care plan section) Acute Rehab PT Goals Patient Stated Goal: to return to independent PT Goal Formulation: With patient Time For Goal Achievement: 07/21/22 Progress towards PT goals: Progressing toward goals (nausea and low Hgb limiting mobility)    Frequency    7X/week      PT Plan Current plan remains appropriate       AM-PAC PT "6 Clicks" Mobility   Outcome Measure  Help needed turning from your back to your side while in a flat bed without using bedrails?: A Little Help needed moving from lying on your back to sitting on the side of a flat bed without using bedrails?: A Little Help needed moving to and from a bed to a chair (including a wheelchair)?: A Little Help needed standing up from a chair using your arms (e.g., wheelchair or bedside chair)?: A Little Help needed to walk in hospital room?: Total Help needed climbing 3-5 steps with a railing? : Total 6 Click Score: 14    End of Session Equipment Utilized During Treatment: Gait belt;Left knee immobilizer Activity Tolerance: Patient tolerated treatment well Patient left: in chair Nurse Communication: Mobility status PT Visit Diagnosis: Difficulty in walking, not elsewhere classified (R26.2);Pain Pain - Right/Left: Left Pain - part of body: Knee     Time: 2376-2831 PT Time Calculation (min) (ACUTE ONLY): 27 min  Charges:  $Therapeutic Exercise: 8-22 mins $Therapeutic Activity: 8-22 mins                     Lennis Korb P., PTA Acute Rehabilitation Services Secure Chat Preferred 9a-5:30pm Office: [phone]    Kara Pacer [facility] 07/15/2022, 6:24 PM

## 2022-07-15 NOTE — Progress Notes (Signed)
Patient ID: Kristina Burns, female   DOB: 1945/08/12, 76 y.o.   MRN: [id number] The patient is awake this morning and comfortable.  She has a little bit of nausea but denies lightheadedness.  Surprisingly her hemoglobin was down this morning below 7.  There was not a lot of bleeding from surgery and her left knee is not significantly swollen today.  I did change the dressing and there was only scant bloody drainage.  She is able to flex and extend her foot and ankle.  I am not sure if the hemoglobin of 6.4 is an erroneous value.  Her preoperative hemoglobin was 11.3.  Her creatinine is stable at 0.68.  A transfusion is already been ordered by my partner who is on-call last night.  If she stays asymptomatic from the suppose it acute blood loss anemia, she can get up with therapy.  I will not be comfortable discharging her to home today based on lab value.  I talked at length about this and she is very comfortable.

## 2022-07-15 NOTE — Progress Notes (Addendum)
PT Cancellation Note  Patient Details Name: Kristina Burns MRN: [id number] DOB: 1946-07-05   Cancelled Treatment:    Reason Eval/Treat Not Completed: (P) Patient not medically ready (Held PT session in AM due to pt with low Hgb and still low on re-check of Hgb before lunch, plan for x2 units of blood. Pt with nausea in AM. Will see pt in PM)   Tamaka Sawin M Obryan Radu 07/15/2022, 5:30 PM* delayed entry

## 2022-07-16 ENCOUNTER — Encounter (HOSPITAL_COMMUNITY): Payer: Self-pay | Admitting: Orthopaedic Surgery

## 2022-07-16 LAB — CBC
HCT: 27.8 % — ABNORMAL LOW (ref 36.0–46.0)
Hemoglobin: 9.1 g/dL — ABNORMAL LOW (ref 12.0–15.0)
MCH: 28.5 pg (ref 26.0–34.0)
MCHC: 32.7 g/dL (ref 30.0–36.0)
MCV: 87.1 fL (ref 80.0–100.0)
Platelets: 222 10*3/uL (ref 150–400)
RBC: 3.19 MIL/uL — ABNORMAL LOW (ref 3.87–5.11)
RDW: 15.4 % (ref 11.5–15.5)
WBC: 8.6 10*3/uL (ref 4.0–10.5)
nRBC: 0 % (ref 0.0–0.2)

## 2022-07-16 LAB — BPAM RBC
Blood Product Expiration Date: 202312152359
Blood Product Expiration Date: 202312262359
ISSUE DATE / TIME: 202312081134
ISSUE DATE / TIME: 202312081520
Unit Type and Rh: 5100
Unit Type and Rh: 5100

## 2022-07-16 LAB — TYPE AND SCREEN
ABO/RH(D): O POS
Antibody Screen: NEGATIVE
Unit division: 0
Unit division: 0

## 2022-07-16 MED ORDER — OXYCODONE HCL 5 MG PO TABS: 5.0000 mg | ORAL_TABLET | Freq: Four times a day (QID) | ORAL | 0 refills | Status: DC | PRN

## 2022-07-16 MED ORDER — METHOCARBAMOL 500 MG PO TABS: 500.0000 mg | ORAL_TABLET | Freq: Four times a day (QID) | ORAL | 1 refills | Status: AC | PRN

## 2022-07-16 MED ORDER — APIXABAN 2.5 MG PO TABS: 2.5000 mg | ORAL_TABLET | Freq: Two times a day (BID) | ORAL | 0 refills | Status: AC

## 2022-07-16 NOTE — Discharge Summary (Signed)
Patient ID: Kristina Burns MRN: [id number] DOB/AGE: 74-Apr-1947 74 y.o.  Admit date: 07/14/2022 Discharge date: 07/16/2022  Admission Diagnoses:  Principal Problem:   Unilateral primary osteoarthritis, left knee Active Problems:   OA (osteoarthritis) of knee   Status post total left knee replacement   Discharge Diagnoses:  Same  Past Medical History:  Diagnosis Date   Arthritis    Asthma    High cholesterol     Surgeries: Procedure(s): LEFT TOTAL KNEE ARTHROPLASTY on 07/14/2022   Consultants:   Discharged Condition: Improved  Hospital Course: Kristina Burns is an 76 y.o. female who was admitted 07/14/2022 for operative treatment ofUnilateral primary osteoarthritis, left knee. Patient has severe unremitting pain that affects sleep, daily activities, and work/hobbies. After pre-op clearance the patient was taken to the operating room on 07/14/2022 and underwent  Procedure(s): LEFT TOTAL KNEE ARTHROPLASTY.    Patient was given perioperative antibiotics:  Anti-infectives (From admission, onward)    Start     Dose/Rate Route Frequency Ordered Stop   07/14/22 1400  ceFAZolin (ANCEF) IVPB 1 g/50 mL premix        1 g 100 mL/hr over 30 Minutes Intravenous Every 6 hours 07/14/22 1107 07/14/22 2041   07/14/22 1200  doxycycline (VIBRA-TABS) tablet 100 mg        100 mg Oral 2 times daily 07/14/22 1107     07/14/22 0626  ceFAZolin (ANCEF) 2-4 GM/100ML-% IVPB       Note to Pharmacy: Alba Cory B: cabinet override      07/14/22 0626 07/14/22 0800   07/14/22 0615  ceFAZolin (ANCEF) IVPB 2g/100 mL premix        2 g 200 mL/hr over 30 Minutes Intravenous On call to O.R. 07/14/22 0603 07/14/22 0759        Patient was given sequential compression devices, early ambulation, and chemoprophylaxis to prevent DVT.  Patient benefited maximally from hospital stay and there were no complications.  She did experience acute blood loss anemia after surgery and required a transfusion.  She  responded to this well with improvement and her hemoglobin.  Her vital signs were stable upon discharge and she cleared physical therapy.  Recent vital signs: Patient Vitals for the past 24 hrs:  BP Temp Temp src Pulse Resp SpO2  07/16/22 0821 125/71 98.2 F (36.8 C) Oral 79 18 99 %  07/16/22 0446 (!) 106/57 98.5 F (36.9 C) Oral 81 18 98 %  07/15/22 2105 (!) 99/45 98.4 F (36.9 C) Oral 87 17 99 %  07/15/22 1859 -- 98.2 F (36.8 C) Oral 79 17 100 %  07/15/22 1539 117/66 98 F (36.7 C) Oral 81 18 100 %  07/15/22 1517 110/69 98.3 F (36.8 C) Oral 85 17 100 %  07/15/22 1455 (!) 110/53 -- -- 66 -- --  07/15/22 1445 (!) 110/53 -- -- -- -- --  07/15/22 1430 (!) 103/55 98.3 F (36.8 C) Oral 75 17 100 %  07/15/22 1153 (!) 105/53 97.8 F (36.6 C) Tympanic 80 19 100 %  07/15/22 1128 (!) 105/53 97.8 F (36.6 C) -- 75 -- 100 %     Recent laboratory studies:  Recent Labs    07/15/22 0413 07/15/22 1007 07/15/22 2053 07/16/22 0226  WBC 11.0*   < > 10.1 8.6  HGB 6.4*   < > 9.4* 9.1*  HCT 19.7*   < > 28.5* 27.8*  PLT 242   < > 215 222  NA 136  --   --   --  K 4.1  --   --   --   CL 104  --   --   --   CO2 23  --   --   --   BUN 14  --   --   --   CREATININE 0.68  --   --   --   GLUCOSE 154*  --   --   --   CALCIUM 8.7*  --   --   --    < > = values in this interval not displayed.     Discharge Medications:   Allergies as of 07/16/2022       Reactions   Aspirin Swelling   Lips swell up.  She hasn't taken in awhile   Nabumetone Itching   cracked skin        Medication List     TAKE these medications    apixaban 2.5 MG Tabs tablet Commonly known as: Eliquis Take 1 tablet (2.5 mg total) by mouth 2 (two) times daily.   atorvastatin 10 MG tablet Commonly known as: LIPITOR Take 10 mg by mouth daily.   betamethasone dipropionate 0.05 % cream Apply 1 Application topically 2 (two) times daily as needed (itching).   doxycycline 100 MG tablet Commonly known as:  VIBRA-TABS Take 1 tablet (100 mg total) by mouth 2 (two) times daily.   methocarbamol 500 MG tablet Commonly known as: ROBAXIN Take 1 tablet (500 mg total) by mouth every 6 (six) hours as needed for muscle spasms.   mupirocin cream 2 % Commonly known as: BACTROBAN Apply 1 Application topically 2 (two) times daily. Apply to knee wounds daily What changed:  when to take this reasons to take this additional instructions   oxyCODONE 5 MG immediate release tablet Commonly known as: Oxy IR/ROXICODONE Take 1-2 tablets (5-10 mg total) by mouth every 6 (six) hours as needed for moderate pain (pain score 4-6).   Vitamin D 50 MCG (2000 UT) tablet Take 2,000 Units by mouth daily.               Durable Medical Equipment  (From admission, onward)           Start     Ordered   07/14/22 1108  DME 3 n 1  Once        07/14/22 1107   07/14/22 1108  DME Walker rolling  Once       Question Answer Comment  Walker: With 5 Inch Wheels   Patient needs a walker to treat with the following condition Status post total left knee replacement      07/14/22 1107            Diagnostic Studies: DG Knee Left Port  Result Date: 07/14/2022 CLINICAL DATA:  Status post total left knee arthroplasty. EXAM: PORTABLE LEFT KNEE - 1-2 VIEW COMPARISON:  Left knee radiographs 05/30/2022 FINDINGS: Interval total left knee arthroplasty. No perihardware lucency is seen to indicate hardware failure or loosening. Expected postoperative changes including subcutaneous air and mild intra-articular air. Tiny joint effusion. Anterior surgical skin staples. No acute fracture or dislocation. IMPRESSION: Interval total left knee arthroplasty without evidence of hardware failure. Electronically Signed   By: Yvonne Kendall M.D.   On: 07/14/2022 10:23    Disposition: Discharge disposition: 01-Home or Self Care          Follow-up Information     Tisovec, Fransico Him, MD Follow up.   Specialty: Internal  Medicine Contact information: [address]  [place] [address] [phone]         [facility] Follow up.   Specialty: Home Health Services Why: Your home health services will be provided by [facility] information: [address] [place] [address] [phone]         Mcarthur Rossetti, MD Follow up in 2 week(s).   Specialty: Orthopedic Surgery Contact information: [address] [place] [address] [phone]                  Signed: Mcarthur Rossetti 07/16/2022, 10:41 AM

## 2022-07-16 NOTE — Progress Notes (Signed)
Patient ID: Kristina Burns, female   DOB: 08-20-1945, 76 y.o.   MRN: [id number] [name] looks good this morning.  Has been up with therapy.  Mobilizing well.  Her vitals are stable.  Hgb is up to 9.1 from 6.4 post transfusion.  Can be discharged to home today.

## 2022-07-16 NOTE — Progress Notes (Signed)
Physical Therapy Treatment Patient Details Name: Kristina Burns MRN: [id number] DOB: Feb 16, 1946 Today's Date: 07/16/2022   History of Present Illness Patient is a 76 y/o female with PMH asthma, arthritis, high cholesterol and R TKA 2020.  She is now s/p L TKA due to osteoarthritis and pain.    PT Comments    Pt received in recliner, eager to progress functional mobility and with good participation and tolerance this session for transfer, gait and stair training. Pt with moderate pain and still with L quad lag so kept KI donned throughout for safety. Pt needing up to minA (+2 safety) for stair ascent and min guard to minA for gait. Chair follow provided for safety as pt fatigued prior to reaching her room after performing x4 steps in PT gym. Pt continues to benefit from PT services to progress toward functional mobility goals, pt hoping to DC after this session as long as she is medically cleared, anticipate she will progress well with assist from family initially for transfers, gait and stairs.    Recommendations for follow up therapy are one component of a multi-disciplinary discharge planning process, led by the attending physician.  Recommendations may be updated based on patient status, additional functional criteria and insurance authorization.  Follow Up Recommendations  Home health PT     Assistance Recommended at Discharge Intermittent Supervision/Assistance  Patient can return home with the following A little help with walking and/or transfers;A little help with bathing/dressing/bathroom;Help with stairs or ramp for entrance;Assist for transportation;Assistance with cooking/housework   Equipment Recommendations  None recommended by PT    Recommendations for Other Services       Precautions / Restrictions Precautions Precautions: Fall;Knee Precaution Booklet Issued: Yes (comment) Precaution Comments: reviewed no pillow under knee, handout given to reinforce Required Braces or  Orthoses: Knee Immobilizer - Left Knee Immobilizer - Left: On when out of bed or walking;Other (comment) (still with quad lag with SLR so continue to maintain with OOB mobility) Restrictions Weight Bearing Restrictions: Yes LLE Weight Bearing: Weight bearing as tolerated     Mobility  Bed Mobility Overal bed mobility: Needs Assistance             General bed mobility comments: received/remained in recliner chair    Transfers Overall transfer level: Needs assistance Equipment used: Rolling walker (2 wheels) Transfers: Sit to/from Stand Sit to Stand: Min assist, Min guard           General transfer comment: for balance and cues for hand placement and LLE placement    Ambulation/Gait Ambulation/Gait assistance: Min guard Gait Distance (Feet): 50 Feet (76f, seated break, 566f Assistive device: Rolling walker (2 wheels) Gait Pattern/deviations: Step-to pattern, Decreased weight shift to left, Antalgic Gait velocity: grossly <0.3 m/s     General Gait Details: KI donned throughout due to continued quad lag with SLR, pt demos step-to pattern with walker and cues for technique, needs frequent reminders for RW proximity and posture; distance limited due to pain/fatigue and pt wanting to save energy for DC home.   Stairs Stairs: Yes Stairs assistance: Min assist, +2 safety/equipment Stair Management: Two rails, Step to pattern, Forwards Number of Stairs: 4 General stair comments: pt ascended/descended 2 steps in PT gym x2 trials with B rails, cues for step sequencing and pt anxious, needing manual assist initially to raise RLE but on second attempt, able to raise RLE onto step without PTA assist; family member present assisting with gait belt.   Wheelchair Mobility    Modified Rankin (  Stroke Patients Only)       Balance Overall balance assessment: Needs assistance Sitting-balance support: Feet supported Sitting balance-Leahy Scale: Normal     Standing balance  support: Bilateral upper extremity supported Standing balance-Leahy Scale: Poor Standing balance comment: UE support (RW) for balance                            Cognition Arousal/Alertness: Awake/alert Behavior During Therapy: WFL for tasks assessed/performed Overall Cognitive Status: Within Functional Limits for tasks assessed                                 General Comments: pleasant, cooperative; family present and receptive to instruction on guarding techniques        Exercises Total Joint Exercises Ankle Circles/Pumps: AROM, 10 reps, Both, Supine Quad Sets: AROM, 5 reps, Left, Supine Heel Slides: AAROM, Both, 5 reps, Supine, AROM Other Exercises Other Exercises: pt reports she performed LLE HEP exercises from handout prior to session    General Comments General comments (skin integrity, edema, etc.): VSS on RA per chart review, pt denies dizziness throughout; TED hose donned      Pertinent Vitals/Pain Pain Assessment Pain Assessment: 0-10 Pain Score: 7  Pain Location: with knee flexion and transfers Pain Descriptors / Indicators: Grimacing, Guarding, Moaning, Sharp, Throbbing Pain Intervention(s): Limited activity within patient's tolerance, Monitored during session, Premedicated before session, Repositioned, Other (comment) (pt defers ice)    Home Living                          Prior Function            PT Goals (current goals can now be found in the care plan section) Acute Rehab PT Goals Patient Stated Goal: to return to independent PT Goal Formulation: With patient Time For Goal Achievement: 07/21/22 Progress towards PT goals: Progressing toward goals    Frequency    7X/week      PT Plan Current plan remains appropriate    Co-evaluation              AM-PAC PT "6 Clicks" Mobility   Outcome Measure  Help needed turning from your back to your side while in a flat bed without using bedrails?: A Little Help  needed moving from lying on your back to sitting on the side of a flat bed without using bedrails?: A Little Help needed moving to and from a bed to a chair (including a wheelchair)?: A Little Help needed standing up from a chair using your arms (e.g., wheelchair or bedside chair)?: A Little Help needed to walk in hospital room?: A Little Help needed climbing 3-5 steps with a railing? : A Lot (mod cues, +2 safety) 6 Click Score: 17    End of Session Equipment Utilized During Treatment: Gait belt;Left knee immobilizer Activity Tolerance: Patient tolerated treatment well Patient left: in chair;with call bell/phone within reach;with family/visitor present Nurse Communication: Mobility status;Other (comment) (pt ready for DC once medically cleared) PT Visit Diagnosis: Difficulty in walking, not elsewhere classified (R26.2);Pain Pain - Right/Left: Left Pain - part of body: Knee     Time: 6967-8938 PT Time Calculation (min) (ACUTE ONLY): 39 min  Charges:  $Gait Training: 23-37 mins $Therapeutic Activity: 8-22 mins  Houston Siren., PTA Acute Rehabilitation Services Secure Chat Preferred 9a-5:30pm Office: [place] 07/16/2022, 12:53 PM

## 2022-07-18 DIAGNOSIS — Z792 Long term (current) use of antibiotics: Secondary | ICD-10-CM | POA: Diagnosis not present

## 2022-07-18 DIAGNOSIS — Z7901 Long term (current) use of anticoagulants: Secondary | ICD-10-CM | POA: Diagnosis not present

## 2022-07-18 DIAGNOSIS — Z96652 Presence of left artificial knee joint: Secondary | ICD-10-CM | POA: Diagnosis not present

## 2022-07-18 DIAGNOSIS — G8929 Other chronic pain: Secondary | ICD-10-CM | POA: Diagnosis not present

## 2022-07-18 DIAGNOSIS — Z471 Aftercare following joint replacement surgery: Secondary | ICD-10-CM | POA: Diagnosis not present

## 2022-07-18 DIAGNOSIS — Z9181 History of falling: Secondary | ICD-10-CM | POA: Diagnosis not present

## 2022-07-18 DIAGNOSIS — J45909 Unspecified asthma, uncomplicated: Secondary | ICD-10-CM | POA: Diagnosis not present

## 2022-07-18 DIAGNOSIS — E7800 Pure hypercholesterolemia, unspecified: Secondary | ICD-10-CM | POA: Diagnosis not present

## 2022-07-18 DIAGNOSIS — Z96651 Presence of right artificial knee joint: Secondary | ICD-10-CM | POA: Diagnosis not present

## 2022-07-19 ENCOUNTER — Telehealth: Payer: Self-pay

## 2022-07-19 ENCOUNTER — Telehealth: Payer: Self-pay | Admitting: Orthopaedic Surgery

## 2022-07-19 NOTE — Telephone Encounter (Signed)
Patient aware of the below message  

## 2022-07-19 NOTE — Telephone Encounter (Signed)
Patient states she is having issues and want someone to call and speak to her .Kristina KitchenPlease Advise..[phone]. Sending call to Triage Nurse.

## 2022-07-19 NOTE — Telephone Encounter (Signed)
Patient called stating she is having trouble swallowing since surgery? What do I need to tell her to do?

## 2022-07-19 NOTE — Telephone Encounter (Signed)
LM with Ninfa Linden for this patient. Another message has been sent

## 2022-07-23 ENCOUNTER — Other Ambulatory Visit: Payer: Self-pay | Admitting: Orthopaedic Surgery

## 2022-07-28 ENCOUNTER — Ambulatory Visit (INDEPENDENT_AMBULATORY_CARE_PROVIDER_SITE_OTHER): Payer: Medicare Other | Admitting: Physician Assistant

## 2022-07-28 ENCOUNTER — Encounter: Payer: Self-pay | Admitting: Physician Assistant

## 2022-07-28 DIAGNOSIS — Z96652 Presence of left artificial knee joint: Secondary | ICD-10-CM

## 2022-07-28 MED ORDER — OXYCODONE HCL 5 MG PO TABS: 5.0000 mg | ORAL_TABLET | Freq: Four times a day (QID) | ORAL | 0 refills | Status: AC | PRN

## 2022-07-28 NOTE — Progress Notes (Signed)
HPI:Kristina Burns returns today status post left total knee arthroplasty 07/14/2022.  She states that overall she is doing well.  Still having some moderate pain in the knee.  She is asking for refill on her oxycodone.  She had stopped her Eliquis that she was on postop for DVT prophylaxis.  She was unable to take aspirin.  She has had no shortness of breath fevers chills.   Review of systems: See HPI otherwise negative  Physical exam: General well-developed well-nourished female no acute distress. Left knee: Full extension flexion to 85 degrees.  Calf supple nontender.  Dorsiflexion plantarflexion left ankle intact.  Ambulating with walker.  Surgical incisions healing well no signs of infection wound dehiscence.  Impression: Status post left total knee arthroplasty. Plan: Set her up for outpatient physical therapy she will work on scar tissue mobilization.  Follow-up with [place] in 1 month sooner if there is any questions or concerns.

## 2022-07-29 NOTE — Addendum Note (Signed)
Addended by: Robyne Peers on: 07/29/2022 08:47 AM   Modules accepted: Orders

## 2022-08-05 ENCOUNTER — Telehealth: Payer: Self-pay | Admitting: Orthopaedic Surgery

## 2022-08-05 NOTE — Telephone Encounter (Signed)
FYI

## 2022-08-05 NOTE — Telephone Encounter (Signed)
Patient is being discharged from Home health PT today

## 2022-08-16 NOTE — Therapy (Addendum)
OUTPATIENT PHYSICAL THERAPY LOWER EXTREMITY EVALUATION   Patient Name: Kristina Burns MRN: [id number] DOB:[birth year], 77 y.o., female Today's Date: 08/17/2022  END OF SESSION:  PT End of Session - 08/17/22 0853     Visit Number 1    Date for PT Re-Evaluation 10/12/22    Authorization Type BCBS MCR    Progress Note Due on Visit 10    PT Start Time [phone]    PT Stop Time 0930    PT Time Calculation (min) 39 min    Activity Tolerance Patient tolerated treatment well    Behavior During Therapy WFL for tasks assessed/performed             Past Medical History:  Diagnosis Date   Arthritis    Asthma    High cholesterol    Past Surgical History:  Procedure Laterality Date   ABDOMINAL HYSTERECTOMY     BACK SURGERY     TOTAL KNEE ARTHROPLASTY Right 08/21/2018   Procedure: RIGHT TOTAL KNEE ARTHROPLASTY;  Surgeon: Mcarthur Rossetti, MD;  Location: [facility];  Service: Orthopedics;  Laterality: Right;   TOTAL KNEE ARTHROPLASTY Left 07/14/2022   Procedure: LEFT TOTAL KNEE ARTHROPLASTY;  Surgeon: Mcarthur Rossetti, MD;  Location: [facility];  Service: Orthopedics;  Laterality: Left;   Patient Active Problem List   Diagnosis Date Noted   OA (osteoarthritis) of knee 07/14/2022   Status post total left knee replacement 07/14/2022   Status post total knee replacement, right 08/21/2018   Chronic pain of both knees 07/10/2017   Unilateral primary osteoarthritis, left knee 07/10/2017    PCP: Tisovec, Fransico Him, MD   REFERRING PROVIDER: Pete Pelt, PA-C   REFERRING DIAG: [id number] (ICD-10-CM) - Status post total left knee replacement   THERAPY DIAG:  Stiffness of left knee, not elsewhere classified  Acute pain of left knee  Muscle weakness (generalized)  Rationale for Evaluation and Treatment: Rehabilitation  ONSET DATE: 07/14/22  SUBJECTIVE:   SUBJECTIVE STATEMENT: Walking with SPC in community and sometimes without at home. HHPT until 08/05/22  PERTINENT  HISTORY: R TKR, back surgery, asthma PAIN:  Are you having pain? Yes: NPRS scale: 2/10 Pain location: left knee Pain description: achy, sharp Aggravating factors: bending Relieving factors: rest  PRECAUTIONS: None  WEIGHT BEARING RESTRICTIONS: No  FALLS:  Has patient fallen in last 6 months? No  LIVING ENVIRONMENT: Lives with: lives with their spouse Lives in: House/apartment Stairs: Yes: External: 2-3 steps; can reach both Has following equipment at home: Single point cane and Walker - 2 wheeled  OCCUPATION: retired  PLOF: Independent  PATIENT GOALS: get back to normal, wants to drive  NEXT MD VISIT: 08/18/22  OBJECTIVE:   DIAGNOSTIC FINDINGS: N/A  PATIENT SURVEYS:  LEFS 42 / 80 = 52.5 %  COGNITION: Overall cognitive status: Within functional limits for tasks assessed     SENSATION: WFL  EDEMA:  Circumferential: R  45.5  cm;  L 49 cm  MUSCLE LENGTH: HS: mild tightness Quads:marked bil ITB: NT Piriformis: NT Hip Flexors:NT Heelcords: WFL   POSTURE: flexed trunk   PALPATION: Palpation: TTP at medial knee.   Patellar Mobility: mild decrease sup/inf     LOWER EXTREMITY ROM:   ROM Right  eval Left Active/Passive eval  Knee flexion 101 65/75  Knee extension 0 0   (Blank rows = not tested)  LOWER EXTREMITY MMT:  MMT Right eval Left eval  Hip flexion 4 4  Hip extension  4+  Hip abduction  4+  Hip adduction  4  Hip internal rotation    Hip external rotation    Knee flexion 5 4+ in available range  Knee extension 5 4+  Ankle dorsiflexion 5 4+   (Blank rows = not tested)   FUNCTIONAL TESTS:  5 times sit to stand: 24.87 sec with bil UE support Timed up and go (TUG): 27.08 no AD  GAIT: Distance walked: 60 Assistive device utilized: Single point cane Level of assistance: Modified independence Comments: decreased step length and heel strike left   TODAY'S TREATMENT:                                                                                                                               DATE:   08/17/22 See pt ed  PATIENT EDUCATION:  Education details: PT eval findings, anticipated POC, HEP update, and HEP review  Person educated: Patient Education method: Explanation, Demonstration, and Handouts Education comprehension: verbalized understanding and returned demonstration  HOME EXERCISE PROGRAM: Access Code: [email or web address] URL: [email or web address] Date: 08/17/2022 Prepared by: Almyra Free  Exercises - Sit to Stand  - 3 x daily - 7 x weekly - 1-2 sets - 10 reps - Seated Long Arc Quad  - 2 x daily - 7 x weekly - 3 sets - 10 reps - 5 sec hold - Seated Knee Flexion Stretch  - 3 x daily - 7 x weekly - 2 sets - 10 reps - max hold - Seated Knee Flexion AAROM  - 3 x daily - 7 x weekly - 2 sets - 10 reps - max hold  ASSESSMENT:  CLINICAL IMPRESSION: Patient is a 77 y.o. female who was seen today for physical therapy evaluation and treatment for L knee pain and stiffness s/p L TKR on 07/14/22. Pt has expected deficits in L knee flexion, gait and flexibility limiting ADLs. She also has LE strength deficits and localized edema in the L LE. She will benefit from skilled PT to address these deficits.   OBJECTIVE IMPAIRMENTS: Abnormal gait, decreased activity tolerance, decreased ROM, decreased strength, increased edema, increased muscle spasms, impaired flexibility, postural dysfunction, and pain.   ACTIVITY LIMITATIONS: sitting, standing, squatting, dressing, and locomotion level  PARTICIPATION LIMITATIONS: driving  PERSONAL FACTORS: 1-2 comorbidities: R TKR, back surgery  are also affecting patient's functional outcome.   REHAB POTENTIAL: Excellent  CLINICAL DECISION MAKING: Stable/uncomplicated  EVALUATION COMPLEXITY: Low  GOALS: Goals reviewed with patient? Yes   SHORT TERM GOALS: Target date: 08/31/2022  (Remove Blue Hyperlink)  Independent with initial HEP. Baseline:  Goal status:  INITIAL    LONG TERM GOALS: Target date: 10/12/2022 (Remove Blue Hyperlink)  Independent with advanced/ongoing HEP to improve outcomes and carryover.  Baseline:  Goal status: INITIAL  2.  Kristina Burns will demonstrate L knee flexion to 115 deg to ascend/descend stairs. Baseline:  Goal status: INITIAL  3.    Kristina Burns will be able to ambulate 600' safely with LRAD and normal gait pattern  to access community.  Baseline:  Goal status: INITIAL  4.  Kristina Burns will be able to ascend/descend 3 stairs with 1 HR and reciprocal step pattern safely to access home and community.  Baseline:  Goal status: INITIAL  5.  Kristina Burns will demonstrate < = 25 sec on TUG to demonstrate decreased risk of falls.   Baseline: 27.08 sec with no AD Goal status: INITIAL  6.  Kristina Burns will demonstrate improved functional LE strength by completing 5x STS in < 19  seconds.  (MCID 5 seconds) Baseline: 24.87 sec with bil UE support Goal status: INITIAL  7.  Kristina Burns will report 51/80 on LEFS to demonstrate improved function.   Baseline: 42 / 80 = 52.5 % Goal status: INITIAL       PLAN:  PT FREQUENCY: 2x/week  PT DURATION: 8 weeks  PLANNED INTERVENTIONS: Therapeutic exercises, Therapeutic activity, Neuromuscular re-education, Balance training, Gait training, Patient/Family education, Self Care, Joint mobilization, Stair training, Aquatic Therapy, Dry Needling, Electrical stimulation, Cryotherapy, Moist heat, scar mobilization, Taping, Vasopneumatic device, and Manual therapy  PLAN FOR NEXT SESSION: Focus on L knee flexion, modalities prn   Tredarius Cobern, PT 08/17/2022, 12:15 PM

## 2022-08-17 ENCOUNTER — Encounter: Payer: Self-pay | Admitting: Physical Therapy

## 2022-08-17 ENCOUNTER — Other Ambulatory Visit: Payer: Self-pay

## 2022-08-17 ENCOUNTER — Ambulatory Visit: Payer: Medicare Other | Attending: Physician Assistant | Admitting: Physical Therapy

## 2022-08-17 DIAGNOSIS — Z96652 Presence of left artificial knee joint: Secondary | ICD-10-CM | POA: Diagnosis not present

## 2022-08-17 DIAGNOSIS — M25562 Pain in left knee: Secondary | ICD-10-CM | POA: Diagnosis not present

## 2022-08-17 DIAGNOSIS — M6281 Muscle weakness (generalized): Secondary | ICD-10-CM | POA: Diagnosis not present

## 2022-08-17 DIAGNOSIS — M25662 Stiffness of left knee, not elsewhere classified: Secondary | ICD-10-CM | POA: Diagnosis not present

## 2022-08-18 ENCOUNTER — Ambulatory Visit (INDEPENDENT_AMBULATORY_CARE_PROVIDER_SITE_OTHER): Payer: Medicare Other | Admitting: Physician Assistant

## 2022-08-18 ENCOUNTER — Encounter: Payer: Self-pay | Admitting: Physician Assistant

## 2022-08-18 DIAGNOSIS — Z96652 Presence of left artificial knee joint: Secondary | ICD-10-CM

## 2022-08-18 NOTE — Progress Notes (Signed)
HPI: Kristina Burns comes in today status post left total knee arthroplasty 07/14/2022.  She states overall she is doing well.  She has been working on her range of motion.  She starts outpatient physical therapy tomorrow.  She is using a cane to ambulate.  She is only taking oxycodone as needed.  Physical exam: General well-developed well-nourished female no acute distress.  Mood and affect appropriate Left knee: Full extension flexion to 85 degrees.  No instability valgus varus stressing.  Calf supple nontender dorsiflexion plantarflexion ankle intact.  Surgical incisions well-healed  Impression: Status post left total knee arthroplasty 07/14/2022  Plan: Will have her follow-up with [place] in 2 weeks see what her range of motion is like.  She is not bending past 90 most likely will set her up for a left knee manipulation under anesthesia.  Questions were encouraged and answered by Dr. Delilah Shan and myself.

## 2022-08-19 ENCOUNTER — Ambulatory Visit: Payer: Medicare Other | Admitting: Physical Therapy

## 2022-08-19 ENCOUNTER — Encounter: Payer: Self-pay | Admitting: Physical Therapy

## 2022-08-19 DIAGNOSIS — M6281 Muscle weakness (generalized): Secondary | ICD-10-CM

## 2022-08-19 DIAGNOSIS — M25662 Stiffness of left knee, not elsewhere classified: Secondary | ICD-10-CM

## 2022-08-19 DIAGNOSIS — Z96652 Presence of left artificial knee joint: Secondary | ICD-10-CM | POA: Diagnosis not present

## 2022-08-19 DIAGNOSIS — M25562 Pain in left knee: Secondary | ICD-10-CM | POA: Diagnosis not present

## 2022-08-19 NOTE — Therapy (Addendum)
OUTPATIENT PHYSICAL THERAPY LOWER EXTREMITY TREATMENT   Patient Name: Kristina Burns MRN: [id number] DOB:1945-09-05, 77 y.o., female Today's Date: 08/19/2022  END OF SESSION:  PT End of Session - 08/19/22 0814     Visit Number 2    Date for PT Re-Evaluation 10/12/22    Authorization Type BCBS MCR    PT Start Time 0808    PT Stop Time 0849    PT Time Calculation (min) 41 min    Activity Tolerance Patient tolerated treatment well    Behavior During Therapy WFL for tasks assessed/performed              Past Medical History:  Diagnosis Date   Arthritis    Asthma    High cholesterol    Past Surgical History:  Procedure Laterality Date   ABDOMINAL HYSTERECTOMY     BACK SURGERY     TOTAL KNEE ARTHROPLASTY Right 08/21/2018   Procedure: RIGHT TOTAL KNEE ARTHROPLASTY;  Surgeon: Mcarthur Rossetti, MD;  Location: [place];  Service: Orthopedics;  Laterality: Right;   TOTAL KNEE ARTHROPLASTY Left 07/14/2022   Procedure: LEFT TOTAL KNEE ARTHROPLASTY;  Surgeon: Mcarthur Rossetti, MD;  Location: [place];  Service: Orthopedics;  Laterality: Left;   Patient Active Problem List   Diagnosis Date Noted   OA (osteoarthritis) of knee 07/14/2022   Status post total left knee replacement 07/14/2022   Status post total knee replacement, right 08/21/2018   Chronic pain of both knees 07/10/2017   Unilateral primary osteoarthritis, left knee 07/10/2017    PCP: Tisovec, Fransico Him, MD  REFERRING PROVIDER: Pete Pelt, PA-C  REFERRING DIAG: [phone] (ICD-10-CM) - Status post total left knee replacement   THERAPY DIAG:  Stiffness of left knee, not elsewhere classified  Acute pain of left knee  Muscle weakness (generalized)  RATIONALE FOR EVALUATION AND TREATMENT: Rehabilitation  ONSET DATE: 07/14/22  NEXT MD VISIT: 09/01/22   SUBJECTIVE:   SUBJECTIVE STATEMENT: Saw MD yesterday and told her to work on the bending of the L knee.   PAIN:  Are you having pain? Yes:  NPRS scale: 4/10 Pain location: left knee Pain description: achy Aggravating factors: bending Relieving factors: rest  PERTINENT HISTORY: R TKR, back surgery, asthma  PRECAUTIONS: None  WEIGHT BEARING RESTRICTIONS: No  FALLS:  Has patient fallen in last 6 months? No  LIVING ENVIRONMENT: Lives with: lives with their spouse Lives in: House/apartment Stairs: Yes: External: 2-3 steps; can reach both Has following equipment at home: Single point cane and Walker - 2 wheeled  OCCUPATION: retired  PLOF: Independent  PATIENT GOALS: get back to normal, wants to drive    OBJECTIVE:   DIAGNOSTIC FINDINGS: N/A  PATIENT SURVEYS:  LEFS 42 / 80 = 52.5 %  COGNITION: Overall cognitive status: Within functional limits for tasks assessed     SENSATION: WFL  EDEMA:  Circumferential: R  45.5  cm;  L 49 cm  MUSCLE LENGTH: HS: mild tightness Quads:marked bil ITB: NT Piriformis: NT Hip Flexors:NT Heelcords: WFL  POSTURE: flexed trunk   PALPATION: Palpation: TTP at medial knee.   Patellar Mobility: mild decrease sup/inf  LOWER EXTREMITY ROM:   ROM Right  eval Left A/PROM eval  Knee flexion 101 65/75  Knee extension 0 0   (Blank rows = not tested)  LOWER EXTREMITY MMT:  MMT Right eval Left eval  Hip flexion 4 4  Hip extension  4+  Hip abduction  4+  Hip adduction  4  Hip internal rotation  Hip external rotation    Knee flexion 5 4+ in available range  Knee extension 5 4+  Ankle dorsiflexion 5 4+   (Blank rows = not tested)  FUNCTIONAL TESTS:  5 times sit to stand: 24.87 sec with bil UE support Timed up and go (TUG): 27.08 no AD  GAIT: Distance walked: 60 Assistive device utilized: Single point cane Level of assistance: Modified independence Comments: decreased step length and heel strike left   TODAY'S TREATMENT:                                                                                                                              DATE:     08/19/22 THERAPEUTIC EXERCISE: to improve flexibility, strength and mobility.  Verbal and tactile cues throughout for technique.  NuStep x6' L4 Standing knee flexion stretch with foot on stool 1x10 5" hold  Seated knee slides 2x10  Fitter leg press  2x10 (1 blue, 1 black)  Modified Thomas Stretch 3x30" hold  Peanut DKTC with strap for assistance 2x10 3" hold   MANUAL THERAPY: To promote improved joint mobility. Instructed on how to properly perform scar massages.  Patellar mobs 4-way    08/17/22 See pt ed  PATIENT EDUCATION:   Education details: HEP update, HEP review, and scar tissue mobilization Person educated: Patient Education method: Explanation, Demonstration, and Handouts Education comprehension: verbalized understanding and returned demonstration  HOME EXERCISE PROGRAM: Access Code: [email or web address] URL: [email or web address] Date: 08/25/2022 Prepared by: Annie Paras  Exercises - Sit to Stand  - 3 x daily - 7 x weekly - 1-2 sets - 10 reps - Seated Long Arc Quad  - 2 x daily - 7 x weekly - 3 sets - 10 reps - 5 sec hold - Seated Knee Flexion Stretch  - 3 x daily - 7 x weekly - 2 sets - 10 reps - max hold - Seated Knee Flexion AAROM  - 3 x daily - 7 x weekly - 2 sets - 10 reps - max hold - Supine Quadriceps Stretch with Strap on Table  - 2-3 x daily - 7 x weekly - 3 reps - 30 sec hold - Supine Heel Slide with Strap  - 2-3 x daily - 7 x weekly - 2 sets - 10 reps - 3 sec hold - Long Sitting Inferior Patellar Glide  - 2 x daily - 7 x weekly - 2 sets - 10 reps - 3 sec hold - Long Sitting Superior Patellar Glide  - 2 x daily - 7 x weekly - 2 sets - 10 reps - 3 sec hold - Long Sitting Medial Patellar Glide  - 2 x daily - 7 x weekly - 2 sets - 10 reps - 3 sec hold - Long Sitting Lateral Patellar Glide  - 2 x daily - 7 x weekly - 2 sets - 10 reps - 3 sec hold  Patient Education - Scar Massage  ASSESSMENT:  CLINICAL IMPRESSION:  Pt is very motivated to  get better  and improve in her knee flexion ROM to avoid MUA. Pt responded well to the new knee exercises and stretches provided to improve knee flexibility. PT provided knee joint mobilization and scar massages to facilitate joint movement and increase overall function.   OBJECTIVE IMPAIRMENTS: Abnormal gait, decreased activity tolerance, decreased ROM, decreased strength, increased edema, increased muscle spasms, impaired flexibility, postural dysfunction, and pain.   ACTIVITY LIMITATIONS: sitting, standing, squatting, dressing, and locomotion level  PARTICIPATION LIMITATIONS: driving  PERSONAL FACTORS: 1-2 comorbidities: R TKR, back surgery  are also affecting patient's functional outcome.   REHAB POTENTIAL: Excellent  CLINICAL DECISION MAKING: Stable/uncomplicated  EVALUATION COMPLEXITY: Low  GOALS: Goals reviewed with patient? Yes   SHORT TERM GOALS: Target date: 08/31/2022    Independent with initial HEP. Baseline:  Goal status: IN PROGRESS  LONG TERM GOALS: Target date: 10/12/2022   Independent with advanced/ongoing HEP to improve outcomes and carryover.  Baseline:  Goal status: IN PROGRESS  2.  Javayah Footman will demonstrate L knee flexion to 115 deg to ascend/descend stairs. Baseline:  Goal status: IN PROGRESS  3.    Zanyah Corle will be able to ambulate 600' safely with LRAD and normal gait pattern to access community.  Baseline:  Goal status: IN PROGRESS  4.  Rodneisha Doerr will be able to ascend/descend 3 stairs with 1 HR and reciprocal step pattern safely to access home and community.  Baseline:  Goal status: IN PROGRESS  5.  Darien Kuntzman will demonstrate < = 25 sec on TUG to demonstrate decreased risk of falls.   Baseline: 27.08 sec with no AD Goal status: IN PROGRESS  6.  Lissete Hackbart will demonstrate improved functional LE strength by completing 5x STS in < 19  seconds.  (MCID 5 seconds) Baseline: 24.87 sec with bil UE support Goal status: IN  PROGRESS  7.  Tika Feeny will report 51/80 on LEFS to demonstrate improved function.   Baseline: 42 / 80 = 52.5 % Goal status: IN PROGRESS   PLAN:  PT FREQUENCY: 2x/week  PT DURATION: 8 weeks  PLANNED INTERVENTIONS: Therapeutic exercises, Therapeutic activity, Neuromuscular re-education, Balance training, Gait training, Patient/Family education, Self Care, Joint mobilization, Stair training, Aquatic Therapy, Dry Needling, Electrical stimulation, Cryotherapy, Moist heat, scar mobilization, Taping, Vasopneumatic device, and Manual therapy  PLAN FOR NEXT SESSION: Focus on L knee flexion and increase knee flexibility. Work on patellar mobs and scar tissue massage to help facilitate increase in ROM.    Ezio Wieck Romero-Perozo, Student-PT 08/19/2022, 9:03 AM

## 2022-08-21 NOTE — Therapy (Signed)
OUTPATIENT PHYSICAL THERAPY LOWER EXTREMITY TREATMENT   Patient Name: Kristina Burns MRN: [id number] DOB:12-15-1945, 77 y.o., female Today's Date: 08/22/2022  END OF SESSION:  PT End of Session - 08/22/22 0852     Visit Number 3    Date for PT Re-Evaluation 10/12/22    Authorization Type BCBS MCR    Progress Note Due on Visit 10    PT Start Time 0848    PT Stop Time 0932    PT Time Calculation (min) 44 min    Activity Tolerance Patient tolerated treatment well    Behavior During Therapy WFL for tasks assessed/performed              Past Medical History:  Diagnosis Date   Arthritis    Asthma    High cholesterol    Past Surgical History:  Procedure Laterality Date   ABDOMINAL HYSTERECTOMY     BACK SURGERY     TOTAL KNEE ARTHROPLASTY Right 08/21/2018   Procedure: RIGHT TOTAL KNEE ARTHROPLASTY;  Surgeon: Mcarthur Rossetti, MD;  Location: [place];  Service: Orthopedics;  Laterality: Right;   TOTAL KNEE ARTHROPLASTY Left 07/14/2022   Procedure: LEFT TOTAL KNEE ARTHROPLASTY;  Surgeon: Mcarthur Rossetti, MD;  Location: [place];  Service: Orthopedics;  Laterality: Left;   Patient Active Problem List   Diagnosis Date Noted   OA (osteoarthritis) of knee 07/14/2022   Status post total left knee replacement 07/14/2022   Status post total knee replacement, right 08/21/2018   Chronic pain of both knees 07/10/2017   Unilateral primary osteoarthritis, left knee 07/10/2017    PCP: Tisovec, Fransico Him, MD  REFERRING PROVIDER: Pete Pelt, PA-C  REFERRING DIAG: [phone] (ICD-10-CM) - Status post total left knee replacement   THERAPY DIAG:  Stiffness of left knee, not elsewhere classified  Acute pain of left knee  Muscle weakness (generalized)  RATIONALE FOR EVALUATION AND TREATMENT: Rehabilitation  ONSET DATE: 07/14/22  NEXT MD VISIT: 09/01/22   SUBJECTIVE:   SUBJECTIVE STATEMENT: Patient reports compliance with HEP  PAIN:  Are you having pain? Yes:  NPRS scale: 2/10 Pain location: left knee Pain description: achy Aggravating factors: bending Relieving factors: rest  PERTINENT HISTORY: R TKR, back surgery, asthma  PRECAUTIONS: None  WEIGHT BEARING RESTRICTIONS: No  FALLS:  Has patient fallen in last 6 months? No  LIVING ENVIRONMENT: Lives with: lives with their spouse Lives in: House/apartment Stairs: Yes: External: 2-3 steps; can reach both Has following equipment at home: Single point cane and Walker - 2 wheeled  OCCUPATION: retired  PLOF: Independent  PATIENT GOALS: get back to normal, wants to drive    OBJECTIVE:   DIAGNOSTIC FINDINGS: N/A  PATIENT SURVEYS:  LEFS 42 / 80 = 52.5 %  COGNITION: Overall cognitive status: Within functional limits for tasks assessed     SENSATION: WFL  EDEMA:  Circumferential: R  45.5  cm;  L 49 cm  MUSCLE LENGTH: HS: mild tightness Quads:marked bil ITB: NT Piriformis: NT Hip Flexors:NT Heelcords: WFL  POSTURE: flexed trunk   PALPATION: Palpation: TTP at medial knee.   Patellar Mobility: mild decrease sup/inf  LOWER EXTREMITY ROM:   ROM Right  eval Left A/PROM eval Left  08/22/22  Knee flexion 101 65/75 74/80  Knee extension 0 0    (Blank rows = not tested)  LOWER EXTREMITY MMT:  MMT Right eval Left eval  Hip flexion 4 4  Hip extension  4+  Hip abduction  4+  Hip adduction  4  Hip internal rotation    Hip external rotation    Knee flexion 5 4+ in available range  Knee extension 5 4+  Ankle dorsiflexion 5 4+   (Blank rows = not tested)  FUNCTIONAL TESTS:  5 times sit to stand: 24.87 sec with bil UE support Timed up and go (TUG): 27.08 no AD  GAIT: Distance walked: 60 Assistive device utilized: Single point cane Level of assistance: Modified independence Comments: decreased step length and heel strike left   TODAY'S TREATMENT:                                                                                                                               DATE:    08/22/22 THERAPEUTIC EXERCISE: to improve flexibility, strength and mobility.  Verbal and tactile cues throughout for technique. NuStep x6 min L5 Seat moving up to seat 7 for increased flexion Standing knee flexion stretch with foot on stool 1x10 5" hold  Seated LAQ x 10 5 sec hold Seated knee flexion stretch with opp 10 sec hold x 10 sitting on foam Seated knee slides on slider 2x10  Modified Thomas Stretch 3x30" hold   Manual Therapy: to decrease muscle spasm and pain and improve mobility Reviewed scar massage Patellar mobs Joint mobs to increase knee flex Supine PROM in to flexion prolonged holds and active heel slide with PT assist    08/19/22 THERAPEUTIC EXERCISE: to improve flexibility, strength and mobility.  Verbal and tactile cues throughout for technique.  NuStep x6' L4 Standing knee flexion stretch with foot on stool 1x10 5" hold  Seated knee slides 2x10  Fitter leg press  2x10 (1 blue, 1 black)  Modified Thomas Stretch 3x30" hold  Peanut DKTC with strap for assistance 2x10 3" hold   MANUAL THERAPY: To promote improved joint mobility. Instructed on how to properly perform scar massages.  Patellar mobs 4-way    08/17/22 See pt ed  PATIENT EDUCATION:   Education details: HEP update, HEP review, and scar tissue mobilization Person educated: Patient Education method: Explanation, Demonstration, and Handouts Education comprehension: verbalized understanding and returned demonstration  HOME EXERCISE PROGRAM: Access Code: [email or web address] URL: [email or web address] Date: 08/17/2022 Prepared by: Almyra Free  Exercises - Sit to Stand  - 3 x daily - 7 x weekly - 1-2 sets - 10 reps - Seated Long Arc Quad  - 2 x daily - 7 x weekly - 3 sets - 10 reps - 5 sec hold - Seated Knee Flexion Stretch  - 3 x daily - 7 x weekly - 2 sets - 10 reps - max hold - Seated Knee Flexion AAROM  - 3 x daily - 7 x weekly - 2 sets - 10 reps - max  hold  ASSESSMENT:  CLINICAL IMPRESSION:  [name] tolerated aggressive stretching fairly well today. Encouraged her to push herself more at home. She was able to get increased range today by end of session. She returns to MD in two  weeks.  OBJECTIVE IMPAIRMENTS: Abnormal gait, decreased activity tolerance, decreased ROM, decreased strength, increased edema, increased muscle spasms, impaired flexibility, postural dysfunction, and pain.   ACTIVITY LIMITATIONS: sitting, standing, squatting, dressing, and locomotion level  PARTICIPATION LIMITATIONS: driving  PERSONAL FACTORS: 1-2 comorbidities: R TKR, back surgery  are also affecting patient's functional outcome.   REHAB POTENTIAL: Excellent  CLINICAL DECISION MAKING: Stable/uncomplicated  EVALUATION COMPLEXITY: Low  GOALS: Goals reviewed with patient? Yes   SHORT TERM GOALS: Target date: 08/31/2022    Independent with initial HEP. Baseline:  Goal status: IN PROGRESS  LONG TERM GOALS: Target date: 10/12/2022   Independent with advanced/ongoing HEP to improve outcomes and carryover.  Baseline:  Goal status: IN PROGRESS  2.  Dakota Mullins will demonstrate L knee flexion to 115 deg to ascend/descend stairs. Baseline:  Goal status: IN PROGRESS  3.    Normand Damron-Ann Sica will be able to ambulate 600' safely with LRAD and normal gait pattern to access community.  Baseline:  Goal status: IN PROGRESS  4.  Kit Line will be able to ascend/descend 3 stairs with 1 HR and reciprocal step pattern safely to access home and community.  Baseline:  Goal status: IN PROGRESS  5.  Shamonica Petrosian will demonstrate < = 25 sec on TUG to demonstrate decreased risk of falls.   Baseline: 27.08 sec with no AD Goal status: IN PROGRESS  6.  Naleigha Vargo will demonstrate improved functional LE strength by completing 5x STS in < 19  seconds.  (MCID 5 seconds) Baseline: 24.87 sec with bil UE support Goal status: IN PROGRESS  7.  Kielyn Frediani  will report 51/80 on LEFS to demonstrate improved function.   Baseline: 42 / 80 = 52.5 % Goal status: IN PROGRESS   PLAN:  PT FREQUENCY: 2x/week  PT DURATION: 8 weeks  PLANNED INTERVENTIONS: Therapeutic exercises, Therapeutic activity, Neuromuscular re-education, Balance training, Gait training, Patient/Family education, Self Care, Joint mobilization, Stair training, Aquatic Therapy, Dry Needling, Electrical stimulation, Cryotherapy, Moist heat, scar mobilization, Taping, Vasopneumatic device, and Manual therapy  PLAN FOR NEXT SESSION: Focus on L knee flexion and increase knee flexibility. Work on patellar mobs and scar tissue massage to help facilitate increase in ROM.    Jairen Goldfarb, PT 08/22/2022, 10:05 AM

## 2022-08-22 ENCOUNTER — Encounter: Payer: Self-pay | Admitting: Physical Therapy

## 2022-08-22 ENCOUNTER — Ambulatory Visit: Payer: Medicare Other | Admitting: Physical Therapy

## 2022-08-22 DIAGNOSIS — M25662 Stiffness of left knee, not elsewhere classified: Secondary | ICD-10-CM

## 2022-08-22 DIAGNOSIS — Z96652 Presence of left artificial knee joint: Secondary | ICD-10-CM | POA: Diagnosis not present

## 2022-08-22 DIAGNOSIS — M6281 Muscle weakness (generalized): Secondary | ICD-10-CM | POA: Diagnosis not present

## 2022-08-22 DIAGNOSIS — M25562 Pain in left knee: Secondary | ICD-10-CM | POA: Diagnosis not present

## 2022-08-25 ENCOUNTER — Ambulatory Visit: Payer: Medicare Other | Admitting: Physical Therapy

## 2022-08-25 ENCOUNTER — Encounter: Payer: Self-pay | Admitting: Physical Therapy

## 2022-08-25 DIAGNOSIS — M6281 Muscle weakness (generalized): Secondary | ICD-10-CM

## 2022-08-25 DIAGNOSIS — M25562 Pain in left knee: Secondary | ICD-10-CM | POA: Diagnosis not present

## 2022-08-25 DIAGNOSIS — M25662 Stiffness of left knee, not elsewhere classified: Secondary | ICD-10-CM

## 2022-08-25 DIAGNOSIS — Z96652 Presence of left artificial knee joint: Secondary | ICD-10-CM | POA: Diagnosis not present

## 2022-08-25 NOTE — Therapy (Signed)
OUTPATIENT PHYSICAL THERAPY LOWER EXTREMITY TREATMENT   Patient Name: Kristina Burns MRN: [id number] DOB:1946-03-10, 77 y.o., female Today's Date: 08/25/2022  END OF SESSION:  PT End of Session - 08/25/22 0847     Visit Number 4    Date for PT Re-Evaluation 10/12/22    Authorization Type BCBS MCR    Progress Note Due on Visit 10    PT Start Time 0847    PT Stop Time 0931    PT Time Calculation (min) 44 min    Activity Tolerance Patient tolerated treatment well    Behavior During Therapy [facility] for tasks assessed/performed               Past Medical History:  Diagnosis Date   Arthritis    Asthma    High cholesterol    Past Surgical History:  Procedure Laterality Date   ABDOMINAL HYSTERECTOMY     BACK SURGERY     TOTAL KNEE ARTHROPLASTY Right 08/21/2018   Procedure: RIGHT TOTAL KNEE ARTHROPLASTY;  Surgeon: Mcarthur Rossetti, MD;  Location: [place];  Service: Orthopedics;  Laterality: Right;   TOTAL KNEE ARTHROPLASTY Left 07/14/2022   Procedure: LEFT TOTAL KNEE ARTHROPLASTY;  Surgeon: Mcarthur Rossetti, MD;  Location: [place];  Service: Orthopedics;  Laterality: Left;   Patient Active Problem List   Diagnosis Date Noted   OA (osteoarthritis) of knee 07/14/2022   Status post total left knee replacement 07/14/2022   Status post total knee replacement, right 08/21/2018   Chronic pain of both knees 07/10/2017   Unilateral primary osteoarthritis, left knee 07/10/2017    PCP: Haywood Pao, MD  REFERRING PROVIDER: Pete Pelt, PA-C  REFERRING DIAG: [phone] (ICD-10-CM) - Status post total left knee replacement   THERAPY DIAG:  Stiffness of left knee, not elsewhere classified  Acute pain of left knee  Muscle weakness (generalized)  RATIONALE FOR EVALUATION AND TREATMENT: Rehabilitation  ONSET DATE: 07/14/22  NEXT MD VISIT: 09/01/22   SUBJECTIVE:   SUBJECTIVE STATEMENT: Patient reports some soreness from the exercises but denies any pain this  morning.  PAIN:  Are you having pain? No  PERTINENT HISTORY: R TKR, back surgery, asthma  PRECAUTIONS: None  WEIGHT BEARING RESTRICTIONS: No  FALLS:  Has patient fallen in last 6 months? No  LIVING ENVIRONMENT: Lives with: lives with their spouse Lives in: House/apartment Stairs: Yes: External: 2-3 steps; can reach both Has following equipment at home: Single point cane and Walker - 2 wheeled  OCCUPATION: retired  PLOF: Independent  PATIENT GOALS: get back to normal, wants to drive    OBJECTIVE:   DIAGNOSTIC FINDINGS: N/A  PATIENT SURVEYS:  LEFS 42 / 80 = 52.5 %  COGNITION: Overall cognitive status: Within functional limits for tasks assessed     SENSATION: WFL  EDEMA:  Circumferential: R  45.5  cm;  L 49 cm  MUSCLE LENGTH: HS: mild tightness Quads:marked bil ITB: NT Piriformis: NT Hip Flexors:NT Heelcords: WFL  POSTURE: flexed trunk   PALPATION: Palpation: TTP at medial knee.   Patellar Mobility: mild decrease sup/inf  LOWER EXTREMITY ROM:   ROM Right  AROM eval Left A/PROM eval Left  A/PROM 08/22/22  Knee flexion 101 65/75 74/80  Knee extension 0 0    (Blank rows = not tested)  LOWER EXTREMITY MMT:  MMT Right eval Left eval  Hip flexion 4 4  Hip extension  4+  Hip abduction  4+  Hip adduction  4  Hip internal rotation  Hip external rotation    Knee flexion 5 4+ in available range  Knee extension 5 4+  Ankle dorsiflexion 5 4+   (Blank rows = not tested)  FUNCTIONAL TESTS:  5 times sit to stand: 24.87 sec with bil UE support Timed up and go (TUG): 27.08 no AD  GAIT: Distance walked: 60 Assistive device utilized: Single point cane Level of assistance: Modified independence Comments: decreased step length and heel strike left   TODAY'S TREATMENT:                                                                                                                              DATE:    08/25/22 THERAPEUTIC EXERCISE: to  improve flexibility, strength and mobility.  Verbal and tactile cues throughout for technique.  NuStep - L5 x 6 min (seat #7) Supported squat 2 x 10; verbal and visual cues (mirror feedback) for uprghteven weight shift Alt toe clears to 9" stool x 20 - focusing on L hip and knee flexion for foot clearance onto step L knee flexion step stretch on 9" stool 10 x 5", 2 sets Seated Fitter leg press (1 black/1 blue) x 20 BATCA knee flexion B LE - 20# x 10; B con/L ecc 15# x 10 BATCA knee extension B LE - 15# 2 x 10 - distal bar set to create slight stretch into knee flexion on release   08/22/22 THERAPEUTIC EXERCISE: to improve flexibility, strength and mobility.  Verbal and tactile cues throughout for technique. NuStep x 6 min, L5 Seat moving up to seat 7 for increased flexion Standing knee flexion stretch with foot on stool 1x10 5" hold  Seated LAQ x 10 5 sec hold Seated knee flexion stretch with opp 10 sec hold x 10 sitting on foam Seated knee slides on slider 2x10  Modified Thomas Stretch 3x30" hold   Manual Therapy: to decrease muscle spasm and pain and improve mobility Reviewed scar massage Patellar mobs Joint mobs to increase knee flex Supine PROM in to flexion prolonged holds and active heel slide with PT assist   08/19/22 THERAPEUTIC EXERCISE: to improve flexibility, strength and mobility.  Verbal and tactile cues throughout for technique.  NuStep x 6' L4 Standing knee flexion stretch with foot on stool 1x10 5" hold  Seated knee slides 2x10  Fitter leg press  2x10 (1 blue, 1 black)  Modified Thomas Stretch 3x30" hold  Peanut DKTC with strap for assistance 2x10 3" hold   MANUAL THERAPY: To promote improved joint mobility. Instructed on how to properly perform scar massages.  Patellar mobs 4-way    PATIENT EDUCATION:   Education details: HEP update, HEP review, and scar tissue mobilization Person educated: Patient Education method: Explanation, Demonstration, and  Handouts Education comprehension: verbalized understanding and returned demonstration  HOME EXERCISE PROGRAM: Access Code: [email or web address] URL: [email or web address] Date: 08/19/2022 Prepared by: Annie Paras  Exercises - Sit to Stand  - 3 x daily - 7 x weekly -  1-2 sets - 10 reps - Seated Long Arc Quad  - 2 x daily - 7 x weekly - 3 sets - 10 reps - 5 sec hold - Seated Knee Flexion Stretch  - 3 x daily - 7 x weekly - 2 sets - 10 reps - max hold - Seated Knee Flexion AAROM  - 3 x daily - 7 x weekly - 2 sets - 10 reps - max hold - Supine Quadriceps Stretch with Strap on Table  - 2-3 x daily - 7 x weekly - 3 reps - 30 sec hold - Supine Heel Slide with Strap  - 2-3 x daily - 7 x weekly - 2 sets - 10 reps - 3 sec hold - Long Sitting Inferior Patellar Glide  - 2 x daily - 7 x weekly - 2 sets - 10 reps - 3 sec hold - Long Sitting Superior Patellar Glide  - 2 x daily - 7 x weekly - 2 sets - 10 reps - 3 sec hold - Long Sitting Medial Patellar Glide  - 2 x daily - 7 x weekly - 2 sets - 10 reps - 3 sec hold - Long Sitting Lateral Patellar Glide  - 2 x daily - 7 x weekly - 2 sets - 10 reps - 3 sec hold  Patient Education - Scar Massage  ASSESSMENT:  CLINICAL IMPRESSION:  Kristina "[name]" reports some soreness from performing seated knee pendulum exercises at home as recommended by ortho PA, but otherwise denies pain. She denies any concerns with HEP and denies need for review today - STG met. TE continues to target L knee flexion ROM through dynamic stretching and functional movements as well as overpressure into flexion following active extension ROM. Pt noting sensation of improving tolerance for flexion ROM as exercises progressed. She will f/u with MD on 09/01/22 to determine if MUA is indicated continued progress with L knee flexion ROM - pt wishing to avoid MUA.  OBJECTIVE IMPAIRMENTS: Abnormal gait, decreased activity tolerance, decreased ROM, decreased strength, increased edema, increased  muscle spasms, impaired flexibility, postural dysfunction, and pain.   ACTIVITY LIMITATIONS: sitting, standing, squatting, dressing, and locomotion level  PARTICIPATION LIMITATIONS: driving  PERSONAL FACTORS: 1-2 comorbidities: R TKR, back surgery  are also affecting patient's functional outcome.   REHAB POTENTIAL: Excellent  CLINICAL DECISION MAKING: Stable/uncomplicated  EVALUATION COMPLEXITY: Low  GOALS: Goals reviewed with patient? Yes   SHORT TERM GOALS: Target date: 08/31/2022    Independent with initial HEP. Baseline:  Goal status: MET  08/25/22  LONG TERM GOALS: Target date: 10/12/2022   Independent with advanced/ongoing HEP to improve outcomes and carryover.  Baseline:  Goal status: IN PROGRESS  2.  Lucille Stoudt will demonstrate L knee flexion to 115 deg to ascend/descend stairs. Baseline:  Goal status: IN PROGRESS  3.    Leeann Korpi will be able to ambulate 600' safely with LRAD and normal gait pattern to access community.  Baseline:  Goal status: IN PROGRESS  4.  Kaliope Moncivais will be able to ascend/descend 3 stairs with 1 HR and reciprocal step pattern safely to access home and community.  Baseline:  Goal status: IN PROGRESS  5.  Dawnetta Schubach will demonstrate < = 25 sec on TUG to demonstrate decreased risk of falls.   Baseline: 27.08 sec with no AD Goal status: IN PROGRESS  6.  Georgena Opfer will demonstrate improved functional LE strength by completing 5x STS in < 19  seconds.  (MCID 5 seconds) Baseline: 24.87 sec  with bil UE support Goal status: IN PROGRESS  7.  Reem Fennelly will report 51/80 on LEFS to demonstrate improved function.   Baseline: 42 / 80 = 52.5 % Goal status: IN PROGRESS   PLAN:  PT FREQUENCY: 2x/week  PT DURATION: 8 weeks  PLANNED INTERVENTIONS: Therapeutic exercises, Therapeutic activity, Neuromuscular re-education, Balance training, Gait training, Patient/Family education, Self Care, Joint mobilization, Stair  training, Aquatic Therapy, Dry Needling, Electrical stimulation, Cryotherapy, Moist heat, scar mobilization, Taping, Vasopneumatic device, and Manual therapy  PLAN FOR NEXT SESSION: Focus on L knee flexion and increase knee flexibility. Work on patellar mobs and scar tissue massage to help facilitate increase in ROM.    Percival Spanish, PT 08/25/2022, 10:05 AM

## 2022-08-28 NOTE — Therapy (Signed)
OUTPATIENT PHYSICAL THERAPY LOWER EXTREMITY TREATMENT   Patient Name: Caidance Sybert MRN: [id number] DOB:[birth year], 77 y.o., female Today's Date: 08/29/2022  END OF SESSION:  PT End of Session - 08/29/22 0937     Visit Number 5    Date for PT Re-Evaluation 10/12/22    Authorization Type BCBS MCR    Progress Note Due on Visit 10    PT Start Time 0935    PT Stop Time 1015    PT Time Calculation (min) 40 min    Activity Tolerance Patient tolerated treatment well    Behavior During Therapy WFL for tasks assessed/performed               Past Medical History:  Diagnosis Date   Arthritis    Asthma    High cholesterol    Past Surgical History:  Procedure Laterality Date   ABDOMINAL HYSTERECTOMY     BACK SURGERY     TOTAL KNEE ARTHROPLASTY Right 08/21/2018   Procedure: RIGHT TOTAL KNEE ARTHROPLASTY;  Surgeon: Mcarthur Rossetti, MD;  Location: [place];  Service: Orthopedics;  Laterality: Right;   TOTAL KNEE ARTHROPLASTY Left 07/14/2022   Procedure: LEFT TOTAL KNEE ARTHROPLASTY;  Surgeon: Mcarthur Rossetti, MD;  Location: [place];  Service: Orthopedics;  Laterality: Left;   Patient Active Problem List   Diagnosis Date Noted   OA (osteoarthritis) of knee 07/14/2022   Status post total left knee replacement 07/14/2022   Status post total knee replacement, right 08/21/2018   Chronic pain of both knees 07/10/2017   Unilateral primary osteoarthritis, left knee 07/10/2017    PCP: Tisovec, Fransico Him, MD  REFERRING PROVIDER: Pete Pelt, PA-C  REFERRING DIAG: [phone] (ICD-10-CM) - Status post total left knee replacement   THERAPY DIAG:  Stiffness of left knee, not elsewhere classified  Acute pain of left knee  Muscle weakness (generalized)  RATIONALE FOR EVALUATION AND TREATMENT: Rehabilitation  ONSET DATE: 07/14/22  NEXT MD VISIT: 09/01/22   SUBJECTIVE:   SUBJECTIVE STATEMENT: I'm tired of this thing. I want to get busy.  PAIN:  Are you having  pain? No  PERTINENT HISTORY: R TKR, back surgery, asthma  PRECAUTIONS: None  WEIGHT BEARING RESTRICTIONS: No  FALLS:  Has patient fallen in last 6 months? No  LIVING ENVIRONMENT: Lives with: lives with their spouse Lives in: House/apartment Stairs: Yes: External: 2-3 steps; can reach both Has following equipment at home: Single point cane and Walker - 2 wheeled  OCCUPATION: retired  PLOF: Independent  PATIENT GOALS: get back to normal, wants to drive    OBJECTIVE:   DIAGNOSTIC FINDINGS: N/A  PATIENT SURVEYS:  LEFS 42 / 80 = 52.5 %  COGNITION: Overall cognitive status: Within functional limits for tasks assessed     SENSATION: WFL  EDEMA:  Circumferential: R  45.5  cm;  L 49 cm  MUSCLE LENGTH: HS: mild tightness Quads:marked bil ITB: NT Piriformis: NT Hip Flexors:NT Heelcords: WFL  POSTURE: flexed trunk   PALPATION: Palpation: TTP at medial knee.   Patellar Mobility: mild decrease sup/inf  LOWER EXTREMITY ROM:   ROM Right  AROM eval Left A/PROM eval Left  A/PROM 08/22/22 Left  A/PROM 08/29/22  Knee flexion 101 '65/75 74/80 74/85 '$  Knee extension 0 0     (Blank rows = not tested)  LOWER EXTREMITY MMT:  MMT Right eval Left eval Left  08/29/22  Hip flexion 4 4 4+  Hip extension  4+   Hip abduction  4+   Hip  adduction  4   Hip internal rotation     Hip external rotation     Knee flexion 5 4+ in available range 4+ in available range  Knee extension 5 4+ 4+  Ankle dorsiflexion 5 4+    (Blank rows = not tested)  FUNCTIONAL TESTS:  5 times sit to stand: 24.87 sec with bil UE support Timed up and go (TUG): 27.08 no AD  GAIT: Distance walked: 60 Assistive device utilized: Single point cane Level of assistance: Modified independence Comments: decreased step length and heel strike left   TODAY'S TREATMENT:                                                                                                                              DATE:     08/29/22 THERAPEUTIC EXERCISE: to improve flexibility, strength and mobility.  Verbal and tactile cues throughout for technique.  NuStep - L5 x 6 min (seat #7) Supported squat 2 x 10; verbal and visual cues (mirror feedback) for uprghteven weight shift Alt toe clears to 9" stool x 20 - focusing on L hip and knee flexion for foot clearance onto step L knee flexion step stretch on 9" stool 10 x 5", 2 sets Seated Fitter leg press (1 black/1 blue) x 20 BATCA knee flexion B LE - 20# x 10; B con/L ecc 15# x 10, L only conc/ecc 5# x 10 BATCA knee extension B LE - 15# 2 x 10 - distal bar set to create slight stretch into knee flexion on release  MANUAL THERAPY: PROM into flexion, MMT and ROM for MD note   08/25/22 THERAPEUTIC EXERCISE: to improve flexibility, strength and mobility.  Verbal and tactile cues throughout for technique.  NuStep - L5 x 6 min (seat #7) Supported squat 2 x 10; verbal and visual cues (mirror feedback) for uprghteven weight shift Alt toe clears to 9" stool x 20 - focusing on L hip and knee flexion for foot clearance onto step L knee flexion step stretch on 9" stool 10 x 5", 2 sets Seated Fitter leg press (1 black/1 blue) x 20 BATCA knee flexion B LE - 20# x 10; B con/L ecc 15# x 10 BATCA knee extension B LE - 15# 2 x 10 - distal bar set to create slight stretch into knee flexion on release     08/22/22 THERAPEUTIC EXERCISE: to improve flexibility, strength and mobility.  Verbal and tactile cues throughout for technique. NuStep x 6 min, L5 Seat moving up to seat 7 for increased flexion Standing knee flexion stretch with foot on stool 1x10 5" hold  Seated LAQ x 10 5 sec hold Seated knee flexion stretch with opp 10 sec hold x 10 sitting on foam Seated knee slides on slider 2x10  Modified Thomas Stretch 3x30" hold   Manual Therapy: to decrease muscle spasm and pain and improve mobility Reviewed scar massage Patellar mobs Joint mobs to increase knee flex Supine  PROM in to flexion prolonged holds and active heel slide with PT assist   PATIENT EDUCATION:   Education details: HEP update, HEP review, and scar tissue mobilization Person educated: Patient Education method: Explanation, Demonstration, and Handouts Education comprehension: verbalized understanding and returned demonstration  HOME EXERCISE PROGRAM: Access Code: [email or web address] URL: [email or web address] Date: 08/19/2022 Prepared by: Annie Paras  Exercises - Sit to Stand  - 3 x daily - 7 x weekly - 1-2 sets - 10 reps - Seated Long Arc Quad  - 2 x daily - 7 x weekly - 3 sets - 10 reps - 5 sec hold - Seated Knee Flexion Stretch  - 3 x daily - 7 x weekly - 2 sets - 10 reps - max hold - Seated Knee Flexion AAROM  - 3 x daily - 7 x weekly - 2 sets - 10 reps - max hold - Supine Quadriceps Stretch with Strap on Table  - 2-3 x daily - 7 x weekly - 3 reps - 30 sec hold - Supine Heel Slide with Strap  - 2-3 x daily - 7 x weekly - 2 sets - 10 reps - 3 sec hold - Long Sitting Inferior Patellar Glide  - 2 x daily - 7 x weekly - 2 sets - 10 reps - 3 sec hold - Long Sitting Superior Patellar Glide  - 2 x daily - 7 x weekly - 2 sets - 10 reps - 3 sec hold - Long Sitting Medial Patellar Glide  - 2 x daily - 7 x weekly - 2 sets - 10 reps - 3 sec hold - Long Sitting Lateral Patellar Glide  - 2 x daily - 7 x weekly - 2 sets - 10 reps - 3 sec hold  Patient Education - Scar Massage  ASSESSMENT:  CLINICAL IMPRESSION:  [name] continues to be limited in flexion to about 70 degrees. She was able to get to 74 deg actively at end of session today, but began her session this morning at 62 degrees. Passively we got to 85 degrees. She reports compliance with HEP. She will f/u with MD on 09/01/22 to determine if MUA is indicated continued progress with L knee flexion ROM - pt wishing to avoid MUA. MD note completed today.  OBJECTIVE IMPAIRMENTS: Abnormal gait, decreased activity tolerance, decreased ROM,  decreased strength, increased edema, increased muscle spasms, impaired flexibility, postural dysfunction, and pain.   ACTIVITY LIMITATIONS: sitting, standing, squatting, dressing, and locomotion level  PARTICIPATION LIMITATIONS: driving  PERSONAL FACTORS: 1-2 comorbidities: R TKR, back surgery  are also affecting patient's functional outcome.   REHAB POTENTIAL: Excellent  CLINICAL DECISION MAKING: Stable/uncomplicated  EVALUATION COMPLEXITY: Low  GOALS: Goals reviewed with patient? Yes   SHORT TERM GOALS: Target date: 08/31/2022    Independent with initial HEP. Baseline:  Goal status: MET  08/25/22  LONG TERM GOALS: Target date: 10/12/2022   Independent with advanced/ongoing HEP to improve outcomes and carryover.  Baseline:  Goal status: IN PROGRESS  2.  Rakisha Burling will demonstrate L knee flexion to 115 deg to ascend/descend stairs. Baseline:  Goal status: IN PROGRESS 08/29/22  3.    Dameisha Lengyel will be able to ambulate 600' safely with LRAD and normal gait pattern to access community.  Baseline:  Goal status: IN PROGRESS  4.  Onesti Kiesler will be able to ascend/descend 3 stairs with 1 HR and reciprocal step pattern safely to access home and community.  Baseline:  Goal status: IN PROGRESS  5.  Solara Ochsner  will demonstrate < = 25 sec on TUG to demonstrate decreased risk of falls.   Baseline: 27.08 sec with no AD Goal status: IN PROGRESS  6.  Raine Winfield will demonstrate improved functional LE strength by completing 5x STS in < 19  seconds.  (MCID 5 seconds) Baseline: 24.87 sec with bil UE support Goal status: IN PROGRESS  7.  Snigdha Matte will report 51/80 on LEFS to demonstrate improved function.   Baseline: 42 / 80 = 52.5 % Goal status: IN PROGRESS   PLAN:  PT FREQUENCY: 2x/week  PT DURATION: 8 weeks  PLANNED INTERVENTIONS: Therapeutic exercises, Therapeutic activity, Neuromuscular re-education, Balance training, Gait training,  Patient/Family education, Self Care, Joint mobilization, Stair training, Aquatic Therapy, Dry Needling, Electrical stimulation, Cryotherapy, Moist heat, scar mobilization, Taping, Vasopneumatic device, and Manual therapy  PLAN FOR NEXT SESSION: Focus on L knee flexion and increase knee flexibility. Work on patellar mobs and scar tissue massage to help facilitate increase in ROM.    Darryle Dennie, PT 08/29/2022, 11:28 AM

## 2022-08-29 ENCOUNTER — Ambulatory Visit: Payer: Medicare Other | Admitting: Physical Therapy

## 2022-08-29 ENCOUNTER — Encounter: Payer: Self-pay | Admitting: Physical Therapy

## 2022-08-29 DIAGNOSIS — M6281 Muscle weakness (generalized): Secondary | ICD-10-CM | POA: Diagnosis not present

## 2022-08-29 DIAGNOSIS — M25562 Pain in left knee: Secondary | ICD-10-CM | POA: Diagnosis not present

## 2022-08-29 DIAGNOSIS — Z96652 Presence of left artificial knee joint: Secondary | ICD-10-CM | POA: Diagnosis not present

## 2022-08-29 DIAGNOSIS — M25662 Stiffness of left knee, not elsewhere classified: Secondary | ICD-10-CM | POA: Diagnosis not present

## 2022-09-01 ENCOUNTER — Ambulatory Visit (INDEPENDENT_AMBULATORY_CARE_PROVIDER_SITE_OTHER): Payer: Medicare Other | Admitting: Physician Assistant

## 2022-09-01 ENCOUNTER — Ambulatory Visit: Payer: Medicare Other

## 2022-09-01 ENCOUNTER — Encounter: Payer: Self-pay | Admitting: Physician Assistant

## 2022-09-01 DIAGNOSIS — Z96652 Presence of left artificial knee joint: Secondary | ICD-10-CM | POA: Diagnosis not present

## 2022-09-01 DIAGNOSIS — M25662 Stiffness of left knee, not elsewhere classified: Secondary | ICD-10-CM | POA: Diagnosis not present

## 2022-09-01 DIAGNOSIS — M6281 Muscle weakness (generalized): Secondary | ICD-10-CM | POA: Diagnosis not present

## 2022-09-01 DIAGNOSIS — M25562 Pain in left knee: Secondary | ICD-10-CM | POA: Diagnosis not present

## 2022-09-01 NOTE — Therapy (Signed)
OUTPATIENT PHYSICAL THERAPY LOWER EXTREMITY TREATMENT   Patient Name: Kristina Burns MRN: [id number] DOB:[birth year], 77 y.o., female Today's Date: 09/01/2022  END OF SESSION:  PT End of Session - 09/01/22 0851     Visit Number 6    Date for PT Re-Evaluation 10/12/22    Authorization Type BCBS MCR    Progress Note Due on Visit 10    PT Start Time 0845    PT Stop Time 0930    PT Time Calculation (min) 45 min    Activity Tolerance Patient tolerated treatment well    Behavior During Therapy WFL for tasks assessed/performed                Past Medical History:  Diagnosis Date   Arthritis    Asthma    High cholesterol    Past Surgical History:  Procedure Laterality Date   ABDOMINAL HYSTERECTOMY     BACK SURGERY     TOTAL KNEE ARTHROPLASTY Right 08/21/2018   Procedure: RIGHT TOTAL KNEE ARTHROPLASTY;  Surgeon: Mcarthur Rossetti, MD;  Location: [facility];  Service: Orthopedics;  Laterality: Right;   TOTAL KNEE ARTHROPLASTY Left 07/14/2022   Procedure: LEFT TOTAL KNEE ARTHROPLASTY;  Surgeon: Mcarthur Rossetti, MD;  Location: [facility];  Service: Orthopedics;  Laterality: Left;   Patient Active Problem List   Diagnosis Date Noted   OA (osteoarthritis) of knee 07/14/2022   Status post total left knee replacement 07/14/2022   Status post total knee replacement, right 08/21/2018   Chronic pain of both knees 07/10/2017   Unilateral primary osteoarthritis, left knee 07/10/2017    PCP: Tisovec, Fransico Him, MD  REFERRING PROVIDER: Pete Pelt, PA-C  REFERRING DIAG: [phone] (ICD-10-CM) - Status post total left knee replacement   THERAPY DIAG:  Stiffness of left knee, not elsewhere classified  Acute pain of left knee  Muscle weakness (generalized)  RATIONALE FOR EVALUATION AND TREATMENT: Rehabilitation  ONSET DATE: 07/14/22  NEXT MD VISIT: 09/01/22   SUBJECTIVE:   SUBJECTIVE STATEMENT: No pain today just stiffness in L knee.  PAIN:  Are you having pain?  No  PERTINENT HISTORY: R TKR, back surgery, asthma  PRECAUTIONS: None  WEIGHT BEARING RESTRICTIONS: No  FALLS:  Has patient fallen in last 6 months? No  LIVING ENVIRONMENT: Lives with: lives with their spouse Lives in: House/apartment Stairs: Yes: External: 2-3 steps; can reach both Has following equipment at home: Single point cane and Walker - 2 wheeled  OCCUPATION: retired  PLOF: Independent  PATIENT GOALS: get back to normal, wants to drive    OBJECTIVE:   DIAGNOSTIC FINDINGS: N/A  PATIENT SURVEYS:  LEFS 42 / 80 = 52.5 %  COGNITION: Overall cognitive status: Within functional limits for tasks assessed     SENSATION: WFL  EDEMA:  Circumferential: R  45.5  cm;  L 49 cm  MUSCLE LENGTH: HS: mild tightness Quads:marked bil ITB: NT Piriformis: NT Hip Flexors:NT Heelcords: WFL  POSTURE: flexed trunk   PALPATION: Palpation: TTP at medial knee.   Patellar Mobility: mild decrease sup/inf  LOWER EXTREMITY ROM:   ROM Right  AROM eval Left A/PROM eval Left  A/PROM 08/22/22 Left  A/PROM 08/29/22  Knee flexion 101 '65/75 74/80 74/85 '$  Knee extension 0 0     (Blank rows = not tested)  LOWER EXTREMITY MMT:  MMT Right eval Left eval Left  08/29/22  Hip flexion 4 4 4+  Hip extension  4+   Hip abduction  4+   Hip adduction  4   Hip internal rotation     Hip external rotation     Knee flexion 5 4+ in available range 4+ in available range  Knee extension 5 4+ 4+  Ankle dorsiflexion 5 4+    (Blank rows = not tested)  FUNCTIONAL TESTS:  5 times sit to stand: 24.87 sec with bil UE support Timed up and go (TUG): 27.08 no AD  GAIT: Distance walked: 60 Assistive device utilized: Single point cane Level of assistance: Modified independence Comments: decreased step length and heel strike left   TODAY'S TREATMENT:                                                                                                                              DATE:     09/01/22 THERAPEUTIC EXERCISE: to improve flexibility, strength and mobility.  NuStep - L5 x 6 min  Supine heel slide with strap 2x12 for 5" Seated HS curl RTB on slider x 10 LLE  Manual Therapy: to decrease muscle spasm, pain and improve mobility Tibiofemoral joint mobilization for flexion grade III-IV PROM to L knee into flexion in supine and sitting with tibia IR and distraction  08/29/22 THERAPEUTIC EXERCISE: to improve flexibility, strength and mobility.  Verbal and tactile cues throughout for technique.  NuStep - L5 x 6 min (seat #7) Supported squat 2 x 10; verbal and visual cues (mirror feedback) for uprghteven weight shift Alt toe clears to 9" stool x 20 - focusing on L hip and knee flexion for foot clearance onto step L knee flexion step stretch on 9" stool 10 x 5", 2 sets Seated Fitter leg press (1 black/1 blue) x 20 BATCA knee flexion B LE - 20# x 10; B con/L ecc 15# x 10, L only conc/ecc 5# x 10 BATCA knee extension B LE - 15# 2 x 10 - distal bar set to create slight stretch into knee flexion on release  MANUAL THERAPY: PROM into flexion, MMT and ROM for MD note   08/25/22 THERAPEUTIC EXERCISE: to improve flexibility, strength and mobility.  Verbal and tactile cues throughout for technique.  NuStep - L5 x 6 min (seat #7) Supported squat 2 x 10; verbal and visual cues (mirror feedback) for uprghteven weight shift Alt toe clears to 9" stool x 20 - focusing on L hip and knee flexion for foot clearance onto step L knee flexion step stretch on 9" stool 10 x 5", 2 sets Seated Fitter leg press (1 black/1 blue) x 20 BATCA knee flexion B LE - 20# x 10; B con/L ecc 15# x 10 BATCA knee extension B LE - 15# 2 x 10 - distal bar set to create slight stretch into knee flexion on release     PATIENT EDUCATION:   Education details: HEP update, HEP review, and scar tissue mobilization Person educated: Patient Education method: Explanation, Demonstration, and Handouts Education  comprehension: verbalized understanding and returned demonstration  HOME EXERCISE PROGRAM: Access  Code: [email or web address] URL: [email or web address] Date: 08/19/2022 Prepared by: Annie Paras  Exercises - Sit to Stand  - 3 x daily - 7 x weekly - 1-2 sets - 10 reps - Seated Long Arc Quad  - 2 x daily - 7 x weekly - 3 sets - 10 reps - 5 sec hold - Seated Knee Flexion Stretch  - 3 x daily - 7 x weekly - 2 sets - 10 reps - max hold - Seated Knee Flexion AAROM  - 3 x daily - 7 x weekly - 2 sets - 10 reps - max hold - Supine Quadriceps Stretch with Strap on Table  - 2-3 x daily - 7 x weekly - 3 reps - 30 sec hold - Supine Heel Slide with Strap  - 2-3 x daily - 7 x weekly - 2 sets - 10 reps - 3 sec hold - Long Sitting Inferior Patellar Glide  - 2 x daily - 7 x weekly - 2 sets - 10 reps - 3 sec hold - Long Sitting Superior Patellar Glide  - 2 x daily - 7 x weekly - 2 sets - 10 reps - 3 sec hold - Long Sitting Medial Patellar Glide  - 2 x daily - 7 x weekly - 2 sets - 10 reps - 3 sec hold - Long Sitting Lateral Patellar Glide  - 2 x daily - 7 x weekly - 2 sets - 10 reps - 3 sec hold  Patient Education - Scar Massage  ASSESSMENT:  CLINICAL IMPRESSION:  Advanced patient through TherEx and MT to improve L knee mobility to improve gait and functional ROM. Majority of the session was MT. Pt was able to complete all interventions with min pain. We were able to achieve 88 deg of passive knee flexion and 78 deg of flexion post session.  OBJECTIVE IMPAIRMENTS: Abnormal gait, decreased activity tolerance, decreased ROM, decreased strength, increased edema, increased muscle spasms, impaired flexibility, postural dysfunction, and pain.   ACTIVITY LIMITATIONS: sitting, standing, squatting, dressing, and locomotion level  PARTICIPATION LIMITATIONS: driving  PERSONAL FACTORS: 1-2 comorbidities: R TKR, back surgery  are also affecting patient's functional outcome.   REHAB POTENTIAL:  Excellent  CLINICAL DECISION MAKING: Stable/uncomplicated  EVALUATION COMPLEXITY: Low  GOALS: Goals reviewed with patient? Yes   SHORT TERM GOALS: Target date: 08/31/2022    Independent with initial HEP. Baseline:  Goal status: MET  08/25/22  LONG TERM GOALS: Target date: 10/12/2022   Independent with advanced/ongoing HEP to improve outcomes and carryover.  Baseline:  Goal status: IN PROGRESS  2.  Kristina Burns will demonstrate L knee flexion to 115 deg to ascend/descend stairs. Baseline:  Goal status: IN PROGRESS 08/29/22  3.    Kristina Burns will be able to ambulate 600' safely with LRAD and normal gait pattern to access community.  Baseline:  Goal status: IN PROGRESS  4.  Kristina Burns will be able to ascend/descend 3 stairs with 1 HR and reciprocal step pattern safely to access home and community.  Baseline:  Goal status: IN PROGRESS  5.  Kristina Burns will demonstrate < = 25 sec on TUG to demonstrate decreased risk of falls.   Baseline: 27.08 sec with no AD Goal status: IN PROGRESS  6.  Kristina Burns will demonstrate improved functional LE strength by completing 5x STS in < 19  seconds.  (MCID 5 seconds) Baseline: 24.87 sec with bil UE support Goal status: IN PROGRESS  7.  Kristina Burns will report 51/80 on LEFS to demonstrate improved  function.   Baseline: 42 / 80 = 52.5 % Goal status: IN PROGRESS   PLAN:  PT FREQUENCY: 2x/week  PT DURATION: 8 weeks  PLANNED INTERVENTIONS: Therapeutic exercises, Therapeutic activity, Neuromuscular re-education, Balance training, Gait training, Patient/Family education, Self Care, Joint mobilization, Stair training, Aquatic Therapy, Dry Needling, Electrical stimulation, Cryotherapy, Moist heat, scar mobilization, Taping, Vasopneumatic device, and Manual therapy  PLAN FOR NEXT SESSION: Focus on L knee flexion and increase knee flexibility. Work on patellar mobs and scar tissue massage to help facilitate increase in  ROM.    Artist Pais, PTA 09/01/2022, 9:37 AM

## 2022-09-01 NOTE — Progress Notes (Signed)
HPI: Kristina Burns returns today for follow-up of her left total knee.  She is now 7 weeks postop.  She states she is overall improving.  She feels her range of motion is definitely improved.  She is taking no pain medication.  Having no significant pain in the knee.  Physical exam: Left knee surgical incisions healing well no signs of infection or dehiscence.  Full extension flexion 90 degrees.  No gross instability.  Dorsiflexion plantarflexion left ankle intact.  Impression: Status post left total knee arthroplasty 07/14/2022  Plan: She will continue to work on range of motion strengthening.  See her back in just 2 weeks if she is unable to get 105 degrees we will strongly recommend left knee manipulation under anesthesia.  Questions were encouraged and answered.

## 2022-09-04 NOTE — Therapy (Signed)
OUTPATIENT PHYSICAL THERAPY LOWER EXTREMITY TREATMENT   Patient Name: Kristina Burns MRN: [id number] DOB:1946-03-05, 77 y.o., female Today's Date: 09/05/2022  END OF SESSION:  PT End of Session - 09/05/22 0842     Visit Number 7    Date for PT Re-Evaluation 10/12/22    Authorization Type BCBS MCR    Progress Note Due on Visit 10    PT Start Time 0839    PT Stop Time 0928    PT Time Calculation (min) 49 min    Activity Tolerance Patient tolerated treatment well    Behavior During Therapy [facility] for tasks assessed/performed                Past Medical History:  Diagnosis Date   Arthritis    Asthma    High cholesterol    Past Surgical History:  Procedure Laterality Date   ABDOMINAL HYSTERECTOMY     BACK SURGERY     TOTAL KNEE ARTHROPLASTY Right 08/21/2018   Procedure: RIGHT TOTAL KNEE ARTHROPLASTY;  Surgeon: Mcarthur Rossetti, MD;  Location: [place];  Service: Orthopedics;  Laterality: Right;   TOTAL KNEE ARTHROPLASTY Left 07/14/2022   Procedure: LEFT TOTAL KNEE ARTHROPLASTY;  Surgeon: Mcarthur Rossetti, MD;  Location: [place];  Service: Orthopedics;  Laterality: Left;   Patient Active Problem List   Diagnosis Date Noted   OA (osteoarthritis) of knee 07/14/2022   Status post total left knee replacement 07/14/2022   Status post total knee replacement, right 08/21/2018   Chronic pain of both knees 07/10/2017   Unilateral primary osteoarthritis, left knee 07/10/2017    PCP: Tisovec, Fransico Him, MD  REFERRING PROVIDER: Pete Pelt, PA-C  REFERRING DIAG: [phone] (ICD-10-CM) - Status post total left knee replacement   THERAPY DIAG:  Stiffness of left knee, not elsewhere classified  Acute pain of left knee  Muscle weakness (generalized)  RATIONALE FOR EVALUATION AND TREATMENT: Rehabilitation  ONSET DATE: 07/14/22  NEXT MD VISIT: 09/01/22   SUBJECTIVE:   SUBJECTIVE STATEMENT: Saw MD and pt reports he wants her to get to 105 in 2 weeks.   PAIN:   Are you having pain? No  PERTINENT HISTORY: R TKR, back surgery, asthma  PRECAUTIONS: None  WEIGHT BEARING RESTRICTIONS: No  FALLS:  Has patient fallen in last 6 months? No  LIVING ENVIRONMENT: Lives with: lives with their spouse Lives in: House/apartment Stairs: Yes: External: 2-3 steps; can reach both Has following equipment at home: Single point cane and Walker - 2 wheeled  OCCUPATION: retired  PLOF: Independent  PATIENT GOALS: get back to normal, wants to drive    OBJECTIVE:   DIAGNOSTIC FINDINGS: N/A  PATIENT SURVEYS:  LEFS 42 / 80 = 52.5 %  COGNITION: Overall cognitive status: Within functional limits for tasks assessed     SENSATION: WFL  EDEMA:  Circumferential: R  45.5  cm;  L 49 cm  MUSCLE LENGTH: HS: mild tightness Quads:marked bil ITB: NT Piriformis: NT Hip Flexors:NT Heelcords: WFL  POSTURE: flexed trunk   PALPATION: Palpation: TTP at medial knee.   Patellar Mobility: mild decrease sup/inf  LOWER EXTREMITY ROM:   ROM Right  AROM eval Left A/PROM eval Left  A/PROM 08/22/22 Left  A/PROM 08/29/22  Knee flexion 101 '65/75 74/80 74/85 '$  Knee extension 0 0     (Blank rows = not tested)  LOWER EXTREMITY MMT:  MMT Right eval Left eval Left  08/29/22  Hip flexion 4 4 4+  Hip extension  4+  Hip abduction  4+   Hip adduction  4   Hip internal rotation     Hip external rotation     Knee flexion 5 4+ in available range 4+ in available range  Knee extension 5 4+ 4+  Ankle dorsiflexion 5 4+    (Blank rows = not tested)  FUNCTIONAL TESTS:  5 times sit to stand: 24.87 sec with bil UE support Timed up and go (TUG): 27.08 no AD  GAIT: Distance walked: 60 Assistive device utilized: Single point cane Level of assistance: Modified independence Comments: decreased step length and heel strike left   TODAY'S TREATMENT:                                                                                                                               DATE:    09/05/22 THERAPEUTIC EXERCISE: to improve flexibility, strength and mobility.  Bike partial revolutions x 6 min  Fitter AA flexion with PT overpressure Fitter extension one blue/black x 10 for knee ext Step up 6 in forward and lateral x 10; unable to do 8 inch lateral Prone knee flexion with strap assist 5 sec hold x 10 Prone knee flexion stretch with strap 3x30 sec  Manual Therapy: to decrease muscle spasm, pain and improve mobility STM and IASTM to left quads and ITB Tibiofemoral joint mobilization for flexion grade III-IV PROM to L knee into flexion in supine and sitting with tibia IR    09/01/22 THERAPEUTIC EXERCISE: to improve flexibility, strength and mobility.  NuStep - L5 x 6 min  Supine heel slide with strap 2x12 for 5" Seated HS curl RTB on slider x 10 LLE  Manual Therapy: to decrease muscle spasm, pain and improve mobility Tibiofemoral joint mobilization for flexion grade III-IV PROM to L knee into flexion in supine and sitting with tibia IR and distraction  08/29/22 THERAPEUTIC EXERCISE: to improve flexibility, strength and mobility.  Verbal and tactile cues throughout for technique.  NuStep - L5 x 6 min (seat #7) Supported squat 2 x 10; verbal and visual cues (mirror feedback) for uprghteven weight shift Alt toe clears to 9" stool x 20 - focusing on L hip and knee flexion for foot clearance onto step L knee flexion step stretch on 9" stool 10 x 5", 2 sets Seated Fitter leg press (1 black/1 blue) x 20 BATCA knee flexion B LE - 20# x 10; B con/L ecc 15# x 10, L only conc/ecc 5# x 10 BATCA knee extension B LE - 15# 2 x 10 - distal bar set to create slight stretch into knee flexion on release  MANUAL THERAPY: PROM into flexion, MMT and ROM for MD note   08/25/22 THERAPEUTIC EXERCISE: to improve flexibility, strength and mobility.  Verbal and tactile cues throughout for technique.  NuStep - L5 x 6 min (seat #7) Supported squat 2 x 10; verbal and  visual cues (mirror feedback) for uprghteven weight shift Alt toe clears  to 9" stool x 20 - focusing on L hip and knee flexion for foot clearance onto step L knee flexion step stretch on 9" stool 10 x 5", 2 sets Seated Fitter leg press (1 black/1 blue) x 20 BATCA knee flexion B LE - 20# x 10; B con/L ecc 15# x 10 BATCA knee extension B LE - 15# 2 x 10 - distal bar set to create slight stretch into knee flexion on release     PATIENT EDUCATION:   Education details: HEP update, HEP review, and scar tissue mobilization Person educated: Patient Education method: Explanation, Demonstration, and Handouts Education comprehension: verbalized understanding and returned demonstration  HOME EXERCISE PROGRAM: Access Code: [email or web address] URL: [email or web address] Date: 09/05/2022 Prepared by: Almyra Free  Exercises - Sit to Stand  - 3 x daily - 7 x weekly - 1-2 sets - 10 reps - Seated Long Arc Quad  - 2 x daily - 7 x weekly - 3 sets - 10 reps - 5 sec hold - Seated Knee Flexion Stretch  - 3 x daily - 7 x weekly - 2 sets - 10 reps - max hold - Seated Knee Flexion AAROM  - 3 x daily - 7 x weekly - 2 sets - 10 reps - max hold - Supine Quadriceps Stretch with Strap on Table  - 2-3 x daily - 7 x weekly - 3 reps - 30 sec hold - Supine Heel Slide with Strap  - 2-3 x daily - 7 x weekly - 2 sets - 10 reps - 3 sec hold - Long Sitting Inferior Patellar Glide  - 2 x daily - 7 x weekly - 2 sets - 10 reps - 3 sec hold - Long Sitting Superior Patellar Glide  - 2 x daily - 7 x weekly - 2 sets - 10 reps - 3 sec hold - Long Sitting Medial Patellar Glide  - 2 x daily - 7 x weekly - 2 sets - 10 reps - 3 sec hold - Long Sitting Lateral Patellar Glide  - 2 x daily - 7 x weekly - 2 sets - 10 reps - 3 sec hold - Prone Quadriceps Stretch with Strap (Mirrored)  - 2-3 x daily - 7 x weekly - 1 sets - 3 reps - 30-60 sec hold  Patient Education - Scar Massage  ASSESSMENT:  CLINICAL IMPRESSION:  [name] continues to be  very limited in flexion. She continues to respond well to manual therapy. Very painful in left quads. Added prone quad stretch for HEP.   OBJECTIVE IMPAIRMENTS: Abnormal gait, decreased activity tolerance, decreased ROM, decreased strength, increased edema, increased muscle spasms, impaired flexibility, postural dysfunction, and pain.   ACTIVITY LIMITATIONS: sitting, standing, squatting, dressing, and locomotion level  PARTICIPATION LIMITATIONS: driving  PERSONAL FACTORS: 1-2 comorbidities: R TKR, back surgery  are also affecting patient's functional outcome.   REHAB POTENTIAL: Excellent  CLINICAL DECISION MAKING: Stable/uncomplicated  EVALUATION COMPLEXITY: Low  GOALS: Goals reviewed with patient? Yes   SHORT TERM GOALS: Target date: 08/31/2022    Independent with initial HEP. Baseline:  Goal status: MET  08/25/22  LONG TERM GOALS: Target date: 10/12/2022   Independent with advanced/ongoing HEP to improve outcomes and carryover.  Baseline:  Goal status: IN PROGRESS  2.  Mishael Gallus will demonstrate L knee flexion to 115 deg to ascend/descend stairs. Baseline:  Goal status: IN PROGRESS 08/29/22  3.    Anabella Erich will be able to ambulate 600' safely with LRAD and normal gait pattern to access  community.  Baseline:  Goal status: IN PROGRESS  4.  Graciemae Costales will be able to ascend/descend 3 stairs with 1 HR and reciprocal step pattern safely to access home and community.  Baseline:  Goal status: IN PROGRESS  5.  Jaylinn Breithaupt will demonstrate < = 25 sec on TUG to demonstrate decreased risk of falls.   Baseline: 27.08 sec with no AD Goal status: IN PROGRESS  6.  Dorthea Allard will demonstrate improved functional LE strength by completing 5x STS in < 19  seconds.  (MCID 5 seconds) Baseline: 24.87 sec with bil UE support Goal status: IN PROGRESS  7.  Deseray Hickmon will report 51/80 on LEFS to demonstrate improved function.   Baseline: 42 / 80 = 52.5 % Goal  status: IN PROGRESS   PLAN:  PT FREQUENCY: 2x/week  PT DURATION: 8 weeks  PLANNED INTERVENTIONS: Therapeutic exercises, Therapeutic activity, Neuromuscular re-education, Balance training, Gait training, Patient/Family education, Self Care, Joint mobilization, Stair training, Aquatic Therapy, Dry Needling, Electrical stimulation, Cryotherapy, Moist heat, scar mobilization, Taping, Vasopneumatic device, and Manual therapy  PLAN FOR NEXT SESSION: Focus on L knee flexion and increase knee flexibility. Work on patellar mobs and scar tissue massage to help facilitate increase in ROM.    Honorio Devol, PT 09/05/2022, 9:32 AM

## 2022-09-05 ENCOUNTER — Encounter: Payer: Self-pay | Admitting: Physical Therapy

## 2022-09-05 ENCOUNTER — Ambulatory Visit: Payer: Medicare Other | Admitting: Physical Therapy

## 2022-09-05 DIAGNOSIS — M6281 Muscle weakness (generalized): Secondary | ICD-10-CM | POA: Diagnosis not present

## 2022-09-05 DIAGNOSIS — M25562 Pain in left knee: Secondary | ICD-10-CM

## 2022-09-05 DIAGNOSIS — M25662 Stiffness of left knee, not elsewhere classified: Secondary | ICD-10-CM

## 2022-09-05 DIAGNOSIS — Z96652 Presence of left artificial knee joint: Secondary | ICD-10-CM | POA: Diagnosis not present

## 2022-09-08 ENCOUNTER — Ambulatory Visit: Payer: Medicare Other | Attending: Physician Assistant

## 2022-09-08 DIAGNOSIS — M25562 Pain in left knee: Secondary | ICD-10-CM | POA: Diagnosis not present

## 2022-09-08 DIAGNOSIS — M6281 Muscle weakness (generalized): Secondary | ICD-10-CM | POA: Insufficient documentation

## 2022-09-08 DIAGNOSIS — M25662 Stiffness of left knee, not elsewhere classified: Secondary | ICD-10-CM | POA: Insufficient documentation

## 2022-09-08 NOTE — Therapy (Signed)
OUTPATIENT PHYSICAL THERAPY LOWER EXTREMITY TREATMENT   Patient Name: Kristina Burns MRN: [id number] DOB:[birth year], 77 y.o., female Today's Date: 09/08/2022  END OF SESSION:  PT End of Session - 09/08/22 1533     Visit Number 8    Date for PT Re-Evaluation 10/12/22    Authorization Type BCBS MCR    Progress Note Due on Visit 10    PT Start Time 1448    PT Stop Time 1532    PT Time Calculation (min) 44 min    Activity Tolerance Patient tolerated treatment well    Behavior During Therapy WFL for tasks assessed/performed                 Past Medical History:  Diagnosis Date   Arthritis    Asthma    High cholesterol    Past Surgical History:  Procedure Laterality Date   ABDOMINAL HYSTERECTOMY     BACK SURGERY     TOTAL KNEE ARTHROPLASTY Right 08/21/2018   Procedure: RIGHT TOTAL KNEE ARTHROPLASTY;  Surgeon: Mcarthur Rossetti, MD;  Location: [facility];  Service: Orthopedics;  Laterality: Right;   TOTAL KNEE ARTHROPLASTY Left 07/14/2022   Procedure: LEFT TOTAL KNEE ARTHROPLASTY;  Surgeon: Mcarthur Rossetti, MD;  Location: [facility];  Service: Orthopedics;  Laterality: Left;   Patient Active Problem List   Diagnosis Date Noted   OA (osteoarthritis) of knee 07/14/2022   Status post total left knee replacement 07/14/2022   Status post total knee replacement, right 08/21/2018   Chronic pain of both knees 07/10/2017   Unilateral primary osteoarthritis, left knee 07/10/2017    PCP: Haywood Pao, MD  REFERRING PROVIDER: Pete Pelt, PA-C  REFERRING DIAG: [phone] (ICD-10-CM) - Status post total left knee replacement   THERAPY DIAG:  Stiffness of left knee, not elsewhere classified  Acute pain of left knee  Muscle weakness (generalized)  RATIONALE FOR EVALUATION AND TREATMENT: Rehabilitation  ONSET DATE: 07/14/22  NEXT MD VISIT: 09/01/22   SUBJECTIVE:   SUBJECTIVE STATEMENT: Pt reports having pain earlier but none right now  PAIN:  Are you  having pain? No  PERTINENT HISTORY: R TKR, back surgery, asthma  PRECAUTIONS: None  WEIGHT BEARING RESTRICTIONS: No  FALLS:  Has patient fallen in last 6 months? No  LIVING ENVIRONMENT: Lives with: lives with their spouse Lives in: House/apartment Stairs: Yes: External: 2-3 steps; can reach both Has following equipment at home: Single point cane and Walker - 2 wheeled  OCCUPATION: retired  PLOF: Independent  PATIENT GOALS: get back to normal, wants to drive    OBJECTIVE:   DIAGNOSTIC FINDINGS: N/A  PATIENT SURVEYS:  LEFS 42 / 80 = 52.5 %  COGNITION: Overall cognitive status: Within functional limits for tasks assessed     SENSATION: WFL  EDEMA:  Circumferential: R  45.5  cm;  L 49 cm  MUSCLE LENGTH: HS: mild tightness Quads:marked bil ITB: NT Piriformis: NT Hip Flexors:NT Heelcords: WFL  POSTURE: flexed trunk   PALPATION: Palpation: TTP at medial knee.   Patellar Mobility: mild decrease sup/inf  LOWER EXTREMITY ROM:   ROM Right  AROM eval Left A/PROM eval Left  A/PROM 08/22/22 Left  A/PROM 08/29/22  Knee flexion 101 '65/75 74/80 74/85 '$  Knee extension 0 0     (Blank rows = not tested)  LOWER EXTREMITY MMT:  MMT Right eval Left eval Left  08/29/22  Hip flexion 4 4 4+  Hip extension  4+   Hip abduction  4+  Hip adduction  4   Hip internal rotation     Hip external rotation     Knee flexion 5 4+ in available range 4+ in available range  Knee extension 5 4+ 4+  Ankle dorsiflexion 5 4+    (Blank rows = not tested)  FUNCTIONAL TESTS:  5 times sit to stand: 24.87 sec with bil UE support Timed up and go (TUG): 27.08 no AD  GAIT: Distance walked: 60 Assistive device utilized: Single point cane Level of assistance: Modified independence Comments: decreased step length and heel strike left   TODAY'S TREATMENT:                                                                                                                               DATE:   09/08/22 THERAPEUTIC EXERCISE: to improve flexibility, strength and mobility.  Bike partial revolutions x 6 min Prone quad stretch w/ strap 2x30 sec Supine quad stretch with strap x 30 sec  Manual Therapy: to decrease muscle spasm, pain and improve mobility STM and IASTM to left quads and ITB Tibiofemoral joint mobilization for flexion grade III-IV PROM to L knee into flexion in supine and sitting with tibia IR   09/05/22 THERAPEUTIC EXERCISE: to improve flexibility, strength and mobility.  Bike partial revolutions x 6 min  Fitter AA flexion with PT overpressure Fitter extension one blue/black x 10 for knee ext Step up 6 in forward and lateral x 10; unable to do 8 inch lateral Prone knee flexion with strap assist 5 sec hold x 10 Prone knee flexion stretch with strap 3x30 sec  Manual Therapy: to decrease muscle spasm, pain and improve mobility STM and IASTM to left quads and ITB Tibiofemoral joint mobilization for flexion grade III-IV PROM to L knee into flexion in supine and sitting with tibia IR    09/01/22 THERAPEUTIC EXERCISE: to improve flexibility, strength and mobility.  NuStep - L5 x 6 min  Supine heel slide with strap 2x12 for 5" Seated HS curl RTB on slider x 10 LLE  Manual Therapy: to decrease muscle spasm, pain and improve mobility Tibiofemoral joint mobilization for flexion grade III-IV PROM to L knee into flexion in supine and sitting with tibia IR and distraction  08/29/22 THERAPEUTIC EXERCISE: to improve flexibility, strength and mobility.  Verbal and tactile cues throughout for technique.  NuStep - L5 x 6 min (seat #7) Supported squat 2 x 10; verbal and visual cues (mirror feedback) for uprghteven weight shift Alt toe clears to 9" stool x 20 - focusing on L hip and knee flexion for foot clearance onto step L knee flexion step stretch on 9" stool 10 x 5", 2 sets Seated Fitter leg press (1 black/1 blue) x 20 BATCA knee flexion B LE - 20# x 10; B con/L  ecc 15# x 10, L only conc/ecc 5# x 10 BATCA knee extension B LE - 15# 2 x 10 - distal bar set to create  slight stretch into knee flexion on release  MANUAL THERAPY: PROM into flexion, MMT and ROM for MD note   08/25/22 THERAPEUTIC EXERCISE: to improve flexibility, strength and mobility.  Verbal and tactile cues throughout for technique.  NuStep - L5 x 6 min (seat #7) Supported squat 2 x 10; verbal and visual cues (mirror feedback) for uprghteven weight shift Alt toe clears to 9" stool x 20 - focusing on L hip and knee flexion for foot clearance onto step L knee flexion step stretch on 9" stool 10 x 5", 2 sets Seated Fitter leg press (1 black/1 blue) x 20 BATCA knee flexion B LE - 20# x 10; B con/L ecc 15# x 10 BATCA knee extension B LE - 15# 2 x 10 - distal bar set to create slight stretch into knee flexion on release     PATIENT EDUCATION:   Education details: HEP update, HEP review, and scar tissue mobilization Person educated: Patient Education method: Explanation, Demonstration, and Handouts Education comprehension: verbalized understanding and returned demonstration  HOME EXERCISE PROGRAM: Access Code: [email or web address] URL: [email or web address] Date: 09/05/2022 Prepared by: Almyra Free  Exercises - Sit to Stand  - 3 x daily - 7 x weekly - 1-2 sets - 10 reps - Seated Long Arc Quad  - 2 x daily - 7 x weekly - 3 sets - 10 reps - 5 sec hold - Seated Knee Flexion Stretch  - 3 x daily - 7 x weekly - 2 sets - 10 reps - max hold - Seated Knee Flexion AAROM  - 3 x daily - 7 x weekly - 2 sets - 10 reps - max hold - Supine Quadriceps Stretch with Strap on Table  - 2-3 x daily - 7 x weekly - 3 reps - 30 sec hold - Supine Heel Slide with Strap  - 2-3 x daily - 7 x weekly - 2 sets - 10 reps - 3 sec hold - Long Sitting Inferior Patellar Glide  - 2 x daily - 7 x weekly - 2 sets - 10 reps - 3 sec hold - Long Sitting Superior Patellar Glide  - 2 x daily - 7 x weekly - 2 sets - 10 reps - 3  sec hold - Long Sitting Medial Patellar Glide  - 2 x daily - 7 x weekly - 2 sets - 10 reps - 3 sec hold - Long Sitting Lateral Patellar Glide  - 2 x daily - 7 x weekly - 2 sets - 10 reps - 3 sec hold - Prone Quadriceps Stretch with Strap (Mirrored)  - 2-3 x daily - 7 x weekly - 1 sets - 3 reps - 30-60 sec hold  Patient Education - Scar Massage  ASSESSMENT:  CLINICAL IMPRESSION:  Advanced patient through TherEx to improve L knee flexibility and mobility to restore ROM WNL post op from TKA. Continued with manual to address muscle tension and decreased mobility in TF joint. Pt demonstrated good response to treatment. The TF joint did loosen up a bit after manual therapy.   OBJECTIVE IMPAIRMENTS: Abnormal gait, decreased activity tolerance, decreased ROM, decreased strength, increased edema, increased muscle spasms, impaired flexibility, postural dysfunction, and pain.   ACTIVITY LIMITATIONS: sitting, standing, squatting, dressing, and locomotion level  PARTICIPATION LIMITATIONS: driving  PERSONAL FACTORS: 1-2 comorbidities: R TKR, back surgery  are also affecting patient's functional outcome.   REHAB POTENTIAL: Excellent  CLINICAL DECISION MAKING: Stable/uncomplicated  EVALUATION COMPLEXITY: Low  GOALS: Goals reviewed with patient? Yes   SHORT TERM GOALS:  Target date: 08/31/2022    Independent with initial HEP. Baseline:  Goal status: MET  08/25/22  LONG TERM GOALS: Target date: 10/12/2022   Independent with advanced/ongoing HEP to improve outcomes and carryover.  Baseline:  Goal status: IN PROGRESS  2.  Ziaire Penman will demonstrate L knee flexion to 115 deg to ascend/descend stairs. Baseline:  Goal status: IN PROGRESS 08/29/22  3.    Geni Clinkscales will be able to ambulate 600' safely with LRAD and normal gait pattern to access community.  Baseline:  Goal status: IN PROGRESS  4.  Scotlynn Spruce will be able to ascend/descend 3 stairs with 1 HR and reciprocal step  pattern safely to access home and community.  Baseline:  Goal status: IN PROGRESS  5.  Russie Hurley will demonstrate < = 25 sec on TUG to demonstrate decreased risk of falls.   Baseline: 27.08 sec with no AD Goal status: IN PROGRESS  6.  Rumaisa Cuevas will demonstrate improved functional LE strength by completing 5x STS in < 19  seconds.  (MCID 5 seconds) Baseline: 24.87 sec with bil UE support Goal status: IN PROGRESS  7.  Sylvester Boakye will report 51/80 on LEFS to demonstrate improved function.   Baseline: 42 / 80 = 52.5 % Goal status: IN PROGRESS   PLAN:  PT FREQUENCY: 2x/week  PT DURATION: 8 weeks  PLANNED INTERVENTIONS: Therapeutic exercises, Therapeutic activity, Neuromuscular re-education, Balance training, Gait training, Patient/Family education, Self Care, Joint mobilization, Stair training, Aquatic Therapy, Dry Needling, Electrical stimulation, Cryotherapy, Moist heat, scar mobilization, Taping, Vasopneumatic device, and Manual therapy  PLAN FOR NEXT SESSION: Focus on L knee flexion and increase knee flexibility. Work on patellar mobs and scar tissue massage to help facilitate increase in ROM.    Artist Pais, PTA 09/08/2022, 3:33 PM

## 2022-09-12 ENCOUNTER — Ambulatory Visit: Payer: Medicare Other

## 2022-09-12 DIAGNOSIS — M25562 Pain in left knee: Secondary | ICD-10-CM

## 2022-09-12 DIAGNOSIS — M6281 Muscle weakness (generalized): Secondary | ICD-10-CM

## 2022-09-12 DIAGNOSIS — M25662 Stiffness of left knee, not elsewhere classified: Secondary | ICD-10-CM | POA: Diagnosis not present

## 2022-09-12 NOTE — Therapy (Signed)
OUTPATIENT PHYSICAL THERAPY LOWER EXTREMITY TREATMENT   Patient Name: Kristina Burns MRN: [id number] DOB:1945-12-11, 77 y.o., female Today's Date: 09/12/2022  END OF SESSION:  PT End of Session - 09/12/22 0932     Visit Number 9    Date for PT Re-Evaluation 10/12/22    Authorization Type BCBS MCR    Progress Note Due on Visit 10    PT Start Time [phone]    PT Stop Time 0930    PT Time Calculation (min) 43 min    Activity Tolerance Patient tolerated treatment well    Behavior During Therapy WFL for tasks assessed/performed                  Past Medical History:  Diagnosis Date   Arthritis    Asthma    High cholesterol    Past Surgical History:  Procedure Laterality Date   ABDOMINAL HYSTERECTOMY     BACK SURGERY     TOTAL KNEE ARTHROPLASTY Right 08/21/2018   Procedure: RIGHT TOTAL KNEE ARTHROPLASTY;  Surgeon: Mcarthur Rossetti, MD;  Location: [facility];  Service: Orthopedics;  Laterality: Right;   TOTAL KNEE ARTHROPLASTY Left 07/14/2022   Procedure: LEFT TOTAL KNEE ARTHROPLASTY;  Surgeon: Mcarthur Rossetti, MD;  Location: [facility];  Service: Orthopedics;  Laterality: Left;   Patient Active Problem List   Diagnosis Date Noted   OA (osteoarthritis) of knee 07/14/2022   Status post total left knee replacement 07/14/2022   Status post total knee replacement, right 08/21/2018   Chronic pain of both knees 07/10/2017   Unilateral primary osteoarthritis, left knee 07/10/2017    PCP: Haywood Pao, MD  REFERRING PROVIDER: Pete Pelt, PA-C  REFERRING DIAG: [phone] (ICD-10-CM) - Status post total left knee replacement   THERAPY DIAG:  Stiffness of left knee, not elsewhere classified  Acute pain of left knee  Muscle weakness (generalized)  RATIONALE FOR EVALUATION AND TREATMENT: Rehabilitation  ONSET DATE: 07/14/22  NEXT MD VISIT: 09/01/22   SUBJECTIVE:   SUBJECTIVE STATEMENT: Pt reports continued tightness in L knee  PAIN:  Are you having  pain? No  PERTINENT HISTORY: R TKR, back surgery, asthma  PRECAUTIONS: None  WEIGHT BEARING RESTRICTIONS: No  FALLS:  Has patient fallen in last 6 months? No  LIVING ENVIRONMENT: Lives with: lives with their spouse Lives in: House/apartment Stairs: Yes: External: 2-3 steps; can reach both Has following equipment at home: Single point cane and Walker - 2 wheeled  OCCUPATION: retired  PLOF: Independent  PATIENT GOALS: get back to normal, wants to drive    OBJECTIVE:   DIAGNOSTIC FINDINGS: N/A  PATIENT SURVEYS:  LEFS 42 / 80 = 52.5 %  COGNITION: Overall cognitive status: Within functional limits for tasks assessed     SENSATION: WFL  EDEMA:  Circumferential: R  45.5  cm;  L 49 cm  MUSCLE LENGTH: HS: mild tightness Quads:marked bil ITB: NT Piriformis: NT Hip Flexors:NT Heelcords: WFL  POSTURE: flexed trunk   PALPATION: Palpation: TTP at medial knee.   Patellar Mobility: mild decrease sup/inf  LOWER EXTREMITY ROM:   ROM Right  AROM eval Left A/PROM eval Left  A/PROM 08/22/22 Left  A/PROM 08/29/22 Left PROM 09/12/22  Knee flexion 101 '65/75 74/80 74/85 '$ 84  Knee extension 0 0      (Blank rows = not tested)  LOWER EXTREMITY MMT:  MMT Right eval Left eval Left  08/29/22  Hip flexion 4 4 4+  Hip extension  4+   Hip abduction  4+   Hip adduction  4   Hip internal rotation     Hip external rotation     Knee flexion 5 4+ in available range 4+ in available range  Knee extension 5 4+ 4+  Ankle dorsiflexion 5 4+    (Blank rows = not tested)  FUNCTIONAL TESTS:  5 times sit to stand: 24.87 sec with bil UE support Timed up and go (TUG): 27.08 no AD  GAIT: Distance walked: 60 Assistive device utilized: Single point cane Level of assistance: Modified independence Comments: decreased step length and heel strike left   TODAY'S TREATMENT:                                                                                                                               DATE:    09/12/22 THERAPEUTIC EXERCISE: to improve flexibility, strength and mobility.  Bike partial revolutions x 6 min Knee extension 15# 2x10 Knee flexion 15# 2x10 Seated L knee flexion with slider and strap 10x5"  Manual Therapy: to decrease muscle spasm, pain and improve mobility.  R knee flexion PROM with patellar inferior mobilization TF joint mobilization for flexion grade III-IV  09/08/22 THERAPEUTIC EXERCISE: to improve flexibility, strength and mobility.  Bike partial revolutions x 6 min Prone quad stretch w/ strap 2x30 sec Supine quad stretch with strap x 30 sec  Manual Therapy: to decrease muscle spasm, pain and improve mobility STM and IASTM to left quads and ITB Tibiofemoral joint mobilization for flexion grade III-IV PROM to L knee into flexion in supine and sitting with tibia IR   09/05/22 THERAPEUTIC EXERCISE: to improve flexibility, strength and mobility.  Bike partial revolutions x 6 min  Fitter AA flexion with PT overpressure Fitter extension one blue/black x 10 for knee ext Step up 6 in forward and lateral x 10; unable to do 8 inch lateral Prone knee flexion with strap assist 5 sec hold x 10 Prone knee flexion stretch with strap 3x30 sec  Manual Therapy: to decrease muscle spasm, pain and improve mobility STM and IASTM to left quads and ITB Tibiofemoral joint mobilization for flexion grade III-IV PROM to L knee into flexion in supine and sitting with tibia IR     PATIENT EDUCATION:   Education details: HEP update, HEP review, and scar tissue mobilization Person educated: Patient Education method: Explanation, Demonstration, and Handouts Education comprehension: verbalized understanding and returned demonstration  HOME EXERCISE PROGRAM: Access Code: [email or web address] URL: [email or web address] Date: 09/05/2022 Prepared by: Almyra Free  Exercises - Sit to Stand  - 3 x daily - 7 x weekly - 1-2 sets - 10 reps - Seated Long Arc Quad  -  2 x daily - 7 x weekly - 3 sets - 10 reps - 5 sec hold - Seated Knee Flexion Stretch  - 3 x daily - 7 x weekly - 2 sets - 10 reps - max hold - Seated Knee Flexion AAROM  - 3 x daily -  7 x weekly - 2 sets - 10 reps - max hold - Supine Quadriceps Stretch with Strap on Table  - 2-3 x daily - 7 x weekly - 3 reps - 30 sec hold - Supine Heel Slide with Strap  - 2-3 x daily - 7 x weekly - 2 sets - 10 reps - 3 sec hold - Long Sitting Inferior Patellar Glide  - 2 x daily - 7 x weekly - 2 sets - 10 reps - 3 sec hold - Long Sitting Superior Patellar Glide  - 2 x daily - 7 x weekly - 2 sets - 10 reps - 3 sec hold - Long Sitting Medial Patellar Glide  - 2 x daily - 7 x weekly - 2 sets - 10 reps - 3 sec hold - Long Sitting Lateral Patellar Glide  - 2 x daily - 7 x weekly - 2 sets - 10 reps - 3 sec hold - Prone Quadriceps Stretch with Strap (Mirrored)  - 2-3 x daily - 7 x weekly - 1 sets - 3 reps - 30-60 sec hold  Patient Education - Scar Massage  ASSESSMENT:  CLINICAL IMPRESSION:  Advanced patient through TherEx to improve L knee flexibility, mobility, and strength. The main focus on the session was to improve knee flexion. She continues to demonstrate a stiff knee joint, only able to achieve 84 deg of flexion after TE and MT. Will anticipate her having to get a MUA with Dr. Ninfa Linden real soon.  OBJECTIVE IMPAIRMENTS: Abnormal gait, decreased activity tolerance, decreased ROM, decreased strength, increased edema, increased muscle spasms, impaired flexibility, postural dysfunction, and pain.   ACTIVITY LIMITATIONS: sitting, standing, squatting, dressing, and locomotion level  PARTICIPATION LIMITATIONS: driving  PERSONAL FACTORS: 1-2 comorbidities: R TKR, back surgery  are also affecting patient's functional outcome.   REHAB POTENTIAL: Excellent  CLINICAL DECISION MAKING: Stable/uncomplicated  EVALUATION COMPLEXITY: Low  GOALS: Goals reviewed with patient? Yes   SHORT TERM GOALS: Target date:  08/31/2022    Independent with initial HEP. Baseline:  Goal status: MET  08/25/22  LONG TERM GOALS: Target date: 10/12/2022   Independent with advanced/ongoing HEP to improve outcomes and carryover.  Baseline:  Goal status: IN PROGRESS  2.  Kristina Burns will demonstrate L knee flexion to 115 deg to ascend/descend stairs. Baseline:  Goal status: IN PROGRESS 08/29/22  3.    Kristina Burns will be able to ambulate 600' safely with LRAD and normal gait pattern to access community.  Baseline:  Goal status: IN PROGRESS  4.  Kristina Burns will be able to ascend/descend 3 stairs with 1 HR and reciprocal step pattern safely to access home and community.  Baseline:  Goal status: IN PROGRESS  5.  Kristina Burns will demonstrate < = 25 sec on TUG to demonstrate decreased risk of falls.   Baseline: 27.08 sec with no AD Goal status: IN PROGRESS  6.  Kristina Burns will demonstrate improved functional LE strength by completing 5x STS in < 19  seconds.  (MCID 5 seconds) Baseline: 24.87 sec with bil UE support Goal status: IN PROGRESS  7.  Kristina Burns will report 51/80 on LEFS to demonstrate improved function.   Baseline: 42 / 80 = 52.5 % Goal status: IN PROGRESS   PLAN:  PT FREQUENCY: 2x/week  PT DURATION: 8 weeks  PLANNED INTERVENTIONS: Therapeutic exercises, Therapeutic activity, Neuromuscular re-education, Balance training, Gait training, Patient/Family education, Self Care, Joint mobilization, Stair training, Aquatic Therapy, Dry Needling, Electrical stimulation, Cryotherapy, Moist heat, scar mobilization,  Taping, Vasopneumatic device, and Manual therapy  PLAN FOR NEXT SESSION: Focus on L knee flexion and increase knee flexibility. Work on patellar mobs and scar tissue massage to help facilitate increase in ROM.    Artist Pais, PTA 09/12/2022, 10:01 AM

## 2022-09-14 NOTE — Therapy (Signed)
OUTPATIENT PHYSICAL THERAPY LOWER EXTREMITY TREATMENT  Progress Note  Reporting Period 08/17/2022 to 09/15/2022   See note below for Objective Data and Assessment of Progress/Goals.     Patient Name: Kristina Burns MRN: [id number] DOB:[birth year], 77 y.o., female Today's Date: 09/15/2022  END OF SESSION:  PT End of Session - 09/15/22 0845     Visit Number 10    Date for PT Re-Evaluation 10/12/22    Authorization Type BCBS MCR    Progress Note Due on Visit 20    PT Start Time 0846    PT Stop Time 0931    PT Time Calculation (min) 45 min    Activity Tolerance Patient tolerated treatment well    Behavior During Therapy [facility] for tasks assessed/performed                   Past Medical History:  Diagnosis Date   Arthritis    Asthma    High cholesterol    Past Surgical History:  Procedure Laterality Date   ABDOMINAL HYSTERECTOMY     BACK SURGERY     TOTAL KNEE ARTHROPLASTY Right 08/21/2018   Procedure: RIGHT TOTAL KNEE ARTHROPLASTY;  Surgeon: Mcarthur Rossetti, MD;  Location: [place];  Service: Orthopedics;  Laterality: Right;   TOTAL KNEE ARTHROPLASTY Left 07/14/2022   Procedure: LEFT TOTAL KNEE ARTHROPLASTY;  Surgeon: Mcarthur Rossetti, MD;  Location: [place];  Service: Orthopedics;  Laterality: Left;   Patient Active Problem List   Diagnosis Date Noted   OA (osteoarthritis) of knee 07/14/2022   Status post total left knee replacement 07/14/2022   Status post total knee replacement, right 08/21/2018   Chronic pain of both knees 07/10/2017   Unilateral primary osteoarthritis, left knee 07/10/2017    PCP: Haywood Pao, MD  REFERRING PROVIDER: Pete Pelt, PA-C  REFERRING DIAG: [phone] (ICD-10-CM) - Status post total left knee replacement   THERAPY DIAG:  Stiffness of left knee, not elsewhere classified  Acute pain of left knee  Muscle weakness (generalized)  RATIONALE FOR EVALUATION AND TREATMENT: Rehabilitation  ONSET DATE:  07/14/22  NEXT MD VISIT: 09/15/22   SUBJECTIVE:   SUBJECTIVE STATEMENT:  Pt reports continued tightness in L knee, pt sees the MD this afternoon to discuss what her plan is.   PAIN:  Are you having pain? No  PERTINENT HISTORY: R TKR, back surgery, asthma  PRECAUTIONS: None  WEIGHT BEARING RESTRICTIONS: No  FALLS:  Has patient fallen in last 6 months? No  LIVING ENVIRONMENT: Lives with: lives with their spouse Lives in: House/apartment Stairs: Yes: External: 2-3 steps; can reach both Has following equipment at home: Single point cane and Walker - 2 wheeled  OCCUPATION: retired  PLOF: Independent  PATIENT GOALS: get back to normal, wants to drive  OBJECTIVE:   DIAGNOSTIC FINDINGS: N/A  PATIENT SURVEYS:  LEFS 42/80 = 52.5% (eval); 49/80 = 61.3% (09/15/22)  COGNITION: Overall cognitive status: Within functional limits for tasks assessed     SENSATION: WFL  EDEMA:  Circumferential: R  45.5  cm;  L 49 cm  MUSCLE LENGTH: HS: mild tightness Quads:marked bil ITB: NT Piriformis: NT Hip Flexors:NT Heelcords: WFL  POSTURE: flexed trunk   PALPATION: Palpation: TTP at medial knee.   Patellar Mobility: mild decrease sup/inf  LOWER EXTREMITY ROM:   ROM Right  AROM eval Left A/PROM eval Left  A/PROM 08/22/22 Left  A/PROM 08/29/22 Left PROM 09/12/22 Left  PROM 09/15/22  Knee flexion 101 '65/75 74/80 74/85 '$ 84 76  Knee extension 0 0       (Blank rows = not tested)  LOWER EXTREMITY MMT:  MMT Right eval Left eval Left  08/29/22 Left  09/15/22  Hip flexion 4 4 4+ 4+  Hip extension  4+  4+  Hip abduction  4+  4+  Hip adduction  4  4  Hip internal rotation      Hip external rotation      Knee flexion 5 4+ in available range 4+ in available range 4+  Knee extension 5 4+ 4+ 5  Ankle dorsiflexion 5 4+  4+   (Blank rows = not tested)  FUNCTIONAL TESTS:  5 times sit to stand: 24.87 sec with bil UE support (eval), 24.31 sec w/ no UE support (09/15/22) Timed up  and go (TUG): 27.08 no AD  GAIT: Distance walked: 60 Assistive device utilized: Single point cane Level of assistance: Modified independence Comments: decreased step length and heel strike left   TODAY'S TREATMENT:                                                                                                                              DATE:    09/15/22 THERAPEUTIC EXERCISE: to improve flexibility, strength and mobility.  Verbal and tactile cues throughout for technique. RecBike x 6 min (partial revolutions)  HS Stx Supine 2x30"  Prone quad stretch w/ strap 2x30 sec Supine quad stretch with strap x 30 sec Step up 6 in forward and lateral x 10 each   THERAPEUTIC ACTIVITIES: ROM/MMT Re-Assessment  5xSTS: 24.31 w/ no UE support  LEFS: 49/80 (61.3%)  MANUAL THERAPY: To promote normalized muscle tension, improved flexibility, improved joint mobility, increased ROM, and reduced pain. MET Prone Knee Flexion 10" on, 5" off  x5    09/12/22 THERAPEUTIC EXERCISE: to improve flexibility, strength and mobility.  Bike partial revolutions x 6 min Knee extension 15# 2x10 Knee flexion 15# 2x10 Seated L knee flexion with slider and strap 10x5"  Manual Therapy: to decrease muscle spasm, pain and improve mobility.  R knee flexion PROM with patellar inferior mobilization TF joint mobilization for flexion grade III-IV   09/08/22 THERAPEUTIC EXERCISE: to improve flexibility, strength and mobility.  Bike partial revolutions x 6 min Prone quad stretch w/ strap 2x30 sec Supine quad stretch with strap x 30 sec  Manual Therapy: to decrease muscle spasm, pain and improve mobility STM and IASTM to left quads and ITB Tibiofemoral joint mobilization for flexion grade III-IV PROM to L knee into flexion in supine and sitting with tibia IR    PATIENT EDUCATION:   Education details: progress with PT, ongoing PT POC, and scar tissue mobilization Person educated: Patient Education method:  Explanation Education comprehension: verbalized understanding  HOME EXERCISE PROGRAM: Access Code: [email or web address] URL: [email or web address] Date: 09/05/2022 Prepared by: Almyra Free  Exercises - Sit to Stand  - 3 x daily - 7 x weekly - 1-2 sets - 10 reps - Seated Long  Arc Quad  - 2 x daily - 7 x weekly - 3 sets - 10 reps - 5 sec hold - Seated Knee Flexion Stretch  - 3 x daily - 7 x weekly - 2 sets - 10 reps - max hold - Seated Knee Flexion AAROM  - 3 x daily - 7 x weekly - 2 sets - 10 reps - max hold - Supine Quadriceps Stretch with Strap on Table  - 2-3 x daily - 7 x weekly - 3 reps - 30 sec hold - Supine Heel Slide with Strap  - 2-3 x daily - 7 x weekly - 2 sets - 10 reps - 3 sec hold - Long Sitting Inferior Patellar Glide  - 2 x daily - 7 x weekly - 2 sets - 10 reps - 3 sec hold - Long Sitting Superior Patellar Glide  - 2 x daily - 7 x weekly - 2 sets - 10 reps - 3 sec hold - Long Sitting Medial Patellar Glide  - 2 x daily - 7 x weekly - 2 sets - 10 reps - 3 sec hold - Long Sitting Lateral Patellar Glide  - 2 x daily - 7 x weekly - 2 sets - 10 reps - 3 sec hold - Prone Quadriceps Stretch with Strap (Mirrored)  - 2-3 x daily - 7 x weekly - 1 sets - 3 reps - 30-60 sec hold  Patient Education - Scar Massage  ASSESSMENT:  CLINICAL IMPRESSION:  Kristina "[name]" arrived to today's session with no pain but continues to note the tightness in her L knee. Pt stated that she sees the MD today and he will discuss with her what the next step is with her POC. Pt's L knee flexion is still limited and even with PROM it is difficult to get more than AROM, being limited to 76. Pt states her frustrations because she states how she just can't seem to bend her knee any more, stating that her L quadriceps and surrounding muscles are not tight but feels like the joint itself is tight. Re-assessment of MMT showed an improvement in hip flexion and knee extension. Her LTG goals of 5xSTS was assessed and she  showed a slight decrease in her time but she has not met the goal, although she was able to perform the test today with no UE assist. Pt awaits the MD's orders on whether if a manipulation is necessary and that will determine what her POC will continue to look like. She will continue to benefit from skilled PT interventions to increase her L knee ROM and work on getting her to her PLOF with decreased restrictions.   OBJECTIVE IMPAIRMENTS: Abnormal gait, decreased activity tolerance, decreased ROM, decreased strength, increased edema, increased muscle spasms, impaired flexibility, postural dysfunction, and pain.   ACTIVITY LIMITATIONS: sitting, standing, squatting, dressing, and locomotion level  PARTICIPATION LIMITATIONS: driving  PERSONAL FACTORS: 1-2 comorbidities: R TKR, back surgery  are also affecting patient's functional outcome.   REHAB POTENTIAL: Excellent  CLINICAL DECISION MAKING: Stable/uncomplicated  EVALUATION COMPLEXITY: Low  GOALS: Goals reviewed with patient? Yes   SHORT TERM GOALS: Target date: 08/31/2022    Independent with initial HEP. Baseline:  Goal status: MET  08/25/22  LONG TERM GOALS: Target date: 10/12/2022   Independent with advanced/ongoing HEP to improve outcomes and carryover.  Baseline:  Goal status: IN PROGRESS  2.  Cheila Nazir will demonstrate L knee flexion to 115 deg to ascend/descend stairs. Baseline:  Goal status: IN PROGRESS  09/15/22  3.    Golden Cawley will be able to ambulate 600' safely with LRAD and normal gait pattern to access community.  Baseline:  Goal status: IN PROGRESS  4.  Kayra Palka will be able to ascend/descend 3 stairs with 1 HR and reciprocal step pattern safely to access home and community.  Baseline:  Goal status: IN PROGRESS  5.  Alylah Sek will demonstrate </= 25 sec on TUG to demonstrate decreased risk of falls.   Baseline: 27.08 sec with no AD Goal status: IN PROGRESS  6.  Dioselina Stallsmith will  demonstrate improved functional LE strength by completing 5x STS in < 19  seconds.  (MCID 5 seconds) Baseline: 24.87 sec with bil UE support Goal status: IN PROGRESS 09/15/22 - 24.31 seconds w/o UE assist  7.  Monalisa Mccombs will report 51/80 on LEFS to demonstrate improved function.   Baseline: 42 / 80 = 52.5 % Goal status: IN PROGRESS 09/15/22 - 49/80=61.8%   PLAN:  PT FREQUENCY: 2x/week  PT DURATION: 8 weeks  PLANNED INTERVENTIONS: Therapeutic exercises, Therapeutic activity, Neuromuscular re-education, Balance training, Gait training, Patient/Family education, Self Care, Joint mobilization, Stair training, Aquatic Therapy, Dry Needling, Electrical stimulation, Cryotherapy, Moist heat, scar mobilization, Taping, Vasopneumatic device, and Manual therapy  PLAN FOR NEXT SESSION: Ask about MD visit on 09/15/22, keep focusing on L knee flexion & increase knee flexibility, MT as appropriate    Leyan Branden Romero-Perozo, Student-PT 09/15/2022, 9:45 AM

## 2022-09-15 ENCOUNTER — Encounter: Payer: Self-pay | Admitting: Physical Therapy

## 2022-09-15 ENCOUNTER — Encounter: Payer: Self-pay | Admitting: Physician Assistant

## 2022-09-15 ENCOUNTER — Ambulatory Visit: Payer: Medicare Other | Admitting: Physical Therapy

## 2022-09-15 ENCOUNTER — Ambulatory Visit (INDEPENDENT_AMBULATORY_CARE_PROVIDER_SITE_OTHER): Payer: Medicare Other | Admitting: Physician Assistant

## 2022-09-15 DIAGNOSIS — M25662 Stiffness of left knee, not elsewhere classified: Secondary | ICD-10-CM

## 2022-09-15 DIAGNOSIS — M6281 Muscle weakness (generalized): Secondary | ICD-10-CM | POA: Diagnosis not present

## 2022-09-15 DIAGNOSIS — M25562 Pain in left knee: Secondary | ICD-10-CM

## 2022-09-15 DIAGNOSIS — M24662 Ankylosis, left knee: Secondary | ICD-10-CM

## 2022-09-15 NOTE — Progress Notes (Signed)
HPI: Kristina Burns returns today for follow-up of her left total knee which was performed 07/14/2022.  She states she has not gotten the range of motion that she is wanting to get.  She has been going to therapy which is included modalities despite this has not been able to get past 90 degrees flexion.   Physical exam: Left knee surgical incisions well-healed no signs of infection.  Full extension flexion to 90 degrees both actively and passively.  Impression: Left knee arthrofibrosis History of left total knee arthroplasty 07/14/2022  Plan: Given patient's lack of range of motion despite therapy and modalities recommend left knee manipulation under anesthesia.  She is agreeable with this.  Will set her up for left knee manipulation under anesthesia near future.  Have reviewed the physical therapy that same day.  She will follow-up with [place] postop.  Questions encouraged and answered by Dr. Ninfa Linden and myself.

## 2022-09-19 ENCOUNTER — Ambulatory Visit: Payer: Medicare Other | Admitting: Physical Therapy

## 2022-09-19 ENCOUNTER — Encounter: Payer: Self-pay | Admitting: Physical Therapy

## 2022-09-19 DIAGNOSIS — M25662 Stiffness of left knee, not elsewhere classified: Secondary | ICD-10-CM

## 2022-09-19 DIAGNOSIS — M6281 Muscle weakness (generalized): Secondary | ICD-10-CM

## 2022-09-19 DIAGNOSIS — M25562 Pain in left knee: Secondary | ICD-10-CM | POA: Diagnosis not present

## 2022-09-19 NOTE — Therapy (Addendum)
OUTPATIENT PHYSICAL THERAPY TREATMENT   Patient Name: Kristina Burns MRN: [id number] DOB:02-27-1946, 77 y.o., female Today's Date: 09/19/2022  END OF SESSION:  PT End of Session - 09/19/22 0846     Visit Number 11    Date for PT Re-Evaluation 10/12/22    Authorization Type BCBS MCR    Progress Note Due on Visit 20    PT Start Time [phone]    PT Stop Time 0930    PT Time Calculation (min) 44 min    Activity Tolerance Patient tolerated treatment well    Behavior During Therapy WFL for tasks assessed/performed                   Past Medical History:  Diagnosis Date   Arthritis    Asthma    High cholesterol    Past Surgical History:  Procedure Laterality Date   ABDOMINAL HYSTERECTOMY     BACK SURGERY     TOTAL KNEE ARTHROPLASTY Right 08/21/2018   Procedure: RIGHT TOTAL KNEE ARTHROPLASTY;  Surgeon: Mcarthur Rossetti, MD;  Location: [place];  Service: Orthopedics;  Laterality: Right;   TOTAL KNEE ARTHROPLASTY Left 07/14/2022   Procedure: LEFT TOTAL KNEE ARTHROPLASTY;  Surgeon: Mcarthur Rossetti, MD;  Location: [place];  Service: Orthopedics;  Laterality: Left;   Patient Active Problem List   Diagnosis Date Noted   OA (osteoarthritis) of knee 07/14/2022   Status post total left knee replacement 07/14/2022   Status post total knee replacement, right 08/21/2018   Chronic pain of both knees 07/10/2017   Unilateral primary osteoarthritis, left knee 07/10/2017    PCP: Haywood Pao, MD  REFERRING PROVIDER: Pete Pelt, PA-C  REFERRING DIAG: [phone] (ICD-10-CM) - Status post total left knee replacement   THERAPY DIAG:  Stiffness of left knee, not elsewhere classified  Acute pain of left knee  Muscle weakness (generalized)  RATIONALE FOR EVALUATION AND TREATMENT: Rehabilitation  ONSET DATE: 07/14/22  NEXT MD VISIT: 10/06/22   SUBJECTIVE:   SUBJECTIVE STATEMENT:  Pt reports the MD wants to proceed with the MUA to address her limited L knee  flexion. MUA scheduled for Thursday 09/22/22.  PAIN:  Are you having pain? No  PERTINENT HISTORY: R TKR, back surgery, asthma  PRECAUTIONS: None  WEIGHT BEARING RESTRICTIONS: No  FALLS:  Has patient fallen in last 6 months? No  LIVING ENVIRONMENT: Lives with: lives with their spouse Lives in: House/apartment Stairs: Yes: External: 2-3 steps; can reach both Has following equipment at home: Single point cane and Walker - 2 wheeled  OCCUPATION: retired  PLOF: Independent  PATIENT GOALS: get back to normal, wants to drive  OBJECTIVE:   DIAGNOSTIC FINDINGS: N/A  PATIENT SURVEYS:  LEFS 42/80 = 52.5% (eval); 49/80 = 61.3% (09/15/22)  COGNITION: Overall cognitive status: Within functional limits for tasks assessed     SENSATION: WFL  EDEMA:  Circumferential: R  45.5  cm;  L 49 cm  MUSCLE LENGTH: HS: mild tightness Quads:marked bil ITB: NT Piriformis: NT Hip Flexors:NT Heelcords: WFL  POSTURE: flexed trunk   PALPATION: Palpation: TTP at medial knee.   Patellar Mobility: mild decrease sup/inf  LOWER EXTREMITY ROM:   ROM Right  AROM eval Left A/PROM eval Left  A/PROM 08/22/22 Left  A/PROM 08/29/22 Left PROM 09/12/22 Left  PROM 09/15/22  Knee flexion 101 65/75 74/80 74/85 $ 84 76  Knee extension 0 0       (Blank rows = not tested)  LOWER EXTREMITY MMT:  MMT Right  eval Left eval Left  08/29/22 Left  09/15/22  Hip flexion 4 4 4+ 4+  Hip extension  4+  4+  Hip abduction  4+  4+  Hip adduction  4  4  Hip internal rotation      Hip external rotation      Knee flexion 5 4+ in available range 4+ in available range 4+  Knee extension 5 4+ 4+ 5  Ankle dorsiflexion 5 4+  4+   (Blank rows = not tested)  FUNCTIONAL TESTS:  5 times sit to stand: 24.87 sec with bil UE support (eval), 24.31 sec w/ no UE support (09/15/22) Timed up and go (TUG): 27.08 no AD  GAIT: Distance walked: 60 Assistive device utilized: Single point cane Level of assistance: Modified  independence Comments: decreased step length and heel strike left   TODAY'S TREATMENT:                                                                                                                              DATE:    09/19/22 THERAPEUTIC EXERCISE: to improve flexibility, strength and mobility.  Verbal and tactile cues throughout for technique. NuStep - L5 x 6 min L knee step stretch for flexion ROM 10 x 5" Seated L heel slides with slider 10 x 5" Seated L knee AAROM with R foot overpressure 10 x 5" Seated L knee flexion stretch (fwd scoot in sitting) 5 x 5" Seated L knee flexion/extension pendulums Supine L heel slides with strap 10 x 5" Supine L knee flexion/extension at 90 hip flexion (strap supporting lower thigh) 10 x 5"   09/15/22 THERAPEUTIC EXERCISE: to improve flexibility, strength and mobility.  Verbal and tactile cues throughout for technique. RecBike x 6 min (partial revolutions)  HS Stx Supine 2x30"  Prone quad stretch w/ strap 2x30 sec Supine quad stretch with strap x 30 sec Step up 6 in forward and lateral x 10 each   THERAPEUTIC ACTIVITIES: ROM/MMT Re-Assessment  5xSTS: 24.31 w/ no UE support  LEFS: 49/80 (61.3%)  MANUAL THERAPY: To promote normalized muscle tension, improved flexibility, improved joint mobility, increased ROM, and reduced pain. MET Prone Knee Flexion 10" on, 5" off  x5    09/12/22 THERAPEUTIC EXERCISE: to improve flexibility, strength and mobility.  Bike partial revolutions x 6 min Knee extension 15# 2x10 Knee flexion 15# 2x10 Seated L knee flexion with slider and strap 10x5"  Manual Therapy: to decrease muscle spasm, pain and improve mobility.  R knee flexion PROM with patellar inferior mobilization TF joint mobilization for flexion grade III-IV   PATIENT EDUCATION:   Education details: HEP update and HEP review Person educated: Patient Education method: Explanation, Demonstration, and Handouts Education comprehension: verbalized  understanding and returned demonstration  HOME EXERCISE PROGRAM: Access Code: [email or web address] URL: [email or web address] Date: 09/19/2022 Prepared by: Annie Paras  Exercises - Sit to Stand  - 3 x daily - 7 x weekly - 1-2 sets - 10 reps -  Seated Long Arc Quad  - 2 x daily - 7 x weekly - 3 sets - 10 reps - 5 sec hold - Long Sitting Inferior Patellar Glide  - 2 x daily - 7 x weekly - 2 sets - 10 reps - 3 sec hold - Long Sitting Superior Patellar Glide  - 2 x daily - 7 x weekly - 2 sets - 10 reps - 3 sec hold - Long Sitting Medial Patellar Glide  - 2 x daily - 7 x weekly - 2 sets - 10 reps - 3 sec hold - Long Sitting Lateral Patellar Glide  - 2 x daily - 7 x weekly - 2 sets - 10 reps - 3 sec hold - Prone Quadriceps Stretch with Strap (Mirrored)  - 3 x daily - 7 x weekly - 1 sets - 3 reps - 30-60 sec hold - Supine Quadriceps Stretch with Strap on Table  - 3 x daily - 7 x weekly - 3 reps - 30-60 sec hold - Supine Heel Slide with Strap  - 3 x daily - 7 x weekly - 2 sets - 10 reps - 3 sec hold - Supine 90/90 Knee Flexion/Extension  - 3 x daily - 7 x weekly - 2 sets - 10 reps - 3 sec hold - Knee Pendulum Swings  - 3 x daily - 7 x weekly - 2 sets - 10 reps - 3 sec hold - Seated Heel Slide  - 3 x daily - 7 x weekly - 2 sets - 10 reps - 3 sec hold - Seated Knee Flexion AAROM (Mirrored)  - 3 x daily - 7 x weekly - 2 sets - 10 reps - max hold - Seated Knee Flexion Stretch (Mirrored)  - 3 x daily - 7 x weekly - 2 sets - 10 reps - max hold - Standing Knee Flexion Stretch on Step  - 3 x daily - 7 x weekly - 2 sets - 10 reps - 3 sec hold  Patient Education - Scar Massage  ASSESSMENT:  CLINICAL IMPRESSION:  Kristina "[name]" reports the MD/PA want to proceed with MUA to address her limitations in L knee flexion s/p TKA.  The MUA is scheduled for Thursday, 09/22/2022.  Today's session focusing on review of ROM and stretching exercises to improve L knee flexion for her to emphasize post procedure at  home.  Reviewed all relevant exercises and provided updated HEP handout, along with guidance on frequency of performance at home encouraging some form of knee flexion exercise at least every hour and attempting to complete total HEP approximately 3 times a day.  We reviewed sleeping positions as well as positions for sitting during the day to reduce the risk of loss of L knee extension ROM while trying to gain flexion ROM.  OBJECTIVE IMPAIRMENTS: Abnormal gait, decreased activity tolerance, decreased ROM, decreased strength, increased edema, increased muscle spasms, impaired flexibility, postural dysfunction, and pain.   ACTIVITY LIMITATIONS: sitting, standing, squatting, dressing, and locomotion level  PARTICIPATION LIMITATIONS: driving  PERSONAL FACTORS: 1-2 comorbidities: R TKR, back surgery  are also affecting patient's functional outcome.   REHAB POTENTIAL: Excellent  CLINICAL DECISION MAKING: Stable/uncomplicated  EVALUATION COMPLEXITY: Low  GOALS: Goals reviewed with patient? Yes   SHORT TERM GOALS: Target date: 08/31/2022    Independent with initial HEP. Baseline:  Goal status: MET  08/25/22  LONG TERM GOALS: Target date: 10/12/2022   Independent with advanced/ongoing HEP to improve outcomes and carryover.  Baseline:  Goal status: IN  PROGRESS  2.  Janaea Feria will demonstrate L knee flexion to 115 deg to ascend/descend stairs. Baseline:  Goal status: IN PROGRESS  09/15/22  3.    [place] will be able to ambulate 600' safely with LRAD and normal gait pattern to access community.  Baseline:  Goal status: IN PROGRESS  4.  Timiko Vasil will be able to ascend/descend 3 stairs with 1 HR and reciprocal step pattern safely to access home and community.  Baseline:  Goal status: IN PROGRESS  5.  Charny Fehnel will demonstrate </= 25 sec on TUG to demonstrate decreased risk of falls.   Baseline: 27.08 sec with no AD Goal status: IN PROGRESS  6.  Marjona Sikkema  will demonstrate improved functional LE strength by completing 5x STS in < 19  seconds.  (MCID 5 seconds) Baseline: 24.87 sec with bil UE support Goal status: IN PROGRESS 09/15/22 - 24.31 seconds w/o UE assist  7.  Wauneta Baranowski will report 51/80 on LEFS to demonstrate improved function.   Baseline: 42 / 80 = 52.5 % Goal status: IN PROGRESS 09/15/22 - 49/80=61.8%   PLAN:  PT FREQUENCY: 2x/week  PT DURATION: 8 weeks  PLANNED INTERVENTIONS: Therapeutic exercises, Therapeutic activity, Neuromuscular re-education, Balance training, Gait training, Patient/Family education, Self Care, Joint mobilization, Stair training, Aquatic Therapy, Dry Needling, Electrical stimulation, Cryotherapy, Moist heat, scar mobilization, Taping, Vasopneumatic device, and Manual therapy  PLAN FOR NEXT SESSION: Reassessment s/p L knee MUA; L knee ROM focusing on L knee flexion & increased to knee flexibility; MT as appropriate; proximal LE strengthening   Percival Spanish, PT 09/19/2022, 10:51 AM

## 2022-09-20 DIAGNOSIS — Z23 Encounter for immunization: Secondary | ICD-10-CM | POA: Diagnosis not present

## 2022-09-22 ENCOUNTER — Ambulatory Visit: Payer: Medicare Other | Admitting: Physical Therapy

## 2022-09-22 ENCOUNTER — Encounter: Payer: Self-pay | Admitting: Physical Therapy

## 2022-09-22 DIAGNOSIS — M25662 Stiffness of left knee, not elsewhere classified: Secondary | ICD-10-CM | POA: Diagnosis not present

## 2022-09-22 DIAGNOSIS — G8918 Other acute postprocedural pain: Secondary | ICD-10-CM | POA: Diagnosis not present

## 2022-09-22 DIAGNOSIS — M6281 Muscle weakness (generalized): Secondary | ICD-10-CM | POA: Diagnosis not present

## 2022-09-22 DIAGNOSIS — M24662 Ankylosis, left knee: Secondary | ICD-10-CM | POA: Diagnosis not present

## 2022-09-22 DIAGNOSIS — T8482XA Fibrosis due to internal orthopedic prosthetic devices, implants and grafts, initial encounter: Secondary | ICD-10-CM | POA: Diagnosis not present

## 2022-09-22 DIAGNOSIS — M25562 Pain in left knee: Secondary | ICD-10-CM | POA: Diagnosis not present

## 2022-09-22 NOTE — Therapy (Signed)
OUTPATIENT PHYSICAL THERAPY TREATMENT / RE-CERTIFICATION   Patient Name: Cadence Lamay MRN: [id number] DOB:06-29-1946, 77 y.o., female Today's Date: 09/22/2022  END OF SESSION:  PT End of Session - 09/22/22 1645     Visit Number 12    Date for PT Re-Evaluation 11/03/22    Authorization Type BCBS MCR    Progress Note Due on Visit 20    PT Start Time 1702    PT Stop Time 1737    PT Time Calculation (min) 35 min    Activity Tolerance Patient tolerated treatment well;Patient limited by fatigue;Patient limited by pain    Behavior During Therapy WFL for tasks assessed/performed                   Past Medical History:  Diagnosis Date   Arthritis    Asthma    High cholesterol    Past Surgical History:  Procedure Laterality Date   ABDOMINAL HYSTERECTOMY     BACK SURGERY     TOTAL KNEE ARTHROPLASTY Right 08/21/2018   Procedure: RIGHT TOTAL KNEE ARTHROPLASTY;  Surgeon: Mcarthur Rossetti, MD;  Location: [facility];  Service: Orthopedics;  Laterality: Right;   TOTAL KNEE ARTHROPLASTY Left 07/14/2022   Procedure: LEFT TOTAL KNEE ARTHROPLASTY;  Surgeon: Mcarthur Rossetti, MD;  Location: [facility];  Service: Orthopedics;  Laterality: Left;   Patient Active Problem List   Diagnosis Date Noted   OA (osteoarthritis) of knee 07/14/2022   Status post total left knee replacement 07/14/2022   Status post total knee replacement, right 08/21/2018   Chronic pain of both knees 07/10/2017   Unilateral primary osteoarthritis, left knee 07/10/2017    PCP: Haywood Pao, MD  REFERRING PROVIDER: Pete Pelt, PA-C  REFERRING DIAG: [phone] (ICD-10-CM) - Status post total left knee replacement   THERAPY DIAG:  Stiffness of left knee, not elsewhere classified  Acute pain of left knee  Muscle weakness (generalized)  RATIONALE FOR EVALUATION AND TREATMENT: Rehabilitation  ONSET DATE: 07/14/22  NEXT MD VISIT: 10/06/22   SUBJECTIVE:   SUBJECTIVE STATEMENT:  Pt  reports they gave her a nerve block for her procedure which should last for 3 days, then she can use her oxycodone as needed for pain.  PAIN:  Are you having pain? Yes: NPRS scale: 4-5/10 Pain location: superior anterior knee/distal thigh Pain description: sore  PERTINENT HISTORY: R TKR, back surgery, asthma  PRECAUTIONS: None  WEIGHT BEARING RESTRICTIONS: No  FALLS:  Has patient fallen in last 6 months? No  LIVING ENVIRONMENT: Lives with: lives with their spouse Lives in: House/apartment Stairs: Yes: External: 2-3 steps; can reach both Has following equipment at home: Single point cane and Walker - 2 wheeled  OCCUPATION: retired  PLOF: Independent  PATIENT GOALS: get back to normal, wants to drive  OBJECTIVE:   DIAGNOSTIC FINDINGS: N/A  PATIENT SURVEYS:  LEFS 42/80 = 52.5% (eval); 49/80 = 61.3% (09/15/22)  COGNITION: Overall cognitive status: Within functional limits for tasks assessed     SENSATION: WFL  EDEMA:  Circumferential: R  45.5  cm;  L 49 cm  MUSCLE LENGTH: HS: mild tightness Quads:marked bil ITB: NT Piriformis: NT Hip Flexors:NT Heelcords: WFL  POSTURE: flexed trunk   PALPATION: Palpation: TTP at medial knee.   Patellar Mobility: mild decrease sup/inf  LOWER EXTREMITY ROM:   AROM Right  eval Left  eval Left 08/22/22 Left 08/29/22 Left 09/22/22  Knee flexion 101 65 74 74 82  Knee extension 0 0   1  PROM Left eval Left 08/22/22 Left 08/29/22 Left 09/12/22 Left 09/15/22 Left 09/22/22  Knee flexion 75 80 85 84 76 82  Knee extension 0     0   (Blank rows = not tested)  LOWER EXTREMITY MMT:  MMT Right eval Left eval Left  08/29/22 Left  09/15/22  Hip flexion 4 4 4+ 4+  Hip extension  4+  4+  Hip abduction  4+  4+  Hip adduction  4  4  Hip internal rotation      Hip external rotation      Knee flexion 5 4+ in available range 4+ in available range 4+  Knee extension 5 4+ 4+ 5  Ankle dorsiflexion 5 4+  4+   (Blank rows = not  tested)  FUNCTIONAL TESTS:  5 times sit to stand: 24.87 sec with bil UE support (eval), 24.31 sec w/ no UE support (09/15/22) Timed up and go (TUG): 27.08 no AD  GAIT: Distance walked: 60 Assistive device utilized: Single point cane Level of assistance: Modified independence Comments: decreased step length and heel strike left   TODAY'S TREATMENT:                                                                                                                              DATE:    09/22/22 THERAPEUTIC EXERCISE: to improve flexibility, strength and mobility.  Verbal and tactile cues throughout for technique.  NuStep - L3 x 6 min Seated L heel slides with slider 10 x 5" Supine HS curls/AAROM L knee flexion ROM with heels on peanut ball and strap assist 2 x 10, slight assist/overpressure from PT  THERAPEUTIC ACTIVITIES: ROM assessment - see ROM table above  MANUAL THERAPY: To promote normalized muscle tension, improved flexibility, improved joint mobility, increased ROM, and reduced pain.  STM & IASTM to L quads in supine and mod thomas stretch position L knee patellar mobs/glides - all directions, emphasis on inferior/superior Gravity assisted L knee AA/PROM flexion with LE supported supported over PT's arm 10 x 5"  GAIT TRAINING: To normalize gait pattern. 68 ft with RW - cues for increased L hip and knee flexion with heel strike on weight acceptance   09/19/22 THERAPEUTIC EXERCISE: to improve flexibility, strength and mobility.  Verbal and tactile cues throughout for technique. NuStep - L5 x 6 min L knee step stretch for flexion ROM 10 x 5" Seated L heel slides with slider 10 x 5" Seated L knee AAROM with R foot overpressure 10 x 5" Seated L knee flexion stretch (fwd scoot in sitting) 5 x 5" Seated L knee flexion/extension pendulums Supine L heel slides with strap 10 x 5" Supine L knee flexion/extension at 90 hip flexion (strap supporting lower thigh) 10 x  5"   09/15/22 THERAPEUTIC EXERCISE: to improve flexibility, strength and mobility.  Verbal and tactile cues throughout for technique. RecBike x 6 min (partial revolutions)  HS Stx Supine 2x30"  Prone quad  stretch w/ strap 2x30 sec Supine quad stretch with strap x 30 sec Step up 6 in forward and lateral x 10 each   THERAPEUTIC ACTIVITIES: ROM/MMT Re-Assessment  5xSTS: 24.31 w/ no UE support  LEFS: 49/80 (61.3%)  MANUAL THERAPY: To promote normalized muscle tension, improved flexibility, improved joint mobility, increased ROM, and reduced pain. MET Prone Knee Flexion 10" on, 5" off  x5    PATIENT EDUCATION:   Education details: PT eval findings and ongoing PT POC Person educated: Patient Education method: Explanation Education comprehension: verbalized understanding  HOME EXERCISE PROGRAM: Access Code: [email or web address] URL: [email or web address] Date: 09/19/2022 Prepared by: Annie Paras  Exercises - Sit to Stand  - 3 x daily - 7 x weekly - 1-2 sets - 10 reps - Seated Long Arc Quad  - 2 x daily - 7 x weekly - 3 sets - 10 reps - 5 sec hold - Long Sitting Inferior Patellar Glide  - 2 x daily - 7 x weekly - 2 sets - 10 reps - 3 sec hold - Long Sitting Superior Patellar Glide  - 2 x daily - 7 x weekly - 2 sets - 10 reps - 3 sec hold - Long Sitting Medial Patellar Glide  - 2 x daily - 7 x weekly - 2 sets - 10 reps - 3 sec hold - Long Sitting Lateral Patellar Glide  - 2 x daily - 7 x weekly - 2 sets - 10 reps - 3 sec hold - Prone Quadriceps Stretch with Strap (Mirrored)  - 3 x daily - 7 x weekly - 1 sets - 3 reps - 30-60 sec hold - Supine Quadriceps Stretch with Strap on Table  - 3 x daily - 7 x weekly - 3 reps - 30-60 sec hold - Supine Heel Slide with Strap  - 3 x daily - 7 x weekly - 2 sets - 10 reps - 3 sec hold - Supine 90/90 Knee Flexion/Extension  - 3 x daily - 7 x weekly - 2 sets - 10 reps - 3 sec hold - Knee Pendulum Swings  - 3 x daily - 7 x weekly - 2 sets - 10 reps -  3 sec hold - Seated Heel Slide  - 3 x daily - 7 x weekly - 2 sets - 10 reps - 3 sec hold - Seated Knee Flexion AAROM (Mirrored)  - 3 x daily - 7 x weekly - 2 sets - 10 reps - max hold - Seated Knee Flexion Stretch (Mirrored)  - 3 x daily - 7 x weekly - 2 sets - 10 reps - max hold - Standing Knee Flexion Stretch on Step  - 3 x daily - 7 x weekly - 2 sets - 10 reps - 3 sec hold  Patient Education - Scar Massage  ASSESSMENT:  CLINICAL IMPRESSION:  Annalicia "[name]" underwent MUA for her L TKA this morning. She presents to PT ambulating with her RW post-procedure due to fatigue and having received a nerve block during the procedure. Her current L knee AROM 1-82 and PROM 0-82, improving from 71 on initial measurements following warm-up. Today's session focusing on gentle L knee ROM and stretching to minimize post-procedure swelling while maintaining increased ROM achieved during MUA. She reports the modifications made to her HEP last visit to focus on ROM seem to be working well, especially staggering the exercises out over the course of the day. Given today's MUA, will temporarily increase PT frequency to 3-5x/wk for the next week, resuming  2-3x/wk frequency with POC extended for up to 6 wks to work on achieving functional ROM in L knee as well as increased strength and stability to allow for safe community ambulation with normal gait pattern w/o need for AD.  OBJECTIVE IMPAIRMENTS: Abnormal gait, decreased activity tolerance, decreased ROM, decreased strength, increased edema, increased muscle spasms, impaired flexibility, postural dysfunction, and pain.   ACTIVITY LIMITATIONS: sitting, standing, squatting, dressing, and locomotion level  PARTICIPATION LIMITATIONS: driving  PERSONAL FACTORS: 1-2 comorbidities: R TKR, back surgery  are also affecting patient's functional outcome.   REHAB POTENTIAL: Excellent  CLINICAL DECISION MAKING: Stable/uncomplicated  EVALUATION COMPLEXITY:  Low  GOALS: Goals reviewed with patient? Yes   SHORT TERM GOALS: Target date: 08/31/2022    Independent with initial HEP. Baseline:  Goal status: MET  08/25/22  LONG TERM GOALS: Target date: 10/12/2022, extended to 11/03/22   Independent with advanced/ongoing HEP to improve outcomes and carryover.  Baseline:  Goal status: IN PROGRESS  09/22/22 - Met for current HEP  2.  Lajune Matranga will demonstrate L knee flexion to 115 deg to ascend/descend stairs. Baseline: AROM - 65, PROM - 75 Goal status: IN PROGRESS  09/22/22 - AROM 1-82, flexion PROM 0-82  3.    Burton Rasmussen will be able to ambulate 600' safely with LRAD and normal gait pattern to access community.  Baseline:  Goal status: IN PROGRESS  09/22/22 - more limited tolerance today due post-MUA with pt wanting to keep knee straight while walking  4.  Avanti Weathers will be able to ascend/descend 3 stairs with 1 HR and reciprocal step pattern safely to access home and community.  Baseline:  Goal status: IN PROGRESS  5.  Zoe Jayson will demonstrate </= 25 sec on TUG to demonstrate decreased risk of falls.   Baseline: 27.08 sec with no AD Goal status: IN PROGRESS  6.  Nida Nass will demonstrate improved functional LE strength by completing 5x STS in < 19  seconds.  (MCID 5 seconds) Baseline: 24.87 sec with bil UE support Goal status: IN PROGRESS 09/15/22 - 24.31 seconds w/o UE assist  7.  Terriyah Tangeman will report 51/80 on LEFS to demonstrate improved function.   Baseline: 42 / 80 = 52.5 % Goal status: IN PROGRESS 09/15/22 - 49/80=61.8%   PLAN:  PT FREQUENCY: 2-3x/week - temporarily increased to 3-5x/wk x 1 week post-MUA, then returning to 2-3x/wk  PT DURATION: 6 weeks   PLANNED INTERVENTIONS: Therapeutic exercises, Therapeutic activity, Neuromuscular re-education, Balance training, Gait training, Patient/Family education, Self Care, Joint mobilization, Stair training, Aquatic Therapy, Dry Needling, Electrical  stimulation, Cryotherapy, Moist heat, scar mobilization, Taping, Vasopneumatic device, and Manual therapy  PLAN FOR NEXT SESSION: L knee ROM/stretching focusing on L knee flexion and increased knee flexibility; MT as indicated; proximal LE strengthening   Percival Spanish, PT 09/22/2022, 6:16 PM

## 2022-09-23 ENCOUNTER — Ambulatory Visit: Payer: Medicare Other

## 2022-09-23 DIAGNOSIS — M25662 Stiffness of left knee, not elsewhere classified: Secondary | ICD-10-CM

## 2022-09-23 DIAGNOSIS — M6281 Muscle weakness (generalized): Secondary | ICD-10-CM

## 2022-09-23 DIAGNOSIS — M25562 Pain in left knee: Secondary | ICD-10-CM

## 2022-09-23 NOTE — Therapy (Signed)
OUTPATIENT PHYSICAL THERAPY TREATMENT    Patient Name: Kristina Burns MRN: [id number] DOB:[birth year], 77 y.o., female Today's Date: 09/23/2022  END OF SESSION:  PT End of Session - 09/23/22 1155     Visit Number 13    Date for PT Re-Evaluation 11/03/22    Authorization Type BCBS MCR    Progress Note Due on Visit 20    PT Start Time 1105    PT Stop Time 1147    PT Time Calculation (min) 42 min    Activity Tolerance Patient tolerated treatment well;Patient limited by pain    Behavior During Therapy WFL for tasks assessed/performed                    Past Medical History:  Diagnosis Date   Arthritis    Asthma    High cholesterol    Past Surgical History:  Procedure Laterality Date   ABDOMINAL HYSTERECTOMY     BACK SURGERY     TOTAL KNEE ARTHROPLASTY Right 08/21/2018   Procedure: RIGHT TOTAL KNEE ARTHROPLASTY;  Surgeon: Mcarthur Rossetti, MD;  Location: [facility];  Service: Orthopedics;  Laterality: Right;   TOTAL KNEE ARTHROPLASTY Left 07/14/2022   Procedure: LEFT TOTAL KNEE ARTHROPLASTY;  Surgeon: Mcarthur Rossetti, MD;  Location: [facility];  Service: Orthopedics;  Laterality: Left;   Patient Active Problem List   Diagnosis Date Noted   OA (osteoarthritis) of knee 07/14/2022   Status post total left knee replacement 07/14/2022   Status post total knee replacement, right 08/21/2018   Chronic pain of both knees 07/10/2017   Unilateral primary osteoarthritis, left knee 07/10/2017    PCP: Haywood Pao, MD  REFERRING PROVIDER: Pete Pelt, PA-C  REFERRING DIAG: [id number] (ICD-10-CM) - Status post total left knee replacement   THERAPY DIAG:  Stiffness of left knee, not elsewhere classified  Acute pain of left knee  Muscle weakness (generalized)  RATIONALE FOR EVALUATION AND TREATMENT: Rehabilitation  ONSET DATE: 07/14/22  NEXT MD VISIT: 10/06/22   SUBJECTIVE:   SUBJECTIVE STATEMENT:  Pt notes pain at about 4-5 today, yesterday she was  not feeling great but still came in.   PAIN:  Are you having pain? Yes: NPRS scale: 4-5/10 Pain location: superior anterior knee/distal thigh Pain description: sore  PERTINENT HISTORY: R TKR, back surgery, asthma  PRECAUTIONS: None  WEIGHT BEARING RESTRICTIONS: No  FALLS:  Has patient fallen in last 6 months? No  LIVING ENVIRONMENT: Lives with: lives with their spouse Lives in: House/apartment Stairs: Yes: External: 2-3 steps; can reach both Has following equipment at home: Single point cane and Walker - 2 wheeled  OCCUPATION: retired  PLOF: Independent  PATIENT GOALS: get back to normal, wants to drive  OBJECTIVE:   DIAGNOSTIC FINDINGS: N/A  PATIENT SURVEYS:  LEFS 42/80 = 52.5% (eval); 49/80 = 61.3% (09/15/22)  COGNITION: Overall cognitive status: Within functional limits for tasks assessed     SENSATION: WFL  EDEMA:  Circumferential: R  45.5  cm;  L 49 cm  MUSCLE LENGTH: HS: mild tightness Quads:marked bil ITB: NT Piriformis: NT Hip Flexors:NT Heelcords: WFL  POSTURE: flexed trunk   PALPATION: Palpation: TTP at medial knee.   Patellar Mobility: mild decrease sup/inf  LOWER EXTREMITY ROM:   AROM Right  eval Left  eval Left 08/22/22 Left 08/29/22 Left 09/22/22  Knee flexion 101 65 74 74 82  Knee extension 0 0   1    PROM Left eval Left 08/22/22 Left 08/29/22 Left 09/12/22 Left  09/15/22 Left 09/22/22  Knee flexion 75 80 85 84 76 82  Knee extension 0     0   (Blank rows = not tested)  LOWER EXTREMITY MMT:  MMT Right eval Left eval Left  08/29/22 Left  09/15/22  Hip flexion 4 4 4+ 4+  Hip extension  4+  4+  Hip abduction  4+  4+  Hip adduction  4  4  Hip internal rotation      Hip external rotation      Knee flexion 5 4+ in available range 4+ in available range 4+  Knee extension 5 4+ 4+ 5  Ankle dorsiflexion 5 4+  4+   (Blank rows = not tested)  FUNCTIONAL TESTS:  5 times sit to stand: 24.87 sec with bil UE support (eval), 24.31 sec w/ no UE  support (09/15/22) Timed up and go (TUG): 27.08 no AD  GAIT: Distance walked: 60 Assistive device utilized: Single point cane Level of assistance: Modified independence Comments: decreased step length and heel strike left   TODAY'S TREATMENT:                                                                                                                              DATE:   09/23/22 THERAPEUTIC EXERCISE: to improve flexibility, strength and mobility.  Verbal and tactile cues throughout for technique.  NuStep - L4 x 6 min Seated L knee swings with 2# x 10 Supine hang 2# x 30 sec Supine quad stretch 3x15" with strap Supine passive quad stretch by therapist 3x15"  Manual Therapy: STM to L VMO and hip adductors PROM into knee flexion Knee mobilization with patellar inferior mobs  09/22/22 THERAPEUTIC EXERCISE: to improve flexibility, strength and mobility.  Verbal and tactile cues throughout for technique.  NuStep - L3 x 6 min Seated L heel slides with slider 10 x 5" Supine HS curls/AAROM L knee flexion ROM with heels on peanut ball and strap assist 2 x 10, slight assist/overpressure from PT  THERAPEUTIC ACTIVITIES: ROM assessment - see ROM table above  MANUAL THERAPY: To promote normalized muscle tension, improved flexibility, improved joint mobility, increased ROM, and reduced pain.  STM & IASTM to L quads in supine and mod thomas stretch position L knee patellar mobs/glides - all directions, emphasis on inferior/superior Gravity assisted L knee AA/PROM flexion with LE supported supported over PT's arm 10 x 5"  GAIT TRAINING: To normalize gait pattern. 65 ft with RW - cues for increased L hip and knee flexion with heel strike on weight acceptance   09/19/22 THERAPEUTIC EXERCISE: to improve flexibility, strength and mobility.  Verbal and tactile cues throughout for technique. NuStep - L5 x 6 min L knee step stretch for flexion ROM 10 x 5" Seated L heel slides with slider 10 x  5" Seated L knee AAROM with R foot overpressure 10 x 5" Seated L knee flexion stretch (fwd scoot in sitting) 5 x 5" Seated  L knee flexion/extension pendulums Supine L heel slides with strap 10 x 5" Supine L knee flexion/extension at 90 hip flexion (strap supporting lower thigh) 10 x 5"   09/15/22 THERAPEUTIC EXERCISE: to improve flexibility, strength and mobility.  Verbal and tactile cues throughout for technique. RecBike x 6 min (partial revolutions)  HS Stx Supine 2x30"  Prone quad stretch w/ strap 2x30 sec Supine quad stretch with strap x 30 sec Step up 6 in forward and lateral x 10 each   THERAPEUTIC ACTIVITIES: ROM/MMT Re-Assessment  5xSTS: 24.31 w/ no UE support  LEFS: 49/80 (61.3%)  MANUAL THERAPY: To promote normalized muscle tension, improved flexibility, improved joint mobility, increased ROM, and reduced pain. MET Prone Knee Flexion 10" on, 5" off  x5    PATIENT EDUCATION:   Education details: PT eval findings and ongoing PT POC Person educated: Patient Education method: Explanation Education comprehension: verbalized understanding  HOME EXERCISE PROGRAM: Access Code: [email or web address] URL: [email or web address] Date: 09/19/2022 Prepared by: Annie Paras  Exercises - Sit to Stand  - 3 x daily - 7 x weekly - 1-2 sets - 10 reps - Seated Long Arc Quad  - 2 x daily - 7 x weekly - 3 sets - 10 reps - 5 sec hold - Long Sitting Inferior Patellar Glide  - 2 x daily - 7 x weekly - 2 sets - 10 reps - 3 sec hold - Long Sitting Superior Patellar Glide  - 2 x daily - 7 x weekly - 2 sets - 10 reps - 3 sec hold - Long Sitting Medial Patellar Glide  - 2 x daily - 7 x weekly - 2 sets - 10 reps - 3 sec hold - Long Sitting Lateral Patellar Glide  - 2 x daily - 7 x weekly - 2 sets - 10 reps - 3 sec hold - Prone Quadriceps Stretch with Strap (Mirrored)  - 3 x daily - 7 x weekly - 1 sets - 3 reps - 30-60 sec hold - Supine Quadriceps Stretch with Strap on Table  - 3 x daily - 7 x  weekly - 3 reps - 30-60 sec hold - Supine Heel Slide with Strap  - 3 x daily - 7 x weekly - 2 sets - 10 reps - 3 sec hold - Supine 90/90 Knee Flexion/Extension  - 3 x daily - 7 x weekly - 2 sets - 10 reps - 3 sec hold - Knee Pendulum Swings  - 3 x daily - 7 x weekly - 2 sets - 10 reps - 3 sec hold - Seated Heel Slide  - 3 x daily - 7 x weekly - 2 sets - 10 reps - 3 sec hold - Seated Knee Flexion AAROM (Mirrored)  - 3 x daily - 7 x weekly - 2 sets - 10 reps - max hold - Seated Knee Flexion Stretch (Mirrored)  - 3 x daily - 7 x weekly - 2 sets - 10 reps - max hold - Standing Knee Flexion Stretch on Step  - 3 x daily - 7 x weekly - 2 sets - 10 reps - 3 sec hold  Patient Education - Scar Massage  ASSESSMENT:  CLINICAL IMPRESSION:  Pt arrived doing better today. We were able to keep working on ROM for L knee w/o being limited by pain. Pt was able to get to 87 in sitting with PROM by therapist. Pt had c/o pain along L VMO during ROM exercises, addressed muscle tension with STM with tension found mostly  in VMO. Pt reported improved knee flexion with less pain afterwards. She would continue to benefit from skilled therapy.  OBJECTIVE IMPAIRMENTS: Abnormal gait, decreased activity tolerance, decreased ROM, decreased strength, increased edema, increased muscle spasms, impaired flexibility, postural dysfunction, and pain.   ACTIVITY LIMITATIONS: sitting, standing, squatting, dressing, and locomotion level  PARTICIPATION LIMITATIONS: driving  PERSONAL FACTORS: 1-2 comorbidities: R TKR, back surgery  are also affecting patient's functional outcome.   REHAB POTENTIAL: Excellent  CLINICAL DECISION MAKING: Stable/uncomplicated  EVALUATION COMPLEXITY: Low  GOALS: Goals reviewed with patient? Yes   SHORT TERM GOALS: Target date: 08/31/2022    Independent with initial HEP. Baseline:  Goal status: MET  08/25/22  LONG TERM GOALS: Target date: 10/12/2022, extended to 11/03/22   Independent with  advanced/ongoing HEP to improve outcomes and carryover.  Baseline:  Goal status: IN PROGRESS  09/22/22 - Met for current HEP  2.  Ladeana Phagan will demonstrate L knee flexion to 115 deg to ascend/descend stairs. Baseline: AROM - 65, PROM - 75 Goal status: IN PROGRESS  09/22/22 - AROM 1-82, flexion PROM 0-82  3.    Bronwen Heppler will be able to ambulate 600' safely with LRAD and normal gait pattern to access community.  Baseline:  Goal status: IN PROGRESS  09/22/22 - more limited tolerance today due post-MUA with pt wanting to keep knee straight while walking  4.  Vonnetta Tant will be able to ascend/descend 3 stairs with 1 HR and reciprocal step pattern safely to access home and community.  Baseline:  Goal status: IN PROGRESS  5.  Deavian Stroh will demonstrate </= 25 sec on TUG to demonstrate decreased risk of falls.   Baseline: 27.08 sec with no AD Goal status: IN PROGRESS  6.  Celyna Rapozo will demonstrate improved functional LE strength by completing 5x STS in < 19  seconds.  (MCID 5 seconds) Baseline: 24.87 sec with bil UE support Goal status: IN PROGRESS 09/15/22 - 24.31 seconds w/o UE assist  7.  Takeya Groninger will report 51/80 on LEFS to demonstrate improved function.   Baseline: 42 / 80 = 52.5 % Goal status: IN PROGRESS 09/15/22 - 49/80=61.8%   PLAN:  PT FREQUENCY: 2-3x/week - temporarily increased to 3-5x/wk x 1 week post-MUA, then returning to 2-3x/wk  PT DURATION: 6 weeks   PLANNED INTERVENTIONS: Therapeutic exercises, Therapeutic activity, Neuromuscular re-education, Balance training, Gait training, Patient/Family education, Self Care, Joint mobilization, Stair training, Aquatic Therapy, Dry Needling, Electrical stimulation, Cryotherapy, Moist heat, scar mobilization, Taping, Vasopneumatic device, and Manual therapy  PLAN FOR NEXT SESSION: L knee ROM (does better in sitting)/stretching focusing on L knee flexion and increased knee flexibility; MT as  indicated; proximal LE strengthening   Artist Pais, PTA 09/23/2022, 12:01 PM

## 2022-09-26 ENCOUNTER — Encounter: Payer: Self-pay | Admitting: Physical Therapy

## 2022-09-26 ENCOUNTER — Ambulatory Visit: Payer: Medicare Other | Admitting: Physical Therapy

## 2022-09-26 DIAGNOSIS — M25562 Pain in left knee: Secondary | ICD-10-CM

## 2022-09-26 DIAGNOSIS — M25662 Stiffness of left knee, not elsewhere classified: Secondary | ICD-10-CM

## 2022-09-26 DIAGNOSIS — M6281 Muscle weakness (generalized): Secondary | ICD-10-CM

## 2022-09-26 NOTE — Therapy (Signed)
OUTPATIENT PHYSICAL THERAPY TREATMENT    Patient Name: Kristina Burns MRN: [id number] DOB:1946-03-31, 77 y.o., female Today's Date: 09/26/2022  END OF SESSION:  PT End of Session - 09/26/22 1008     Visit Number 14    Date for PT Re-Evaluation 11/03/22    Authorization Type BCBS MCR    Progress Note Due on Visit 20    PT Start Time 1008    PT Stop Time 1055    PT Time Calculation (min) 47 min    Activity Tolerance Patient tolerated treatment well;Patient limited by pain    Behavior During Therapy WFL for tasks assessed/performed                    Past Medical History:  Diagnosis Date   Arthritis    Asthma    High cholesterol    Past Surgical History:  Procedure Laterality Date   ABDOMINAL HYSTERECTOMY     BACK SURGERY     TOTAL KNEE ARTHROPLASTY Right 08/21/2018   Procedure: RIGHT TOTAL KNEE ARTHROPLASTY;  Surgeon: Mcarthur Rossetti, MD;  Location: [facility];  Service: Orthopedics;  Laterality: Right;   TOTAL KNEE ARTHROPLASTY Left 07/14/2022   Procedure: LEFT TOTAL KNEE ARTHROPLASTY;  Surgeon: Mcarthur Rossetti, MD;  Location: [facility];  Service: Orthopedics;  Laterality: Left;   Patient Active Problem List   Diagnosis Date Noted   OA (osteoarthritis) of knee 07/14/2022   Status post total left knee replacement 07/14/2022   Status post total knee replacement, right 08/21/2018   Chronic pain of both knees 07/10/2017   Unilateral primary osteoarthritis, left knee 07/10/2017    PCP: Haywood Pao, MD  REFERRING PROVIDER: Pete Pelt, PA-C  REFERRING DIAG: [phone] (ICD-10-CM) - Status post total left knee replacement   THERAPY DIAG:  Stiffness of left knee, not elsewhere classified  Acute pain of left knee  Muscle weakness (generalized)  RATIONALE FOR EVALUATION AND TREATMENT: Rehabilitation  ONSET DATE: 07/14/22  NEXT MD VISIT: 10/06/22   SUBJECTIVE:   SUBJECTIVE STATEMENT:  Pt denies knee pain but notes pain/tightness in  thigh muscles. Gets good relief from quad stretches both mod thomas and prone.   PAIN:  Are you having pain? Yes: NPRS scale: 4/10 Pain location: medial quads/hip adductors Pain description: sore  PERTINENT HISTORY: R TKR, back surgery, asthma  PRECAUTIONS: None  WEIGHT BEARING RESTRICTIONS: No  FALLS:  Has patient fallen in last 6 months? No  LIVING ENVIRONMENT: Lives with: lives with their spouse Lives in: House/apartment Stairs: Yes: External: 2-3 steps; can reach both Has following equipment at home: Single point cane and Walker - 2 wheeled  OCCUPATION: retired  PLOF: Independent  PATIENT GOALS: get back to normal, wants to drive  OBJECTIVE:   DIAGNOSTIC FINDINGS: N/A  PATIENT SURVEYS:  LEFS 42/80 = 52.5% (eval); 49/80 = 61.3% (09/15/22)  COGNITION: Overall cognitive status: Within functional limits for tasks assessed     SENSATION: WFL  EDEMA:  Circumferential: R  45.5  cm;  L 49 cm  MUSCLE LENGTH: HS: mild tightness Quads:marked bil ITB: NT Piriformis: NT Hip Flexors:NT Heelcords: WFL  POSTURE: flexed trunk   PALPATION: Palpation: TTP at medial knee.   Patellar Mobility: mild decrease sup/inf  LOWER EXTREMITY ROM:   AROM Right  eval Left  eval Left 08/22/22 Left 08/29/22 Left 09/22/22 Left 09/26/22  Knee flexion 101 65 74 74 82 86  Knee extension 0 0   1     PROM Left eval  Left 08/22/22 Left 08/29/22 Left 09/12/22 Left 09/15/22 Left 09/22/22  Knee flexion 75 80 85 84 76 82  Knee extension 0     0   (Blank rows = not tested)  LOWER EXTREMITY MMT:  MMT Right eval Left eval Left  08/29/22 Left  09/15/22  Hip flexion 4 4 4+ 4+  Hip extension  4+  4+  Hip abduction  4+  4+  Hip adduction  4  4  Hip internal rotation      Hip external rotation      Knee flexion 5 4+ in available range 4+ in available range 4+  Knee extension 5 4+ 4+ 5  Ankle dorsiflexion 5 4+  4+   (Blank rows = not tested)  FUNCTIONAL TESTS:  5 times sit to stand: 24.87  sec with bil UE support (eval), 24.31 sec w/ no UE support (09/15/22) Timed up and go (TUG): 27.08 no AD  GAIT: Distance walked: 60 Assistive device utilized: Single point cane Level of assistance: Modified independence Comments: decreased step length and heel strike left   TODAY'S TREATMENT:                                                                                                                              DATE:    09/26/22 THERAPEUTIC EXERCISE: to improve flexibility, strength and mobility.  Verbal and tactile cues throughout for technique. Rec bike - partial revolutions x 6 min Seated L knee flexion/extension pendulums 2 x 20 with 3# ankle weight Mod thomas quad stretch 5 x 30" Seated L heel slides + R LE AAROM overpressure at end ROM flexion 10 x 5"  MANUAL THERAPY: To promote normalized muscle tension, improved flexibility, improved joint mobility, increased ROM, and reduced pain. STM/DTM to distal quads, primarily VMO & VI/RF Seated belt mobs for L knee flexion + inferior patellar glides Supine gravity assisted knee flexion with L LE supported over PT's arm + manual tibial rotation Hooklying L inferior patellar mobs Hooklying grade II-III L tibiofemoral A/P mobs for increased knee flexion   09/23/22 THERAPEUTIC EXERCISE: to improve flexibility, strength and mobility.  Verbal and tactile cues throughout for technique.  NuStep - L4 x 6 min Seated L knee swings with 2# x 10 Supine hang 2# x 30 sec Supine quad stretch 3x15" with strap Supine passive quad stretch by therapist 3x15"  Manual Therapy: STM to L VMO and hip adductors PROM into knee flexion Knee mobilization with patellar inferior mobs   09/22/22 THERAPEUTIC EXERCISE: to improve flexibility, strength and mobility.  Verbal and tactile cues throughout for technique.  NuStep - L3 x 6 min Seated L heel slides with slider 10 x 5" Supine HS curls/AAROM L knee flexion ROM with heels on peanut ball and strap  assist 2 x 10, slight assist/overpressure from PT  THERAPEUTIC ACTIVITIES: ROM assessment - see ROM table above  MANUAL THERAPY: To promote normalized muscle tension, improved flexibility, improved joint  mobility, increased ROM, and reduced pain.  STM & IASTM to L quads in supine and mod thomas stretch position L knee patellar mobs/glides - all directions, emphasis on inferior/superior Gravity assisted L knee AA/PROM flexion with LE supported supported over PT's arm 10 x 5"  GAIT TRAINING: To normalize gait pattern. 52 ft with RW - cues for increased L hip and knee flexion with heel strike on weight acceptance   PATIENT EDUCATION:   Education details: PT eval findings and ongoing PT POC Person educated: Patient Education method: Explanation Education comprehension: verbalized understanding  HOME EXERCISE PROGRAM: Access Code: [email or web address] URL: [email or web address] Date: 09/19/2022 Prepared by: Annie Paras  Exercises - Sit to Stand  - 3 x daily - 7 x weekly - 1-2 sets - 10 reps - Seated Long Arc Quad  - 2 x daily - 7 x weekly - 3 sets - 10 reps - 5 sec hold - Long Sitting Inferior Patellar Glide  - 2 x daily - 7 x weekly - 2 sets - 10 reps - 3 sec hold - Long Sitting Superior Patellar Glide  - 2 x daily - 7 x weekly - 2 sets - 10 reps - 3 sec hold - Long Sitting Medial Patellar Glide  - 2 x daily - 7 x weekly - 2 sets - 10 reps - 3 sec hold - Long Sitting Lateral Patellar Glide  - 2 x daily - 7 x weekly - 2 sets - 10 reps - 3 sec hold - Prone Quadriceps Stretch with Strap (Mirrored)  - 3 x daily - 7 x weekly - 1 sets - 3 reps - 30-60 sec hold - Supine Quadriceps Stretch with Strap on Table  - 3 x daily - 7 x weekly - 3 reps - 30-60 sec hold - Supine Heel Slide with Strap  - 3 x daily - 7 x weekly - 2 sets - 10 reps - 3 sec hold - Supine 90/90 Knee Flexion/Extension  - 3 x daily - 7 x weekly - 2 sets - 10 reps - 3 sec hold - Knee Pendulum Swings  - 3 x daily - 7 x  weekly - 2 sets - 10 reps - 3 sec hold - Seated Heel Slide  - 3 x daily - 7 x weekly - 2 sets - 10 reps - 3 sec hold - Seated Knee Flexion AAROM (Mirrored)  - 3 x daily - 7 x weekly - 2 sets - 10 reps - max hold - Seated Knee Flexion Stretch (Mirrored)  - 3 x daily - 7 x weekly - 2 sets - 10 reps - max hold - Standing Knee Flexion Stretch on Step  - 3 x daily - 7 x weekly - 2 sets - 10 reps - 3 sec hold  Patient Education - Scar Massage  ASSESSMENT:  CLINICAL IMPRESSION:  Kristina "[name]" reports L knee w/o joint pain today but notes pain/soreness in medial distal quads. Therapeutic interventions continue to focus on L knee flexion ROM with mix of MT and TE. Some limited tolerance for overpressure into L knee PROM flexion during seated and hooklying MT interventions but discomfort resolved quickly. [name] continues to require cues to bend L hip and knee during gait, therefore will plan to focus on more functional flexion activities on next visit.  OBJECTIVE IMPAIRMENTS: Abnormal gait, decreased activity tolerance, decreased ROM, decreased strength, increased edema, increased muscle spasms, impaired flexibility, postural dysfunction, and pain.   ACTIVITY LIMITATIONS: sitting, standing, squatting, dressing, and locomotion  level  PARTICIPATION LIMITATIONS: driving  PERSONAL FACTORS: 1-2 comorbidities: R TKR, back surgery  are also affecting patient's functional outcome.   REHAB POTENTIAL: Excellent  CLINICAL DECISION MAKING: Stable/uncomplicated  EVALUATION COMPLEXITY: Low  GOALS: Goals reviewed with patient? Yes   SHORT TERM GOALS: Target date: 08/31/2022    Independent with initial HEP. Baseline:  Goal status: MET  08/25/22  LONG TERM GOALS: Target date: 10/12/2022, extended to 11/03/22   Independent with advanced/ongoing HEP to improve outcomes and carryover.  Baseline:  Goal status: IN PROGRESS  09/22/22 - Met for current HEP  2.  Kristina Burns will demonstrate L knee flexion to 115  deg to ascend/descend stairs. Baseline: AROM - 65, PROM - 75 Goal status: IN PROGRESS  09/22/22 - AROM 1-82, flexion PROM 0-82  3.    Kristina Burns will be able to ambulate 600' safely with LRAD and normal gait pattern to access community.  Baseline:  Goal status: IN PROGRESS  09/22/22 - more limited tolerance today due post-MUA with pt wanting to keep knee straight while walking  4.  Kristina Burns will be able to ascend/descend 3 stairs with 1 HR and reciprocal step pattern safely to access home and community.  Baseline:  Goal status: IN PROGRESS  5.  Kristina Burns will demonstrate </= 25 sec on TUG to demonstrate decreased risk of falls.   Baseline: 27.08 sec with no AD Goal status: IN PROGRESS  6.  Kristina Burns will demonstrate improved functional LE strength by completing 5x STS in < 19  seconds.  (MCID 5 seconds) Baseline: 24.87 sec with bil UE support Goal status: IN PROGRESS 09/15/22 - 24.31 seconds w/o UE assist  7.  Kristina Burns will report 51/80 on LEFS to demonstrate improved function.   Baseline: 42 / 80 = 52.5 % Goal status: IN PROGRESS 09/15/22 - 49/80=61.8%   PLAN:  PT FREQUENCY: 2-3x/week - temporarily increased to 3-5x/wk x 1 week post-MUA, then returning to 2-3x/wk  PT DURATION: 6 weeks   PLANNED INTERVENTIONS: Therapeutic exercises, Therapeutic activity, Neuromuscular re-education, Balance training, Gait training, Patient/Family education, Self Care, Joint mobilization, Stair training, Aquatic Therapy, Dry Needling, Electrical stimulation, Cryotherapy, Moist heat, scar mobilization, Taping, Vasopneumatic device, and Manual therapy  PLAN FOR NEXT SESSION: L knee ROM (does better in sitting)/stretching focusing on L knee flexion and increased knee flexibility; MT as indicated; proximal LE strengthening   Percival Spanish, PT 09/26/2022, 11:16 AM

## 2022-09-27 ENCOUNTER — Ambulatory Visit: Payer: Medicare Other | Admitting: Physical Therapy

## 2022-09-27 ENCOUNTER — Encounter: Payer: Self-pay | Admitting: Physical Therapy

## 2022-09-27 DIAGNOSIS — M25562 Pain in left knee: Secondary | ICD-10-CM

## 2022-09-27 DIAGNOSIS — M6281 Muscle weakness (generalized): Secondary | ICD-10-CM

## 2022-09-27 DIAGNOSIS — M25662 Stiffness of left knee, not elsewhere classified: Secondary | ICD-10-CM | POA: Diagnosis not present

## 2022-09-27 NOTE — Therapy (Signed)
OUTPATIENT PHYSICAL THERAPY TREATMENT    Patient Name: Kristina Burns MRN: [id number] DOB:30-May-1946, 77 y.o., female Today's Date: 09/27/2022  END OF SESSION:  PT End of Session - 09/27/22 1106     Visit Number 15    Date for PT Re-Evaluation 11/03/22    Authorization Type BCBS MCR    Progress Note Due on Visit 20    PT Start Time 1106    PT Stop Time 1151    PT Time Calculation (min) 45 min    Activity Tolerance Patient tolerated treatment well    Behavior During Therapy WFL for tasks assessed/performed                    Past Medical History:  Diagnosis Date   Arthritis    Asthma    High cholesterol    Past Surgical History:  Procedure Laterality Date   ABDOMINAL HYSTERECTOMY     BACK SURGERY     TOTAL KNEE ARTHROPLASTY Right 08/21/2018   Procedure: RIGHT TOTAL KNEE ARTHROPLASTY;  Surgeon: Mcarthur Rossetti, MD;  Location: [facility];  Service: Orthopedics;  Laterality: Right;   TOTAL KNEE ARTHROPLASTY Left 07/14/2022   Procedure: LEFT TOTAL KNEE ARTHROPLASTY;  Surgeon: Mcarthur Rossetti, MD;  Location: [facility];  Service: Orthopedics;  Laterality: Left;   Patient Active Problem List   Diagnosis Date Noted   OA (osteoarthritis) of knee 07/14/2022   Status post total left knee replacement 07/14/2022   Status post total knee replacement, right 08/21/2018   Chronic pain of both knees 07/10/2017   Unilateral primary osteoarthritis, left knee 07/10/2017    PCP: Haywood Pao, MD  REFERRING PROVIDER: Pete Pelt, PA-C  REFERRING DIAG: [phone] (ICD-10-CM) - Status post total left knee replacement   THERAPY DIAG:  Stiffness of left knee, not elsewhere classified  Acute pain of left knee  Muscle weakness (generalized)  RATIONALE FOR EVALUATION AND TREATMENT: Rehabilitation  ONSET DATE: 07/14/22  NEXT MD VISIT: 10/06/22   SUBJECTIVE:   SUBJECTIVE STATEMENT:  Pt reports the MT yesterday seems to have helped with the muscle  soreness/tension.  PAIN:  Are you having pain? Yes: NPRS scale: 2/10 Pain location: medial quads/hip adductors Pain description: sore  PERTINENT HISTORY: R TKR, back surgery, asthma  PRECAUTIONS: None  WEIGHT BEARING RESTRICTIONS: No  FALLS:  Has patient fallen in last 6 months? No  LIVING ENVIRONMENT: Lives with: lives with their spouse Lives in: House/apartment Stairs: Yes: External: 2-3 steps; can reach both Has following equipment at home: Single point cane and Walker - 2 wheeled  OCCUPATION: retired  PLOF: Independent  PATIENT GOALS: get back to normal, wants to drive  OBJECTIVE:   DIAGNOSTIC FINDINGS: N/A  PATIENT SURVEYS:  LEFS 42/80 = 52.5% (eval); 49/80 = 61.3% (09/15/22)  COGNITION: Overall cognitive status: Within functional limits for tasks assessed     SENSATION: WFL  EDEMA:  Circumferential: R  45.5  cm;  L 49 cm  MUSCLE LENGTH: HS: mild tightness Quads:marked bil ITB: NT Piriformis: NT Hip Flexors:NT Heelcords: WFL  POSTURE: flexed trunk   PALPATION: Palpation: TTP at medial knee.   Patellar Mobility: mild decrease sup/inf  LOWER EXTREMITY ROM:   AROM Right  eval Left  eval Left 08/22/22 Left 08/29/22 Left 09/22/22 Left 09/26/22  Knee flexion 101 65 74 74 82 86  Knee extension 0 0   1     PROM Left eval Left 08/22/22 Left 08/29/22 Left 09/12/22 Left 09/15/22 Left 09/22/22  Knee  flexion 75 80 85 84 76 82  Knee extension 0     0   (Blank rows = not tested)  LOWER EXTREMITY MMT:  MMT Right eval Left eval Left  08/29/22 Left  09/15/22  Hip flexion 4 4 4+ 4+  Hip extension  4+  4+  Hip abduction  4+  4+  Hip adduction  4  4  Hip internal rotation      Hip external rotation      Knee flexion 5 4+ in available range 4+ in available range 4+  Knee extension 5 4+ 4+ 5  Ankle dorsiflexion 5 4+  4+   (Blank rows = not tested)  FUNCTIONAL TESTS:  5 times sit to stand: 24.87 sec with bil UE support (eval), 24.31 sec w/ no UE support  (09/15/22) Timed up and go (TUG): 27.08 no AD  GAIT: Distance walked: 60 Assistive device utilized: Single point cane Level of assistance: Modified independence Comments: decreased step length and heel strike left   TODAY'S TREATMENT:                                                                                                                              DATE:    09/27/22 THERAPEUTIC EXERCISE: to improve flexibility, strength and mobility.  Verbal and tactile cues throughout for technique. Rec bike - rocking/partial revolutions x 6 min to increase flexion ROM Step-stretch for L knee flexion with foot on edge of TM 10 x 5" Supported squat (hands on TM rail) 10 x 5" L/R lunge 10 x 5", B UE support - cues to increase foot spacing to allow for deeper knee bend Seated L knee Fitter leg press (1 blck/1 blue) with AAROM L knee flexion on return x 20 Standing alt hip and knee flexion with toe clears to 8" step  L fwd step-up to 8" step, focusing on bending L knee to lower R foot rather than leaning back off step x 10 L lateral step-up to 6" step, focusing on bending L knee to lower R foot rather than leaning back off step x 10  MANUAL THERAPY: To promote normalized muscle tension, improved flexibility, improved joint mobility, and increased ROM.  STM/DTM to distal quads, primarily VMO & VI/RF - less tension/tightness noted today L patellar mobs - all directions with knee straight, inf glides with knee near end flexion ROM   09/26/22 THERAPEUTIC EXERCISE: to improve flexibility, strength and mobility.  Verbal and tactile cues throughout for technique. Rec bike - partial revolutions x 6 min Seated L knee flexion/extension pendulums 2 x 20 with 3# ankle weight Mod thomas quad stretch 5 x 30" Seated L heel slides + R LE AAROM overpressure at end ROM flexion 10 x 5"  MANUAL THERAPY: To promote normalized muscle tension, improved flexibility, improved joint mobility, increased ROM, and reduced  pain. STM/DTM to distal quads, primarily VMO & VI/RF Seated belt mobs for L knee flexion + inferior  patellar glides Supine gravity assisted knee flexion with L LE supported over PT's arm + manual tibial rotation Hooklying L inferior patellar mobs Hooklying grade II-III L tibiofemoral A/P mobs for increased knee flexion   09/23/22 THERAPEUTIC EXERCISE: to improve flexibility, strength and mobility.  Verbal and tactile cues throughout for technique.  NuStep - L4 x 6 min Seated L knee swings with 2# x 10 Supine hang 2# x 30 sec Supine quad stretch 3x15" with strap Supine passive quad stretch by therapist 3x15"  Manual Therapy: STM to L VMO and hip adductors PROM into knee flexion Knee mobilization with patellar inferior mobs   PATIENT EDUCATION:   Education details: progress with PT and ongoing PT POC Person educated: Patient Education method: Explanation Education comprehension: verbalized understanding  HOME EXERCISE PROGRAM: Access Code: [email or web address] URL: [email or web address] Date: 09/19/2022 Prepared by: Annie Paras  Exercises - Sit to Stand  - 3 x daily - 7 x weekly - 1-2 sets - 10 reps - Seated Long Arc Quad  - 2 x daily - 7 x weekly - 3 sets - 10 reps - 5 sec hold - Long Sitting Inferior Patellar Glide  - 2 x daily - 7 x weekly - 2 sets - 10 reps - 3 sec hold - Long Sitting Superior Patellar Glide  - 2 x daily - 7 x weekly - 2 sets - 10 reps - 3 sec hold - Long Sitting Medial Patellar Glide  - 2 x daily - 7 x weekly - 2 sets - 10 reps - 3 sec hold - Long Sitting Lateral Patellar Glide  - 2 x daily - 7 x weekly - 2 sets - 10 reps - 3 sec hold - Prone Quadriceps Stretch with Strap (Mirrored)  - 3 x daily - 7 x weekly - 1 sets - 3 reps - 30-60 sec hold - Supine Quadriceps Stretch with Strap on Table  - 3 x daily - 7 x weekly - 3 reps - 30-60 sec hold - Supine Heel Slide with Strap  - 3 x daily - 7 x weekly - 2 sets - 10 reps - 3 sec hold - Supine 90/90 Knee  Flexion/Extension  - 3 x daily - 7 x weekly - 2 sets - 10 reps - 3 sec hold - Knee Pendulum Swings  - 3 x daily - 7 x weekly - 2 sets - 10 reps - 3 sec hold - Seated Heel Slide  - 3 x daily - 7 x weekly - 2 sets - 10 reps - 3 sec hold - Seated Knee Flexion AAROM (Mirrored)  - 3 x daily - 7 x weekly - 2 sets - 10 reps - max hold - Seated Knee Flexion Stretch (Mirrored)  - 3 x daily - 7 x weekly - 2 sets - 10 reps - max hold - Standing Knee Flexion Stretch on Step  - 3 x daily - 7 x weekly - 2 sets - 10 reps - 3 sec hold  Patient Education - Scar Massage  ASSESSMENT:  CLINICAL IMPRESSION:  Kristina "[name]" reports relief of some of the muscle tension/soreness in her distal quads following the MT last visit with pain down to a 2/10.  Shifted focus to more functional knee flexion activities and exercises today with good tolerance, but cues necessary for proper alignment and movement patterns. [name] reports less feeling of restriction with bending her knee while walking today. Rogers "[name]" will continue to benefit from skilled PT to address functional strength and  L knee ROM deficits to improve mobility, gait and activity tolerance with improved functional ROM and decreased pain interference.   OBJECTIVE IMPAIRMENTS: Abnormal gait, decreased activity tolerance, decreased ROM, decreased strength, increased edema, increased muscle spasms, impaired flexibility, postural dysfunction, and pain.   ACTIVITY LIMITATIONS: sitting, standing, squatting, dressing, and locomotion level  PARTICIPATION LIMITATIONS: driving  PERSONAL FACTORS: 1-2 comorbidities: R TKR, back surgery  are also affecting patient's functional outcome.   REHAB POTENTIAL: Excellent  CLINICAL DECISION MAKING: Stable/uncomplicated  EVALUATION COMPLEXITY: Low  GOALS: Goals reviewed with patient? Yes   SHORT TERM GOALS: Target date: 08/31/2022    Independent with initial HEP. Baseline:  Goal status: MET  08/25/22  LONG TERM GOALS:  Target date: 10/12/2022, extended to 11/03/22   Independent with advanced/ongoing HEP to improve outcomes and carryover.  Baseline:  Goal status: IN PROGRESS  09/22/22 - Met for current HEP  2.  Ciya Reaver will demonstrate L knee flexion to 115 deg to ascend/descend stairs. Baseline: AROM - 65, PROM - 75 Goal status: IN PROGRESS  09/22/22 - AROM 1-82, flexion PROM 0-82  3.    Ruthel Frentz will be able to ambulate 600' safely with LRAD and normal gait pattern to access community.  Baseline:  Goal status: IN PROGRESS  09/22/22 - more limited tolerance today due post-MUA with pt wanting to keep knee straight while walking  4.  Alyisa Miranda will be able to ascend/descend 3 stairs with 1 HR and reciprocal step pattern safely to access home and community.  Baseline:  Goal status: IN PROGRESS  5.  Devonte Hahne will demonstrate </= 25 sec on TUG to demonstrate decreased risk of falls.   Baseline: 27.08 sec with no AD Goal status: IN PROGRESS  6.  Roxy Tess will demonstrate improved functional LE strength by completing 5x STS in < 19  seconds.  (MCID 5 seconds) Baseline: 24.87 sec with bil UE support Goal status: IN PROGRESS 09/15/22 - 24.31 seconds w/o UE assist  7.  Adriah Remund will report 51/80 on LEFS to demonstrate improved function.   Baseline: 42 / 80 = 52.5 % Goal status: IN PROGRESS 09/15/22 - 49/80=61.8%   PLAN:  PT FREQUENCY: 2-3x/week - temporarily increased to 3-5x/wk x 1 week post-MUA, then returning to 2-3x/wk  PT DURATION: 6 weeks   PLANNED INTERVENTIONS: Therapeutic exercises, Therapeutic activity, Neuromuscular re-education, Balance training, Gait training, Patient/Family education, Self Care, Joint mobilization, Stair training, Aquatic Therapy, Dry Needling, Electrical stimulation, Cryotherapy, Moist heat, scar mobilization, Taping, Vasopneumatic device, and Manual therapy  PLAN FOR NEXT SESSION: L knee ROM/stretching focusing on L knee flexion and  increased knee flexibility (does better in sitting); MT as indicated; proximal LE strengthening   Percival Spanish, PT 09/27/2022, 12:04 PM

## 2022-09-27 NOTE — Therapy (Signed)
OUTPATIENT PHYSICAL THERAPY TREATMENT    Patient Name: Kristina Burns MRN: [id number] DOB:12-24-1945, 77 y.o., female Today's Date: 09/28/2022  END OF SESSION:  PT End of Session - 09/28/22 1010     Visit Number 16    Date for PT Re-Evaluation 11/03/22    Authorization Type [employer] MCR    Progress Note Due on Visit 20    PT Start Time 0930    PT Stop Time 1010    PT Time Calculation (min) 40 min    Activity Tolerance Patient limited by pain    Behavior During Therapy WFL for tasks assessed/performed   tearful and a little down after bike                    Past Medical History:  Diagnosis Date   Arthritis    Asthma    High cholesterol    Past Surgical History:  Procedure Laterality Date   ABDOMINAL HYSTERECTOMY     BACK SURGERY     TOTAL KNEE ARTHROPLASTY Right 08/21/2018   Procedure: RIGHT TOTAL KNEE ARTHROPLASTY;  Surgeon: Mcarthur Rossetti, MD;  Location: [place];  Service: Orthopedics;  Laterality: Right;   TOTAL KNEE ARTHROPLASTY Left 07/14/2022   Procedure: LEFT TOTAL KNEE ARTHROPLASTY;  Surgeon: Mcarthur Rossetti, MD;  Location: [place];  Service: Orthopedics;  Laterality: Left;   Patient Active Problem List   Diagnosis Date Noted   OA (osteoarthritis) of knee 07/14/2022   Status post total left knee replacement 07/14/2022   Status post total knee replacement, right 08/21/2018   Chronic pain of both knees 07/10/2017   Unilateral primary osteoarthritis, left knee 07/10/2017    PCP: Haywood Pao, MD  REFERRING PROVIDER: Pete Pelt, PA-C  REFERRING DIAG: [phone] (ICD-10-CM) - Status post total left knee replacement   THERAPY DIAG:  Stiffness of left knee, not elsewhere classified  Acute pain of left knee  Muscle weakness (generalized)  RATIONALE FOR EVALUATION AND TREATMENT: Rehabilitation  ONSET DATE: 07/14/22  NEXT MD VISIT: 10/06/22   SUBJECTIVE:   SUBJECTIVE STATEMENT:  Patient reports knee is feeling  better  PAIN:  Are you having pain? Yes: NPRS scale: 0/10 Pain location: medial quads/hip adductors Pain description: sore  PERTINENT HISTORY: R TKR, back surgery, asthma  PRECAUTIONS: None  WEIGHT BEARING RESTRICTIONS: No  FALLS:  Has patient fallen in last 6 months? No  LIVING ENVIRONMENT: Lives with: lives with their spouse Lives in: House/apartment Stairs: Yes: External: 2-3 steps; can reach both Has following equipment at home: Single point cane and Walker - 2 wheeled  OCCUPATION: retired  PLOF: Independent  PATIENT GOALS: get back to normal, wants to drive  OBJECTIVE:   DIAGNOSTIC FINDINGS: N/A  PATIENT SURVEYS:  LEFS 42/80 = 52.5% (eval); 49/80 = 61.3% (09/15/22)  COGNITION: Overall cognitive status: Within functional limits for tasks assessed     SENSATION: WFL  EDEMA:  Circumferential: R  45.5  cm;  L 49 cm  MUSCLE LENGTH: HS: mild tightness Quads:marked bil ITB: NT Piriformis: NT Hip Flexors:NT Heelcords: WFL  POSTURE: flexed trunk   PALPATION: Palpation: TTP at medial knee.   Patellar Mobility: mild decrease sup/inf  LOWER EXTREMITY ROM:   AROM Right  eval Left  eval Left 08/22/22 Left 08/29/22 Left 09/22/22 Left 09/26/22  Knee flexion 101 65 74 74 82 86  Knee extension 0 0   1     PROM Left eval Left 08/22/22 Left 08/29/22 Left 09/12/22 Left 09/15/22 Left 09/22/22  Knee flexion 75 80 85 84 76 82  Knee extension 0     0   (Blank rows = not tested)  LOWER EXTREMITY MMT:  MMT Right eval Left eval Left  08/29/22 Left  09/15/22  Hip flexion 4 4 4+ 4+  Hip extension  4+  4+  Hip abduction  4+  4+  Hip adduction  4  4  Hip internal rotation      Hip external rotation      Knee flexion 5 4+ in available range 4+ in available range 4+  Knee extension 5 4+ 4+ 5  Ankle dorsiflexion 5 4+  4+   (Blank rows = not tested)  FUNCTIONAL TESTS:  5 times sit to stand: 24.87 sec with bil UE support (eval), 24.31 sec w/ no UE support (09/15/22) Timed  up and go (TUG): 27.08 no AD  GAIT: Distance walked: 60 Assistive device utilized: Single point cane Level of assistance: Modified independence Comments: decreased step length and heel strike left   TODAY'S TREATMENT:                                                                                                                              DATE:    09/28/22 THERAPEUTIC EXERCISE: to improve flexibility, strength and mobility.  Verbal and tactile cues throughout for technique. Rec bike - attempted rocking/partial revolutions but difficulty today. With PT assist for increased flexion patient came close to full revolution and had severe pain, so held bike after. Patient moved to chair and rested shortly; able to resume TE with encouragement. Seated AA knee flex with foot on slider and R LE assist x 20 - measured to only 70 deg today Step-stretch for L knee flexion with foot on edge of 6 inch step x 2 min Standing HS curl at TM x 10 Standing marching to encourage hip/knee flex for amb x 10 ea  MANUAL THERAPY: To promote normalized muscle tension, improved flexibility, improved joint mobility, and increased ROM.  STM/DTM to distal and mid quads, primarily VMO & VI/RF - patient tender throughout med> lat L patellar mobs - all directions with knee straight   09/27/22 THERAPEUTIC EXERCISE: to improve flexibility, strength and mobility.  Verbal and tactile cues throughout for technique. Rec bike - rocking/partial revolutions x 6 min to increase flexion ROM Step-stretch for L knee flexion with foot on edge of TM 10 x 5" Supported squat (hands on TM rail) 10 x 5" L/R lunge 10 x 5", B UE support - cues to increase foot spacing to allow for deeper knee bend Seated L knee Fitter leg press (1 blck/1 blue) with AAROM L knee flexion on return x 20 Standing alt hip and knee flexion with toe clears to 8" step  L fwd step-up to 8" step, focusing on bending L knee to lower R foot rather than leaning back  off step x 10 L lateral step-up to 6" step, focusing on  bending L knee to lower R foot rather than leaning back off step x 10  MANUAL THERAPY: To promote normalized muscle tension, improved flexibility, improved joint mobility, and increased ROM.  STM/DTM to distal quads, primarily VMO & VI/RF - less tension/tightness noted today L patellar mobs - all directions with knee straight, inf glides with knee near end flexion ROM   09/26/22 THERAPEUTIC EXERCISE: to improve flexibility, strength and mobility.  Verbal and tactile cues throughout for technique. Rec bike - partial revolutions x 6 min Seated L knee flexion/extension pendulums 2 x 20 with 3# ankle weight Mod thomas quad stretch 5 x 30" Seated L heel slides + R LE AAROM overpressure at end ROM flexion 10 x 5"  MANUAL THERAPY: To promote normalized muscle tension, improved flexibility, improved joint mobility, increased ROM, and reduced pain. STM/DTM to distal quads, primarily VMO & VI/RF Seated belt mobs for L knee flexion + inferior patellar glides Supine gravity assisted knee flexion with L LE supported over PT's arm + manual tibial rotation Hooklying L inferior patellar mobs Hooklying grade II-III L tibiofemoral A/P mobs for increased knee flexion   09/23/22 THERAPEUTIC EXERCISE: to improve flexibility, strength and mobility.  Verbal and tactile cues throughout for technique.  NuStep - L4 x 6 min Seated L knee swings with 2# x 10 Supine hang 2# x 30 sec Supine quad stretch 3x15" with strap Supine passive quad stretch by therapist 3x15"  Manual Therapy: STM to L VMO and hip adductors PROM into knee flexion Knee mobilization with patellar inferior mobs   PATIENT EDUCATION:   Education details: progress with PT and ongoing PT POC Person educated: Patient Education method: Explanation Education comprehension: verbalized understanding  HOME EXERCISE PROGRAM: Access Code: [email or web address] URL:  [email or web address] Date: 09/19/2022 Prepared by: Annie Paras  Exercises - Sit to Stand  - 3 x daily - 7 x weekly - 1-2 sets - 10 reps - Seated Long Arc Quad  - 2 x daily - 7 x weekly - 3 sets - 10 reps - 5 sec hold - Long Sitting Inferior Patellar Glide  - 2 x daily - 7 x weekly - 2 sets - 10 reps - 3 sec hold - Long Sitting Superior Patellar Glide  - 2 x daily - 7 x weekly - 2 sets - 10 reps - 3 sec hold - Long Sitting Medial Patellar Glide  - 2 x daily - 7 x weekly - 2 sets - 10 reps - 3 sec hold - Long Sitting Lateral Patellar Glide  - 2 x daily - 7 x weekly - 2 sets - 10 reps - 3 sec hold - Prone Quadriceps Stretch with Strap (Mirrored)  - 3 x daily - 7 x weekly - 1 sets - 3 reps - 30-60 sec hold - Supine Quadriceps Stretch with Strap on Table  - 3 x daily - 7 x weekly - 3 reps - 30-60 sec hold - Supine Heel Slide with Strap  - 3 x daily - 7 x weekly - 2 sets - 10 reps - 3 sec hold - Supine 90/90 Knee Flexion/Extension  - 3 x daily - 7 x weekly - 2 sets - 10 reps - 3 sec hold - Knee Pendulum Swings  - 3 x daily - 7 x weekly - 2 sets - 10 reps - 3 sec hold - Seated Heel Slide  - 3 x daily - 7 x weekly - 2 sets - 10 reps - 3 sec hold - Seated Knee  Flexion AAROM (Mirrored)  - 3 x daily - 7 x weekly - 2 sets - 10 reps - max hold - Seated Knee Flexion Stretch (Mirrored)  - 3 x daily - 7 x weekly - 2 sets - 10 reps - max hold - Standing Knee Flexion Stretch on Step  - 3 x daily - 7 x weekly - 2 sets - 10 reps - 3 sec hold  Patient Education - Scar Massage  ASSESSMENT:  CLINICAL IMPRESSION:  [name] presented today reporting no pain. After significant pain on bike, she reported her knee was sore in the medial knee. She was able to tolerate some ROM, but overall seemed a bit discouraged. She was tearful and upset that she had been so vocal about her pain, but also was joking later on. PT's assured [name] that her response was completely normal and acceptable. She responded well to  MT, but had tenderness and tightness in quads. Encouraged her to use her massager at home as well as ice for pain as needed. Flexion was only 70 deg in sitting today but this was taken after pain increase.    OBJECTIVE IMPAIRMENTS: Abnormal gait, decreased activity tolerance, decreased ROM, decreased strength, increased edema, increased muscle spasms, impaired flexibility, postural dysfunction, and pain.   ACTIVITY LIMITATIONS: sitting, standing, squatting, dressing, and locomotion level  PARTICIPATION LIMITATIONS: driving  PERSONAL FACTORS: 1-2 comorbidities: R TKR, back surgery  are also affecting patient's functional outcome.   REHAB POTENTIAL: Excellent  CLINICAL DECISION MAKING: Stable/uncomplicated  EVALUATION COMPLEXITY: Low  GOALS: Goals reviewed with patient? Yes   SHORT TERM GOALS: Target date: 08/31/2022    Independent with initial HEP. Baseline:  Goal status: MET  08/25/22  LONG TERM GOALS: Target date: 10/12/2022, extended to 11/03/22   Independent with advanced/ongoing HEP to improve outcomes and carryover.  Baseline:  Goal status: IN PROGRESS  09/22/22 - Met for current HEP  2.  Nessie Vanaman will demonstrate L knee flexion to 115 deg to ascend/descend stairs. Baseline: AROM - 65, PROM - 75 Goal status: IN PROGRESS  09/22/22 - AROM 1-82, flexion PROM 0-82  3.    Nikia Madonna will be able to ambulate 600' safely with LRAD and normal gait pattern to access community.  Baseline:  Goal status: IN PROGRESS  09/22/22 - more limited tolerance today due post-MUA with pt wanting to keep knee straight while walking  4.  Doniece Schwegel will be able to ascend/descend 3 stairs with 1 HR and reciprocal step pattern safely to access home and community.  Baseline:  Goal status: IN PROGRESS  5.  Al Franson will demonstrate </= 25 sec on TUG to demonstrate decreased risk of falls.   Baseline: 27.08 sec with no AD Goal status: IN PROGRESS  6.  Notnamed Schmuck will  demonstrate improved functional LE strength by completing 5x STS in < 19  seconds.  (MCID 5 seconds) Baseline: 24.87 sec with bil UE support Goal status: IN PROGRESS 09/15/22 - 24.31 seconds w/o UE assist  7.  Karelys Stegemann will report 51/80 on LEFS to demonstrate improved function.   Baseline: 42 / 80 = 52.5 % Goal status: IN PROGRESS 09/15/22 - 49/80=61.8%   PLAN:  PT FREQUENCY: 2-3x/week - temporarily increased to 3-5x/wk x 1 week post-MUA, then returning to 2-3x/wk  PT DURATION: 6 weeks   PLANNED INTERVENTIONS: Therapeutic exercises, Therapeutic activity, Neuromuscular re-education, Balance training, Gait training, Patient/Family education, Self Care, Joint mobilization, Stair training, Aquatic Therapy, Dry Needling, Electrical stimulation, Cryotherapy, Moist heat,  scar mobilization, Taping, Vasopneumatic device, and Manual therapy  PLAN FOR NEXT SESSION: L knee ROM/stretching focusing on L knee flexion and increased knee flexibility (does better in sitting); MT as indicated; proximal LE strengthening   Malakie Balis, PT 09/28/2022, 10:41 AM

## 2022-09-28 ENCOUNTER — Encounter: Payer: Medicare Other | Admitting: Physical Therapy

## 2022-09-28 ENCOUNTER — Ambulatory Visit: Payer: Medicare Other | Admitting: Physical Therapy

## 2022-09-28 ENCOUNTER — Encounter: Payer: Self-pay | Admitting: Physical Therapy

## 2022-09-28 DIAGNOSIS — M25562 Pain in left knee: Secondary | ICD-10-CM | POA: Diagnosis not present

## 2022-09-28 DIAGNOSIS — M25662 Stiffness of left knee, not elsewhere classified: Secondary | ICD-10-CM | POA: Diagnosis not present

## 2022-09-28 DIAGNOSIS — M6281 Muscle weakness (generalized): Secondary | ICD-10-CM | POA: Diagnosis not present

## 2022-09-28 NOTE — Therapy (Signed)
OUTPATIENT PHYSICAL THERAPY TREATMENT    Patient Name: Kristina Burns MRN: [id number] DOB:[birth year], 77 y.o., female Today's Date: 09/29/2022  END OF SESSION:  PT End of Session - 09/29/22 0849     Visit Number 17    Date for PT Re-Evaluation 11/03/22    Authorization Type [employer] MCR    Progress Note Due on Visit 20    PT Start Time 0845    PT Stop Time 0931    PT Time Calculation (min) 46 min    Activity Tolerance Patient tolerated treatment well    Behavior During Therapy WFL for tasks assessed/performed                      Past Medical History:  Diagnosis Date   Arthritis    Asthma    High cholesterol    Past Surgical History:  Procedure Laterality Date   ABDOMINAL HYSTERECTOMY     BACK SURGERY     TOTAL KNEE ARTHROPLASTY Right 08/21/2018   Procedure: RIGHT TOTAL KNEE ARTHROPLASTY;  Surgeon: Mcarthur Rossetti, MD;  Location: [facility];  Service: Orthopedics;  Laterality: Right;   TOTAL KNEE ARTHROPLASTY Left 07/14/2022   Procedure: LEFT TOTAL KNEE ARTHROPLASTY;  Surgeon: Mcarthur Rossetti, MD;  Location: [facility];  Service: Orthopedics;  Laterality: Left;   Patient Active Problem List   Diagnosis Date Noted   OA (osteoarthritis) of knee 07/14/2022   Status post total left knee replacement 07/14/2022   Status post total knee replacement, right 08/21/2018   Chronic pain of both knees 07/10/2017   Unilateral primary osteoarthritis, left knee 07/10/2017    PCP: Haywood Pao, MD  REFERRING PROVIDER: Pete Pelt, PA-C  REFERRING DIAG: [phone] (ICD-10-CM) - Status post total left knee replacement   THERAPY DIAG:  Stiffness of left knee, not elsewhere classified  Acute pain of left knee  Muscle weakness (generalized)  RATIONALE FOR EVALUATION AND TREATMENT: Rehabilitation  ONSET DATE: 07/14/22  NEXT MD VISIT: 10/06/22   SUBJECTIVE:   SUBJECTIVE STATEMENT:   Pt reports L knee feels sore after yesterday and has been massaging  area around at home to decrease muscle tension.   PAIN:  Are you having pain? Yes: NPRS scale: 0/10 Pain location: medial quads/hip adductors Pain description: sore  PERTINENT HISTORY: R TKR, back surgery, asthma  PRECAUTIONS: None  WEIGHT BEARING RESTRICTIONS: No  FALLS:  Has patient fallen in last 6 months? No  LIVING ENVIRONMENT: Lives with: lives with their spouse Lives in: House/apartment Stairs: Yes: External: 2-3 steps; can reach both Has following equipment at home: Single point cane and Walker - 2 wheeled  OCCUPATION: retired  PLOF: Independent  PATIENT GOALS: get back to normal, wants to drive  OBJECTIVE:   DIAGNOSTIC FINDINGS: N/A  PATIENT SURVEYS:  LEFS 42/80 = 52.5% (eval); 49/80 = 61.3% (09/15/22)  COGNITION: Overall cognitive status: Within functional limits for tasks assessed     SENSATION: WFL  EDEMA:  Circumferential: R  45.5  cm;  L 49 cm  MUSCLE LENGTH: HS: mild tightness Quads:marked bil ITB: NT Piriformis: NT Hip Flexors:NT Heelcords: WFL  POSTURE: flexed trunk   PALPATION: Palpation: TTP at medial knee.   Patellar Mobility: mild decrease sup/inf  LOWER EXTREMITY ROM:   AROM Right  eval Left  eval Left 08/22/22 Left 08/29/22 Left 09/22/22 Left 09/26/22  Knee flexion 101 65 74 74 82 86  Knee extension 0 0   1     PROM Left eval Left  08/22/22 Left 08/29/22 Left 09/12/22 Left 09/15/22 Left 09/22/22  Knee flexion 75 80 85 84 76 82  Knee extension 0     0   (Blank rows = not tested)  LOWER EXTREMITY MMT:  MMT Right eval Left eval Left  08/29/22 Left  09/15/22  Hip flexion 4 4 4+ 4+  Hip extension  4+  4+  Hip abduction  4+  4+  Hip adduction  4  4  Hip internal rotation      Hip external rotation      Knee flexion 5 4+ in available range 4+ in available range 4+  Knee extension 5 4+ 4+ 5  Ankle dorsiflexion 5 4+  4+   (Blank rows = not tested)  FUNCTIONAL TESTS:  5 times sit to stand: 24.87 sec with bil UE support (eval),  24.31 sec w/ no UE support (09/15/22) Timed up and go (TUG): 27.08 no AD  GAIT: Distance walked: 60 Assistive device utilized: Single point cane Level of assistance: Modified independence Comments: decreased step length and heel strike left   TODAY'S TREATMENT:                                                                                                                              DATE:    09/29/22 THERAPEUTIC EXERCISE: to improve flexibility, strength and mobility.  Verbal and tactile cues throughout for technique. NuStep L4 x7 min Seat 8  Seated AA knee flex with foot on slider and R LE assist x 10->80 flexion  Standing marching to encourage hip/knee flex for amb x 20 each   GAIT TRAINING: To normalize gait pattern. Distance walked: 270 ft  Assistive device utilized: Single point cane Level of assistance: Modified independence Gait pattern: Left hip hike, wide BOS, and poor foot clearance- Left Comments: pt was cued for increased hip and knee flexion by encouraging her to think about stepping over a small object the floor as she walks to normalize gait pattern  MANUAL THERAPY: To promote normalized muscle tension, improved flexibility, improved joint mobility, and increased ROM.  STM/DTM to distal and mid quads, primarily VMO & VI/RF - patient tender throughout med> lat L patellar mobs - all directions with knee straight Supine gravity assisted knee flexion with L LE supported over PT's arm + manual tibial rotation   09/28/22 THERAPEUTIC EXERCISE: to improve flexibility, strength and mobility.  Verbal and tactile cues throughout for technique. Rec bike - attempted rocking/partial revolutions but difficulty today. With PT assist for increased flexion patient came close to full revolution and had severe pain, so held bike after. Patient moved to chair and rested shortly; able to resume TE with encouragement. Seated AA knee flex with foot on slider and R LE assist x 20 - measured to  only 70 deg today Step-stretch for L knee flexion with foot on edge of 6 inch step x 2 min Standing HS curl at TM x 10 Standing marching  to encourage hip/knee flex for amb x 10 ea  MANUAL THERAPY: To promote normalized muscle tension, improved flexibility, improved joint mobility, and increased ROM.  STM/DTM to distal and mid quads, primarily VMO & VI/RF - patient tender throughout med> lat L patellar mobs - all directions with knee straight   09/27/22 THERAPEUTIC EXERCISE: to improve flexibility, strength and mobility.  Verbal and tactile cues throughout for technique. Rec bike - rocking/partial revolutions x 6 min to increase flexion ROM Step-stretch for L knee flexion with foot on edge of TM 10 x 5" Supported squat (hands on TM rail) 10 x 5" L/R lunge 10 x 5", B UE support - cues to increase foot spacing to allow for deeper knee bend Seated L knee Fitter leg press (1 blck/1 blue) with AAROM L knee flexion on return x 20 Standing alt hip and knee flexion with toe clears to 8" step  L fwd step-up to 8" step, focusing on bending L knee to lower R foot rather than leaning back off step x 10 L lateral step-up to 6" step, focusing on bending L knee to lower R foot rather than leaning back off step x 10  MANUAL THERAPY: To promote normalized muscle tension, improved flexibility, improved joint mobility, and increased ROM.  STM/DTM to distal quads, primarily VMO & VI/RF - less tension/tightness noted today L patellar mobs - all directions with knee straight, inf glides with knee near end flexion ROM    PATIENT EDUCATION:   Education details: progress with PT and ongoing PT POC Person educated: Patient Education method: Explanation Education comprehension: verbalized understanding  HOME EXERCISE PROGRAM: Access Code: [email or web address] URL: [email or web address] Date: 09/19/2022 Prepared by: Annie Paras  Exercises - Sit to Stand  - 3 x daily - 7 x weekly - 1-2 sets - 10 reps -  Seated Long Arc Quad  - 2 x daily - 7 x weekly - 3 sets - 10 reps - 5 sec hold - Long Sitting Inferior Patellar Glide  - 2 x daily - 7 x weekly - 2 sets - 10 reps - 3 sec hold - Long Sitting Superior Patellar Glide  - 2 x daily - 7 x weekly - 2 sets - 10 reps - 3 sec hold - Long Sitting Medial Patellar Glide  - 2 x daily - 7 x weekly - 2 sets - 10 reps - 3 sec hold - Long Sitting Lateral Patellar Glide  - 2 x daily - 7 x weekly - 2 sets - 10 reps - 3 sec hold - Prone Quadriceps Stretch with Strap (Mirrored)  - 3 x daily - 7 x weekly - 1 sets - 3 reps - 30-60 sec hold - Supine Quadriceps Stretch with Strap on Table  - 3 x daily - 7 x weekly - 3 reps - 30-60 sec hold - Supine Heel Slide with Strap  - 3 x daily - 7 x weekly - 2 sets - 10 reps - 3 sec hold - Supine 90/90 Knee Flexion/Extension  - 3 x daily - 7 x weekly - 2 sets - 10 reps - 3 sec hold - Knee Pendulum Swings  - 3 x daily - 7 x weekly - 2 sets - 10 reps - 3 sec hold - Seated Heel Slide  - 3 x daily - 7 x weekly - 2 sets - 10 reps - 3 sec hold - Seated Knee Flexion AAROM (Mirrored)  - 3 x daily - 7 x weekly - 2 sets -  10 reps - max hold - Seated Knee Flexion Stretch (Mirrored)  - 3 x daily - 7 x weekly - 2 sets - 10 reps - max hold - Standing Knee Flexion Stretch on Step  - 3 x daily - 7 x weekly - 2 sets - 10 reps - 3 sec hold  Patient Education - Scar Massage  ASSESSMENT:  CLINICAL IMPRESSION:  Pt came into today's session with soreness but no pain after yesterday's session. Pt continues to work on her L knee flexion and states that she continues to perform STM around her knee at home to decrease muscle tension. Pt responded to MT well today and verbalized a decrease in muscle tension on the medial knee. Pt was able to get around 80 of L knee flexion when performing AROM in a seated position but continues to demonstrate tightness when trying to push for more. Pt received gait training to increase her knee flexion as she is stepping  forward with her L leg and required various verbal and tactile cues to improve the quality of gait. Pt responded well to the training as towards the end she was able to gain a better step through pattern while narrowing her BOS and decreasing her L hip hike when compared to the first attempt. Pt continues to benefit from skilled PT interventions to address her difficulty with gait and ROM deficits.   OBJECTIVE IMPAIRMENTS: Abnormal gait, decreased activity tolerance, decreased ROM, decreased strength, increased edema, increased muscle spasms, impaired flexibility, postural dysfunction, and pain.   ACTIVITY LIMITATIONS: sitting, standing, squatting, dressing, and locomotion level  PARTICIPATION LIMITATIONS: driving  PERSONAL FACTORS: 1-2 comorbidities: R TKR, back surgery  are also affecting patient's functional outcome.   REHAB POTENTIAL: Excellent  CLINICAL DECISION MAKING: Stable/uncomplicated  EVALUATION COMPLEXITY: Low  GOALS: Goals reviewed with patient? Yes   SHORT TERM GOALS: Target date: 08/31/2022    Independent with initial HEP. Baseline:  Goal status: MET  08/25/22  LONG TERM GOALS: Target date: 10/12/2022, extended to 11/03/22   Independent with advanced/ongoing HEP to improve outcomes and carryover.  Baseline:  Goal status: IN PROGRESS  09/22/22 - Met for current HEP  2.  Kristina Burns will demonstrate L knee flexion to 115 deg to ascend/descend stairs. Baseline: AROM - 65, PROM - 75 Goal status: IN PROGRESS  09/22/22 - AROM 1-82, flexion PROM 0-82  3.    Kristina Burns will be able to ambulate 600' safely with LRAD and normal gait pattern to access community.  Baseline:  Goal status: IN PROGRESS  09/29/22 - pt continues to require cues for increased L hip and knee flexion   4.  Kristina Burns will be able to ascend/descend 3 stairs with 1 HR and reciprocal step pattern safely to access home and community.  Baseline:  Goal status: IN PROGRESS  5.  Kristina  Burns will demonstrate </= 25 sec on TUG to demonstrate decreased risk of falls.   Baseline: 27.08 sec with no AD Goal status: IN PROGRESS  6.  Kristina Burns will demonstrate improved functional LE strength by completing 5x STS in < 19  seconds.  (MCID 5 seconds) Baseline: 24.87 sec with bil UE support Goal status: IN PROGRESS 09/15/22 - 24.31 seconds w/o UE assist  7.  Kristina Burns will report 51/80 on LEFS to demonstrate improved function.   Baseline: 42 / 80 = 52.5 % Goal status: IN PROGRESS 09/15/22 - 49/80=61.8%   PLAN:  PT FREQUENCY: 2-3x/week - temporarily increased to 3-5x/wk x 1  week post-MUA, then returning to 2-3x/wk  PT DURATION: 6 weeks   PLANNED INTERVENTIONS: Therapeutic exercises, Therapeutic activity, Neuromuscular re-education, Balance training, Gait training, Patient/Family education, Self Care, Joint mobilization, Stair training, Aquatic Therapy, Dry Needling, Electrical stimulation, Cryotherapy, Moist heat, scar mobilization, Taping, Vasopneumatic device, and Manual therapy  PLAN FOR NEXT SESSION: Continue gait training; L knee ROM/stretching focusing on L knee flexion and increased knee flexibility (does better in sitting) - formal L knee ROM measurement; MT as indicated; proximal LE strengthening   Kristina Burns Romero-Perozo, Student-PT 09/29/2022, 10:56 AM

## 2022-09-29 ENCOUNTER — Ambulatory Visit: Payer: Medicare Other | Admitting: Physical Therapy

## 2022-09-29 ENCOUNTER — Encounter: Payer: Self-pay | Admitting: Physical Therapy

## 2022-09-29 DIAGNOSIS — M25562 Pain in left knee: Secondary | ICD-10-CM | POA: Diagnosis not present

## 2022-09-29 DIAGNOSIS — M25662 Stiffness of left knee, not elsewhere classified: Secondary | ICD-10-CM | POA: Diagnosis not present

## 2022-09-29 DIAGNOSIS — M6281 Muscle weakness (generalized): Secondary | ICD-10-CM

## 2022-09-30 ENCOUNTER — Ambulatory Visit: Payer: Medicare Other | Admitting: Physical Therapy

## 2022-09-30 ENCOUNTER — Encounter: Payer: Medicare Other | Admitting: Physical Therapy

## 2022-09-30 ENCOUNTER — Encounter: Payer: Self-pay | Admitting: Physical Therapy

## 2022-09-30 DIAGNOSIS — M25562 Pain in left knee: Secondary | ICD-10-CM | POA: Diagnosis not present

## 2022-09-30 DIAGNOSIS — M6281 Muscle weakness (generalized): Secondary | ICD-10-CM

## 2022-09-30 DIAGNOSIS — M25662 Stiffness of left knee, not elsewhere classified: Secondary | ICD-10-CM

## 2022-09-30 NOTE — Therapy (Signed)
OUTPATIENT PHYSICAL THERAPY TREATMENT    Patient Name: Kristina Burns MRN: [id number] DOB:July 04, 1946, 77 y.o., female Today's Date: 09/30/2022  END OF SESSION:  PT End of Session - 09/30/22 0843     Visit Number 18    Date for PT Re-Evaluation 11/03/22    Authorization Type [employer] MCR    Progress Note Due on Visit 20    PT Start Time 0846    PT Stop Time 0928    PT Time Calculation (min) 42 min    Activity Tolerance Patient tolerated treatment well    Behavior During Therapy [facility] for tasks assessed/performed                      Past Medical History:  Diagnosis Date   Arthritis    Asthma    High cholesterol    Past Surgical History:  Procedure Laterality Date   ABDOMINAL HYSTERECTOMY     BACK SURGERY     TOTAL KNEE ARTHROPLASTY Right 08/21/2018   Procedure: RIGHT TOTAL KNEE ARTHROPLASTY;  Surgeon: Mcarthur Rossetti, MD;  Location: [place];  Service: Orthopedics;  Laterality: Right;   TOTAL KNEE ARTHROPLASTY Left 07/14/2022   Procedure: LEFT TOTAL KNEE ARTHROPLASTY;  Surgeon: Mcarthur Rossetti, MD;  Location: [employer];  Service: Orthopedics;  Laterality: Left;   Patient Active Problem List   Diagnosis Date Noted   OA (osteoarthritis) of knee 07/14/2022   Status post total left knee replacement 07/14/2022   Status post total knee replacement, right 08/21/2018   Chronic pain of both knees 07/10/2017   Unilateral primary osteoarthritis, left knee 07/10/2017    PCP: Haywood Pao, MD  REFERRING PROVIDER: Pete Pelt, PA-C  REFERRING DIAG: [id number] (ICD-10-CM) - Status post total left knee replacement   THERAPY DIAG:  Stiffness of left knee, not elsewhere classified  Acute pain of left knee  Muscle weakness (generalized)  RATIONALE FOR EVALUATION AND TREATMENT: Rehabilitation  ONSET DATE: 07/14/22  NEXT MD VISIT: 10/06/22   SUBJECTIVE:   SUBJECTIVE STATEMENT:  Pt reports her pain has resolved and she has already been working on her  HEP this morning.   PAIN:  Are you having pain? No  PERTINENT HISTORY: R TKR, back surgery, asthma  PRECAUTIONS: None  WEIGHT BEARING RESTRICTIONS: No  FALLS:  Has patient fallen in last 6 months? No  LIVING ENVIRONMENT: Lives with: lives with their spouse Lives in: House/apartment Stairs: Yes: External: 2-3 steps; can reach both Has following equipment at home: Single point cane and Walker - 2 wheeled  OCCUPATION: retired  PLOF: Independent  PATIENT GOALS: get back to normal, wants to drive  OBJECTIVE:   DIAGNOSTIC FINDINGS: N/A  PATIENT SURVEYS:  LEFS 42/80 = 52.5% (eval); 49/80 = 61.3% (09/15/22)  COGNITION: Overall cognitive status: Within functional limits for tasks assessed     SENSATION: WFL  EDEMA:  Circumferential: R  45.5  cm;  L 49 cm  MUSCLE LENGTH: HS: mild tightness Quads:marked bil ITB: NT Piriformis: NT Hip Flexors:NT Heelcords: WFL  POSTURE: flexed trunk   PALPATION: Palpation: TTP at medial knee.   Patellar Mobility: mild decrease sup/inf  LOWER EXTREMITY ROM:   AROM Right  eval Left  eval Left 08/22/22 Left 08/29/22 Left 09/22/22 Left 09/26/22 Left 09/30/22  Knee flexion 101 65 74 74 82 86 88  Knee extension 0 0   1  0    PROM Left eval Left 08/22/22 Left 08/29/22 Left 09/12/22 Left 09/15/22 Left 09/22/22 Left 09/30/22  Knee flexion 75 80 85 84 76 82 91  Knee extension 0     0 0   (Blank rows = not tested)  LOWER EXTREMITY MMT:  MMT Right eval Left eval Left  08/29/22 Left  09/15/22  Hip flexion 4 4 4+ 4+  Hip extension  4+  4+  Hip abduction  4+  4+  Hip adduction  4  4  Hip internal rotation      Hip external rotation      Knee flexion 5 4+ in available range 4+ in available range 4+  Knee extension 5 4+ 4+ 5  Ankle dorsiflexion 5 4+  4+   (Blank rows = not tested)  FUNCTIONAL TESTS:  5 times sit to stand: 24.87 sec with bil UE support (eval), 24.31 sec w/ no UE support (09/15/22) Timed up and go (TUG): 27.08 no  AD  GAIT: Distance walked: 60 Assistive device utilized: Single point cane Level of assistance: Modified independence Comments: decreased step length and heel strike left   TODAY'S TREATMENT:                                                                                                                              DATE:    09/30/22 THERAPEUTIC EXERCISE: to improve flexibility, strength and mobility.  Verbal and tactile cues throughout for technique.  NuStep L4 x 7 min (Seat 8, moved up to 7 after 4 minutes to increase knee flexion)  L fwd step-over 1/2 FR x 20 - targeting L hip and knee flexion + DF for increased foot clearance and more normal gait pattern Seated L knee flex/ext and CW/CCW pendulums with 3# cuff wt on ankle x 20 each Seated L knee flexion AROM heel slides + R LE overpressure at end ROM flexion 10 x 5"  GAIT TRAINING: To normalize gait pattern.  180 ft w/o AD - cues for increased L hip and knee flexion w/o hip hike to improve L LE advancement for normalized gait pattern  THERAPEUTIC ACTIVITIES:  ROM assessment TUG = 15.50 sec w/o AD  MANUAL THERAPY: To promote improved flexibility, improved joint mobility, and increased ROM.  Seated belt mobs for L knee flexion + L patellar inferior glide MWM Contract/relax into L knee flexion in sitting at edge of mat table with PT providing slight longitudinal distraction   09/29/22 THERAPEUTIC EXERCISE: to improve flexibility, strength and mobility.  Verbal and tactile cues throughout for technique. NuStep L4 x7 min Seat 8  Seated AA knee flex with foot on slider and R LE assist x 10->80 flexion  Standing marching to encourage hip/knee flex for amb x 20 each   GAIT TRAINING: To normalize gait pattern. Distance walked: 270 ft  Assistive device utilized: Single point cane Level of assistance: Modified independence Gait pattern: Left hip hike, wide BOS, and poor foot clearance- Left Comments: pt was cued for increased hip and  knee flexion by encouraging her to think  about stepping over a small object the floor as she walks to normalize gait pattern  MANUAL THERAPY: To promote normalized muscle tension, improved flexibility, improved joint mobility, and increased ROM.  STM/DTM to distal and mid quads, primarily VMO & VI/RF - patient tender throughout med> lat L patellar mobs - all directions with knee straight Supine gravity assisted knee flexion with L LE supported over PT's arm + manual tibial rotation   09/28/22 THERAPEUTIC EXERCISE: to improve flexibility, strength and mobility.  Verbal and tactile cues throughout for technique. Rec bike - attempted rocking/partial revolutions but difficulty today. With PT assist for increased flexion patient came close to full revolution and had severe pain, so held bike after. Patient moved to chair and rested shortly; able to resume TE with encouragement. Seated AA knee flex with foot on slider and R LE assist x 20 - measured to only 70 deg today Step-stretch for L knee flexion with foot on edge of 6 inch step x 2 min Standing HS curl at TM x 10 Standing marching to encourage hip/knee flex for amb x 10 ea  MANUAL THERAPY: To promote normalized muscle tension, improved flexibility, improved joint mobility, and increased ROM.  STM/DTM to distal and mid quads, primarily VMO & VI/RF - patient tender throughout med> lat L patellar mobs - all directions with knee straight   PATIENT EDUCATION:   Education details: progress with PT and ongoing PT POC Person educated: Patient Education method: Explanation Education comprehension: verbalized understanding  HOME EXERCISE PROGRAM: Access Code: [email or web address] URL: [email or web address] Date: 09/19/2022 Prepared by: Annie Paras  Exercises - Sit to Stand  - 3 x daily - 7 x weekly - 1-2 sets - 10 reps - Seated Long Arc Quad  - 2 x daily - 7 x weekly - 3 sets - 10 reps - 5 sec hold - Long Sitting Inferior Patellar Glide   - 2 x daily - 7 x weekly - 2 sets - 10 reps - 3 sec hold - Long Sitting Superior Patellar Glide  - 2 x daily - 7 x weekly - 2 sets - 10 reps - 3 sec hold - Long Sitting Medial Patellar Glide  - 2 x daily - 7 x weekly - 2 sets - 10 reps - 3 sec hold - Long Sitting Lateral Patellar Glide  - 2 x daily - 7 x weekly - 2 sets - 10 reps - 3 sec hold - Prone Quadriceps Stretch with Strap (Mirrored)  - 3 x daily - 7 x weekly - 1 sets - 3 reps - 30-60 sec hold - Supine Quadriceps Stretch with Strap on Table  - 3 x daily - 7 x weekly - 3 reps - 30-60 sec hold - Supine Heel Slide with Strap  - 3 x daily - 7 x weekly - 2 sets - 10 reps - 3 sec hold - Supine 90/90 Knee Flexion/Extension  - 3 x daily - 7 x weekly - 2 sets - 10 reps - 3 sec hold - Knee Pendulum Swings  - 3 x daily - 7 x weekly - 2 sets - 10 reps - 3 sec hold - Seated Heel Slide  - 3 x daily - 7 x weekly - 2 sets - 10 reps - 3 sec hold - Seated Knee Flexion AAROM (Mirrored)  - 3 x daily - 7 x weekly - 2 sets - 10 reps - max hold - Seated Knee Flexion Stretch (Mirrored)  - 3 x daily - 7 x  weekly - 2 sets - 10 reps - max hold - Standing Knee Flexion Stretch on Step  - 3 x daily - 7 x weekly - 2 sets - 10 reps - 3 sec hold  Patient Education - Scar Massage  ASSESSMENT:  CLINICAL IMPRESSION:  Kristina "[name]" reports pain/soreness from over-flexion of her L knee while on the recumbent bike earlier this week is now resolved. L knee ROM continues to improve with AROM today 0-88 and PROM 0-91 following MT. Gait training focusing on increasing L hip and knee flexion during swing through while avoiding L hip hike to normalize gait pattern. TUG time has improved to 15.5 sec, meeting LTG #5 but continued potential for improvement to below fall risk threshold of 13.5 sec still possible, therefore goal updated. [name] will continue benefit from skilled PT to address above deficits to improve L knee ROM and LE strength for increased mobility and activity tolerance  to allow for normal gait and stair mechanics. Will resume 2-3x/wk frequency as of next week.  OBJECTIVE IMPAIRMENTS: Abnormal gait, decreased activity tolerance, decreased ROM, decreased strength, increased edema, increased muscle spasms, impaired flexibility, postural dysfunction, and pain.   ACTIVITY LIMITATIONS: sitting, standing, squatting, dressing, and locomotion level  PARTICIPATION LIMITATIONS: driving  PERSONAL FACTORS: 1-2 comorbidities: R TKR, back surgery  are also affecting patient's functional outcome.   REHAB POTENTIAL: Excellent  CLINICAL DECISION MAKING: Stable/uncomplicated  EVALUATION COMPLEXITY: Low  GOALS: Goals reviewed with patient? Yes   SHORT TERM GOALS: Target date: 08/31/2022    Independent with initial HEP. Baseline:  Goal status: MET  08/25/22  LONG TERM GOALS: Target date: 10/12/2022, extended to 11/03/22   Independent with advanced/ongoing HEP to improve outcomes and carryover.  Baseline:  Goal status: IN PROGRESS  09/30/22 - Met for current HEP  2.  Gayane Vandermeer will demonstrate L knee flexion to 115 deg to ascend/descend stairs. Baseline: AROM - 65, PROM - 75 Goal status: IN PROGRESS  09/30/22 - AROM 0-88, flexion PROM 0-91  3.    Dot Dingee will be able to ambulate 600' safely with LRAD and normal gait pattern to access community.  Baseline:  Goal status: IN PROGRESS  09/29/22 - pt continues to require cues for increased L hip and knee flexion and avoidance of L hip hike  4.  Bahar Char will be able to ascend/descend 3 stairs with 1 HR and reciprocal step pattern safely to access home and community.  Baseline:  Goal status: IN PROGRESS  5.  Milania Overacker will demonstrate </= 25 sec on TUG to demonstrate decreased risk of falls.   Baseline: 27.08 sec with no AD Goal status: MET and REVISED (See 5a below)  09/30/22 - 15.5 sec  5a.  Patient will demonstrate decreased TUG time to </= 13.5 sec to decrease risk for falls with  transitional mobility. Baseline: 15.5 sec w/o AD (09/30/22) Goal status: INITIAL    6.  Brita Zwiebel will demonstrate improved functional LE strength by completing 5x STS in < 19  seconds.  (MCID 5 seconds) Baseline: 24.87 sec with bil UE support Goal status: IN PROGRESS 09/15/22 - 24.31 seconds w/o UE assist  7.  Verlin Raetz will report 51/80 on LEFS to demonstrate improved function.   Baseline: 42 / 80 = 52.5 % Goal status: IN PROGRESS 09/15/22 - 49/80=61.8%   PLAN:  PT FREQUENCY: 2-3x/week   PT DURATION: 6 weeks   PLANNED INTERVENTIONS: Therapeutic exercises, Therapeutic activity, Neuromuscular re-education, Balance training, Gait training, Patient/Family education, Self  Care, Joint mobilization, Stair training, Aquatic Therapy, Dry Needling, Electrical stimulation, Cryotherapy, Moist heat, scar mobilization, Taping, Vasopneumatic device, and Manual therapy  PLAN FOR NEXT SESSION: L knee ROM/stretching focusing on L knee flexion and increased knee flexibility (does better in sitting); MT as indicated; proximal LE strengthening; gait training to normalize gait pattern as indicated    Percival Spanish, PT 09/30/2022, 10:02 AM

## 2022-10-03 ENCOUNTER — Ambulatory Visit: Payer: Medicare Other

## 2022-10-03 DIAGNOSIS — M25562 Pain in left knee: Secondary | ICD-10-CM | POA: Diagnosis not present

## 2022-10-03 DIAGNOSIS — M6281 Muscle weakness (generalized): Secondary | ICD-10-CM | POA: Diagnosis not present

## 2022-10-03 DIAGNOSIS — M25662 Stiffness of left knee, not elsewhere classified: Secondary | ICD-10-CM | POA: Diagnosis not present

## 2022-10-03 NOTE — Therapy (Signed)
OUTPATIENT PHYSICAL THERAPY TREATMENT    Patient Name: Cylie Denys MRN: [id number] DOB:Apr 20, 1946, 77 y.o., female Today's Date: 10/03/2022  END OF SESSION:  PT End of Session - 10/03/22 1101     Visit Number 19    Date for PT Re-Evaluation 11/03/22    Authorization Type [employer] MCR    Progress Note Due on Visit 20    PT Start Time 1016    PT Stop Time 1059    PT Time Calculation (min) 43 min    Activity Tolerance Patient tolerated treatment well    Behavior During Therapy WFL for tasks assessed/performed                      Past Medical History:  Diagnosis Date   Arthritis    Asthma    High cholesterol    Past Surgical History:  Procedure Laterality Date   ABDOMINAL HYSTERECTOMY     BACK SURGERY     TOTAL KNEE ARTHROPLASTY Right 08/21/2018   Procedure: RIGHT TOTAL KNEE ARTHROPLASTY;  Surgeon: Mcarthur Rossetti, MD;  Location: [place];  Service: Orthopedics;  Laterality: Right;   TOTAL KNEE ARTHROPLASTY Left 07/14/2022   Procedure: LEFT TOTAL KNEE ARTHROPLASTY;  Surgeon: Mcarthur Rossetti, MD;  Location: [place];  Service: Orthopedics;  Laterality: Left;   Patient Active Problem List   Diagnosis Date Noted   OA (osteoarthritis) of knee 07/14/2022   Status post total left knee replacement 07/14/2022   Status post total knee replacement, right 08/21/2018   Chronic pain of both knees 07/10/2017   Unilateral primary osteoarthritis, left knee 07/10/2017    PCP: Haywood Pao, MD  REFERRING PROVIDER: Pete Pelt, PA-C  REFERRING DIAG: [id number] (ICD-10-CM) - Status post total left knee replacement   THERAPY DIAG:  Stiffness of left knee, not elsewhere classified  Acute pain of left knee  Muscle weakness (generalized)  RATIONALE FOR EVALUATION AND TREATMENT: Rehabilitation  ONSET DATE: 07/14/22  NEXT MD VISIT: 10/06/22   SUBJECTIVE:   SUBJECTIVE STATEMENT:  No pain, just trying to bend to 100 deg this week."  PAIN:  Are you  having pain? No  PERTINENT HISTORY: R TKR, back surgery, asthma  PRECAUTIONS: None  WEIGHT BEARING RESTRICTIONS: No  FALLS:  Has patient fallen in last 6 months? No  LIVING ENVIRONMENT: Lives with: lives with their spouse Lives in: House/apartment Stairs: Yes: External: 2-3 steps; can reach both Has following equipment at home: Single point cane and Walker - 2 wheeled  OCCUPATION: retired  PLOF: Independent  PATIENT GOALS: get back to normal, wants to drive  OBJECTIVE:   DIAGNOSTIC FINDINGS: N/A  PATIENT SURVEYS:  LEFS 42/80 = 52.5% (eval); 49/80 = 61.3% (09/15/22)  COGNITION: Overall cognitive status: Within functional limits for tasks assessed     SENSATION: WFL  EDEMA:  Circumferential: R  45.5  cm;  L 49 cm  MUSCLE LENGTH: HS: mild tightness Quads:marked bil ITB: NT Piriformis: NT Hip Flexors:NT Heelcords: WFL  POSTURE: flexed trunk   PALPATION: Palpation: TTP at medial knee.   Patellar Mobility: mild decrease sup/inf  LOWER EXTREMITY ROM:   AROM Right  eval Left  eval Left 08/22/22 Left 08/29/22 Left 09/22/22 Left 09/26/22 Left 09/30/22  Knee flexion 101 65 74 74 82 86 88  Knee extension 0 0   1  0    PROM Left eval Left 08/22/22 Left 08/29/22 Left 09/12/22 Left 09/15/22 Left 09/22/22 Left 09/30/22  Knee flexion 75 80 85 84  76 82 91  Knee extension 0     0 0   (Blank rows = not tested)  LOWER EXTREMITY MMT:  MMT Right eval Left eval Left  08/29/22 Left  09/15/22  Hip flexion 4 4 4+ 4+  Hip extension  4+  4+  Hip abduction  4+  4+  Hip adduction  4  4  Hip internal rotation      Hip external rotation      Knee flexion 5 4+ in available range 4+ in available range 4+  Knee extension 5 4+ 4+ 5  Ankle dorsiflexion 5 4+  4+   (Blank rows = not tested)  FUNCTIONAL TESTS:  5 times sit to stand: 24.87 sec with bil UE support (eval), 24.31 sec w/ no UE support (09/15/22) Timed up and go (TUG): 27.08 no AD  GAIT: Distance walked: 60 Assistive device  utilized: Single point cane Level of assistance: Modified independence Comments: decreased step length and heel strike left   TODAY'S TREATMENT:                                                                                                                              DATE:   10/03/22 THERAPEUTIC EXERCISE: to improve flexibility, strength and mobility.  Verbal and tactile cues throughout for technique.  NuStep L3 x 7 min - moved seat up to increase flexion Seated L knee pendulum swing w/ 5# weight x 20 Supine hang into knee flexion 5# x 1, 4# x 2; 5-10 sec hold  MANUAL THERAPY: To promote improved flexibility, improved joint mobility, and increased ROM.  Seated L knee flexion + L patellar inferior glide MWM Seated L knee flexion w/ joint distraction Contract/relax into L knee flexion in sitting at edge of mat table with PT providing slight longitudinal distraction 09/30/22 THERAPEUTIC EXERCISE: to improve flexibility, strength and mobility.  Verbal and tactile cues throughout for technique.  NuStep L4 x 7 min (Seat 8, moved up to 7 after 4 minutes to increase knee flexion)  L fwd step-over 1/2 FR x 20 - targeting L hip and knee flexion + DF for increased foot clearance and more normal gait pattern Seated L knee flex/ext and CW/CCW pendulums with 3# cuff wt on ankle x 20 each Seated L knee flexion AROM heel slides + R LE overpressure at end ROM flexion 10 x 5"  GAIT TRAINING: To normalize gait pattern.  180 ft w/o AD - cues for increased L hip and knee flexion w/o hip hike to improve L LE advancement for normalized gait pattern  THERAPEUTIC ACTIVITIES:  ROM assessment TUG = 15.50 sec w/o AD  MANUAL THERAPY: To promote improved flexibility, improved joint mobility, and increased ROM.  Seated belt mobs for L knee flexion + L patellar inferior glide MWM Contract/relax into L knee flexion in sitting at edge of mat table with PT providing slight longitudinal  distraction   09/29/22 THERAPEUTIC EXERCISE: to improve flexibility,  strength and mobility.  Verbal and tactile cues throughout for technique. NuStep L4 x7 min Seat 8  Seated AA knee flex with foot on slider and R LE assist x 10->80 flexion  Standing marching to encourage hip/knee flex for amb x 20 each   GAIT TRAINING: To normalize gait pattern. Distance walked: 270 ft  Assistive device utilized: Single point cane Level of assistance: Modified independence Gait pattern: Left hip hike, wide BOS, and poor foot clearance- Left Comments: pt was cued for increased hip and knee flexion by encouraging her to think about stepping over a small object the floor as she walks to normalize gait pattern  MANUAL THERAPY: To promote normalized muscle tension, improved flexibility, improved joint mobility, and increased ROM.  STM/DTM to distal and mid quads, primarily VMO & VI/RF - patient tender throughout med> lat L patellar mobs - all directions with knee straight Supine gravity assisted knee flexion with L LE supported over PT's arm + manual tibial rotation   09/28/22 THERAPEUTIC EXERCISE: to improve flexibility, strength and mobility.  Verbal and tactile cues throughout for technique. Rec bike - attempted rocking/partial revolutions but difficulty today. With PT assist for increased flexion patient came close to full revolution and had severe pain, so held bike after. Patient moved to chair and rested shortly; able to resume TE with encouragement. Seated AA knee flex with foot on slider and R LE assist x 20 - measured to only 70 deg today Step-stretch for L knee flexion with foot on edge of 6 inch step x 2 min Standing HS curl at TM x 10 Standing marching to encourage hip/knee flex for amb x 10 ea  MANUAL THERAPY: To promote normalized muscle tension, improved flexibility, improved joint mobility, and increased ROM.  STM/DTM to distal and mid quads, primarily VMO & VI/RF - patient tender  throughout med> lat L patellar mobs - all directions with knee straight   PATIENT EDUCATION:   Education details: progress with PT and ongoing PT POC Person educated: Patient Education method: Explanation Education comprehension: verbalized understanding  HOME EXERCISE PROGRAM: Access Code: [email or web address] URL: [email or web address] Date: 09/19/2022 Prepared by: Annie Paras  Exercises - Sit to Stand  - 3 x daily - 7 x weekly - 1-2 sets - 10 reps - Seated Long Arc Quad  - 2 x daily - 7 x weekly - 3 sets - 10 reps - 5 sec hold - Long Sitting Inferior Patellar Glide  - 2 x daily - 7 x weekly - 2 sets - 10 reps - 3 sec hold - Long Sitting Superior Patellar Glide  - 2 x daily - 7 x weekly - 2 sets - 10 reps - 3 sec hold - Long Sitting Medial Patellar Glide  - 2 x daily - 7 x weekly - 2 sets - 10 reps - 3 sec hold - Long Sitting Lateral Patellar Glide  - 2 x daily - 7 x weekly - 2 sets - 10 reps - 3 sec hold - Prone Quadriceps Stretch with Strap (Mirrored)  - 3 x daily - 7 x weekly - 1 sets - 3 reps - 30-60 sec hold - Supine Quadriceps Stretch with Strap on Table  - 3 x daily - 7 x weekly - 3 reps - 30-60 sec hold - Supine Heel Slide with Strap  - 3 x daily - 7 x weekly - 2 sets - 10 reps - 3 sec hold - Supine 90/90 Knee Flexion/Extension  - 3 x daily - 7  x weekly - 2 sets - 10 reps - 3 sec hold - Knee Pendulum Swings  - 3 x daily - 7 x weekly - 2 sets - 10 reps - 3 sec hold - Seated Heel Slide  - 3 x daily - 7 x weekly - 2 sets - 10 reps - 3 sec hold - Seated Knee Flexion AAROM (Mirrored)  - 3 x daily - 7 x weekly - 2 sets - 10 reps - max hold - Seated Knee Flexion Stretch (Mirrored)  - 3 x daily - 7 x weekly - 2 sets - 10 reps - max hold - Standing Knee Flexion Stretch on Step  - 3 x daily - 7 x weekly - 2 sets - 10 reps - 3 sec hold  Patient Education - Scar Massage  ASSESSMENT:  CLINICAL IMPRESSION:  Progressed exercises to work on improving knee flexion. Continued  utilizing exercises and manual techniques to patient's tolerance. She was able to achieve 90 deg of flexion today in sitting although gaining 94 deg last session, She would continue to benefit from skilled PT.  OBJECTIVE IMPAIRMENTS: Abnormal gait, decreased activity tolerance, decreased ROM, decreased strength, increased edema, increased muscle spasms, impaired flexibility, postural dysfunction, and pain.   ACTIVITY LIMITATIONS: sitting, standing, squatting, dressing, and locomotion level  PARTICIPATION LIMITATIONS: driving  PERSONAL FACTORS: 1-2 comorbidities: R TKR, back surgery  are also affecting patient's functional outcome.   REHAB POTENTIAL: Excellent  CLINICAL DECISION MAKING: Stable/uncomplicated  EVALUATION COMPLEXITY: Low  GOALS: Goals reviewed with patient? Yes   SHORT TERM GOALS: Target date: 08/31/2022    Independent with initial HEP. Baseline:  Goal status: MET  08/25/22  LONG TERM GOALS: Target date: 10/12/2022, extended to 11/03/22   Independent with advanced/ongoing HEP to improve outcomes and carryover.  Baseline:  Goal status: IN PROGRESS  09/30/22 - Met for current HEP  2.  Kaelee Fiorentino will demonstrate L knee flexion to 115 deg to ascend/descend stairs. Baseline: AROM - 65, PROM - 75 Goal status: IN PROGRESS  09/30/22 - AROM 0-88, flexion PROM 0-91  3.    Shajuan Aleshire will be able to ambulate 600' safely with LRAD and normal gait pattern to access community.  Baseline:  Goal status: IN PROGRESS  09/29/22 - pt continues to require cues for increased L hip and knee flexion and avoidance of L hip hike  4.  Jaylee Vangieson will be able to ascend/descend 3 stairs with 1 HR and reciprocal step pattern safely to access home and community.  Baseline:  Goal status: IN PROGRESS  5.  Jillienne Justus will demonstrate </= 25 sec on TUG to demonstrate decreased risk of falls.   Baseline: 27.08 sec with no AD Goal status: MET and REVISED (See 5a below)  09/30/22  - 15.5 sec  5a.  Patient will demonstrate decreased TUG time to </= 13.5 sec to decrease risk for falls with transitional mobility. Baseline: 15.5 sec w/o AD (09/30/22) Goal status: INITIAL    6.  Anicka Labriola will demonstrate improved functional LE strength by completing 5x STS in < 19  seconds.  (MCID 5 seconds) Baseline: 24.87 sec with bil UE support Goal status: IN PROGRESS 09/15/22 - 24.31 seconds w/o UE assist  7.  Rosela Luviano will report 51/80 on LEFS to demonstrate improved function.   Baseline: 42 / 80 = 52.5 % Goal status: IN PROGRESS 09/15/22 - 49/80=61.8%   PLAN:  PT FREQUENCY: 2-3x/week   PT DURATION: 6 weeks   PLANNED INTERVENTIONS: Therapeutic  exercises, Therapeutic activity, Neuromuscular re-education, Balance training, Gait training, Patient/Family education, Self Care, Joint mobilization, Stair training, Aquatic Therapy, Dry Needling, Electrical stimulation, Cryotherapy, Moist heat, scar mobilization, Taping, Vasopneumatic device, and Manual therapy  PLAN FOR NEXT SESSION: L knee ROM/stretching focusing on L knee flexion and increased knee flexibility (does better in sitting); MT as indicated; proximal LE strengthening; gait training to normalize gait pattern as indicated    Artist Pais, PTA 10/03/2022, 11:01 AM

## 2022-10-04 DIAGNOSIS — H524 Presbyopia: Secondary | ICD-10-CM | POA: Diagnosis not present

## 2022-10-04 DIAGNOSIS — H40053 Ocular hypertension, bilateral: Secondary | ICD-10-CM | POA: Diagnosis not present

## 2022-10-04 DIAGNOSIS — H47323 Drusen of optic disc, bilateral: Secondary | ICD-10-CM | POA: Diagnosis not present

## 2022-10-04 DIAGNOSIS — H52223 Regular astigmatism, bilateral: Secondary | ICD-10-CM | POA: Diagnosis not present

## 2022-10-04 DIAGNOSIS — H2513 Age-related nuclear cataract, bilateral: Secondary | ICD-10-CM | POA: Diagnosis not present

## 2022-10-04 DIAGNOSIS — H5203 Hypermetropia, bilateral: Secondary | ICD-10-CM | POA: Diagnosis not present

## 2022-10-05 DIAGNOSIS — H524 Presbyopia: Secondary | ICD-10-CM | POA: Diagnosis not present

## 2022-10-05 NOTE — Therapy (Signed)
OUTPATIENT PHYSICAL THERAPY TREATMENT / PROGRESS NOTE  Progress Note  Reporting Period 08/17/22 to 10/06/22  See note below for Objective Data and Assessment of Progress/Goals.    Patient Name: Kristina Burns MRN: [id number] DOB:1945-12-03, 77 y.o., female Today's Date: 10/06/2022  END OF SESSION:  PT End of Session - 10/06/22 0928     Visit Number 20    Date for PT Re-Evaluation 11/03/22    Authorization Type [employer] MCR    Progress Note Due on Visit 20    PT Start Time 0930    PT Stop Time 1019    PT Time Calculation (min) 49 min    Activity Tolerance Patient tolerated treatment well    Behavior During Therapy WFL for tasks assessed/performed                       Past Medical History:  Diagnosis Date   Arthritis    Asthma    High cholesterol    Past Surgical History:  Procedure Laterality Date   ABDOMINAL HYSTERECTOMY     BACK SURGERY     TOTAL KNEE ARTHROPLASTY Right 08/21/2018   Procedure: RIGHT TOTAL KNEE ARTHROPLASTY;  Surgeon: Mcarthur Rossetti, MD;  Location: [facility];  Service: Orthopedics;  Laterality: Right;   TOTAL KNEE ARTHROPLASTY Left 07/14/2022   Procedure: LEFT TOTAL KNEE ARTHROPLASTY;  Surgeon: Mcarthur Rossetti, MD;  Location: [facility];  Service: Orthopedics;  Laterality: Left;   Patient Active Problem List   Diagnosis Date Noted   OA (osteoarthritis) of knee 07/14/2022   Status post total left knee replacement 07/14/2022   Status post total knee replacement, right 08/21/2018   Chronic pain of both knees 07/10/2017   Unilateral primary osteoarthritis, left knee 07/10/2017    PCP: Haywood Pao, MD  REFERRING PROVIDER: Pete Pelt, PA-C  REFERRING DIAG: [phone] (ICD-10-CM) - Status post total left knee replacement   THERAPY DIAG:  Stiffness of left knee, not elsewhere classified  Acute pain of left knee  Muscle weakness (generalized)  RATIONALE FOR EVALUATION AND TREATMENT: Rehabilitation  ONSET DATE:  07/14/22  NEXT MD VISIT: 10/06/22   SUBJECTIVE:   SUBJECTIVE STATEMENT:  No pain, feels great today   PAIN:  Are you having pain? No  PERTINENT HISTORY: R TKR, back surgery, asthma  PRECAUTIONS: None  WEIGHT BEARING RESTRICTIONS: No  FALLS:  Has patient fallen in last 6 months? No  LIVING ENVIRONMENT: Lives with: lives with their spouse Lives in: House/apartment Stairs: Yes: External: 2-3 steps; can reach both Has following equipment at home: Single point cane and Walker - 2 wheeled  OCCUPATION: retired  PLOF: Independent  PATIENT GOALS: get back to normal, wants to drive  OBJECTIVE:   DIAGNOSTIC FINDINGS: N/A  PATIENT SURVEYS:  LEFS 42/80 = 52.5% (eval); 49/80 = 61.3% (09/15/22)  COGNITION: Overall cognitive status: Within functional limits for tasks assessed     SENSATION: WFL  EDEMA:  Circumferential: R  45.5  cm;  L 49 cm  MUSCLE LENGTH: HS: mild tightness Quads:marked bil ITB: NT Piriformis: NT Hip Flexors:NT Heelcords: WFL  POSTURE: flexed trunk   PALPATION: Palpation: TTP at medial knee.   Patellar Mobility: mild decrease sup/inf  LOWER EXTREMITY ROM:   AROM Right  eval Left  eval Left 08/22/22 Left 08/29/22 Left 09/22/22 Left 09/26/22 Left 09/30/22 Left 10/06/22  Knee flexion 101 65 74 74 82 86 88 71 supine  86 seated  Knee extension 0 0   1  0 0    PROM Left eval Left 08/22/22 Left 08/29/22 Left 09/12/22 Left 09/15/22 Left 09/22/22 Left 09/30/22 Left 10/06/22  Knee flexion 75 80 85 84 76 82 91 75 supine 91 seated  Knee extension 0     0 0 0   (Blank rows = not tested)  LOWER EXTREMITY MMT:  MMT Right eval Left eval Left  08/29/22 Left  09/15/22 Left 10/06/22  Hip flexion 4 4 4+ 4+ 4+  Hip extension  4+  4+ 4+  Hip abduction  4+  4+ 5  Hip adduction  4  4 4+  Hip internal rotation       Hip external rotation       Knee flexion 5 4+ in available range 4+ in available range 4+ 4+  Knee extension 5 4+ 4+ 5 5  Ankle dorsiflexion 5 4+   4+ 5   (Blank rows = not tested)  FUNCTIONAL TESTS:  5 times sit to stand: 24.87 sec with bil UE support (eval), 24.31 sec w/ no UE support (09/15/22), 15.40 sec no UE support (10/06/22) Timed up and go (TUG): 27.08 no AD, 13.19 sec no AD (10/06/22)  GAIT: Distance walked: 60 Assistive device utilized: Single point cane Level of assistance: Modified independence Comments: decreased step length and heel strike left   TODAY'S TREATMENT:                                                                                                                              DATE:    10/06/22 THERAPEUTIC EXERCISE: to improve flexibility, strength and mobility.  Verbal and tactile cues throughout for technique.  NuStep L3 x 7 min - moved seat up to increase flexion Prone Quad Stretch 3x30" Gravity Assisted R Knee Flexion w/ PT overpressure  THERAPEUTIC ACTIVITIES: ROM Re-Assessment:   MMT Re-Assessment:  5xSTS: 15.40 seconds  TUG: 13.19 seconds  10 m: 11.97 seconds  LEFS: 70/80 87.5%  STAIRS: Level of Assistance: Modified independence Stair Negotiation Technique: Step to Pattern Alternating Pattern  with Single Rail on Left Number of Stairs: 28  Height of Stairs: 9"  Comments: pt still defaults to step to pattern up & down the stairs; on reciprocal attempt, she demonstrates L hip hike to advance L to upper step due to limited knee flexion on ascent and L rotation due to limited knee flexion and limited eccentric quad control with alternating step pattern on descent.    10/03/22 THERAPEUTIC EXERCISE: to improve flexibility, strength and mobility.  Verbal and tactile cues throughout for technique.  NuStep L3 x 7 min - moved seat up to increase flexion Seated L knee pendulum swing w/ 5# weight x 20 Supine hang into knee flexion 5# x 1, 4# x 2; 5-10 sec hold  MANUAL THERAPY: To promote improved flexibility, improved joint mobility, and increased ROM.  Seated L knee flexion + L patellar inferior  glide MWM Seated L knee flexion w/ joint distraction  Contract/relax into L knee flexion in sitting at edge of mat table with PT providing slight longitudinal distraction   09/30/22 THERAPEUTIC EXERCISE: to improve flexibility, strength and mobility.  Verbal and tactile cues throughout for technique.  NuStep L4 x 7 min (Seat 8, moved up to 7 after 4 minutes to increase knee flexion)  L fwd step-over 1/2 FR x 20 - targeting L hip and knee flexion + DF for increased foot clearance and more normal gait pattern Seated L knee flex/ext and CW/CCW pendulums with 3# cuff wt on ankle x 20 each Seated L knee flexion AROM heel slides + R LE overpressure at end ROM flexion 10 x 5"  GAIT TRAINING: To normalize gait pattern.  180 ft w/o AD - cues for increased L hip and knee flexion w/o hip hike to improve L LE advancement for normalized gait pattern  THERAPEUTIC ACTIVITIES:  ROM assessment TUG = 15.50 sec w/o AD  MANUAL THERAPY: To promote improved flexibility, improved joint mobility, and increased ROM.  Seated belt mobs for L knee flexion + L patellar inferior glide MWM Contract/relax into L knee flexion in sitting at edge of mat table with PT providing slight longitudinal distraction   PATIENT EDUCATION:   Education details: progress with PT and ongoing PT POC Person educated: Patient Education method: Explanation Education comprehension: verbalized understanding  HOME EXERCISE PROGRAM: Access Code: [email or web address] URL: [email or web address] Date: 09/19/2022 Prepared by: Annie Paras  Exercises - Sit to Stand  - 3 x daily - 7 x weekly - 1-2 sets - 10 reps - Seated Long Arc Quad  - 2 x daily - 7 x weekly - 3 sets - 10 reps - 5 sec hold - Long Sitting Inferior Patellar Glide  - 2 x daily - 7 x weekly - 2 sets - 10 reps - 3 sec hold - Long Sitting Superior Patellar Glide  - 2 x daily - 7 x weekly - 2 sets - 10 reps - 3 sec hold - Long Sitting Medial Patellar Glide  - 2 x daily - 7 x  weekly - 2 sets - 10 reps - 3 sec hold - Long Sitting Lateral Patellar Glide  - 2 x daily - 7 x weekly - 2 sets - 10 reps - 3 sec hold - Prone Quadriceps Stretch with Strap (Mirrored)  - 3 x daily - 7 x weekly - 1 sets - 3 reps - 30-60 sec hold - Supine Quadriceps Stretch with Strap on Table  - 3 x daily - 7 x weekly - 3 reps - 30-60 sec hold - Supine Heel Slide with Strap  - 3 x daily - 7 x weekly - 2 sets - 10 reps - 3 sec hold - Supine 90/90 Knee Flexion/Extension  - 3 x daily - 7 x weekly - 2 sets - 10 reps - 3 sec hold - Knee Pendulum Swings  - 3 x daily - 7 x weekly - 2 sets - 10 reps - 3 sec hold - Seated Heel Slide  - 3 x daily - 7 x weekly - 2 sets - 10 reps - 3 sec hold - Seated Knee Flexion AAROM (Mirrored)  - 3 x daily - 7 x weekly - 2 sets - 10 reps - max hold - Seated Knee Flexion Stretch (Mirrored)  - 3 x daily - 7 x weekly - 2 sets - 10 reps - max hold - Standing Knee Flexion Stretch on Step  - 3 x daily - 7 x  weekly - 2 sets - 10 reps - 3 sec hold  Patient Education - Scar Massage  ASSESSMENT:  CLINICAL IMPRESSION:  Pt came into today's session with no pain or discomfort. Pt states she is doing a lot better and feels like she can move more. Re-assessment of ROM/MMT with LTG was conducted today to look at pt's progress throughout the past couple of weeks. ROM demonstrated pt is still limited in her L knee flexion but she has been pretty consistent in getting anywhere up to AROM 88 of flexion and 91 PROM. MMT demonstrated she has gotten better in both hip abduction, adduction, and dorsiflexion and has stayed consistent with the rest of her measurements. Gait and stair assessment has improved greatly compared to her initial evaluation but pt demonstrates increased difficulty with reciprocal stair negotiation especially going down the stairs with her L leg highlighting how she still needs to work on her eccentric quadricept control while increasing her knee flexion ROM. Pt overall is  doing a lot better and feels more comfortable in her capabilities. Pt continues to benefit from skilled PT interventions to address her L knee flexion ROM to normalize gait pattern to increase her QOL.   OBJECTIVE IMPAIRMENTS: Abnormal gait, decreased activity tolerance, decreased ROM, decreased strength, increased edema, increased muscle spasms, impaired flexibility, postural dysfunction, and pain.   ACTIVITY LIMITATIONS: sitting, standing, squatting, dressing, and locomotion level  PARTICIPATION LIMITATIONS: driving  PERSONAL FACTORS: 1-2 comorbidities: R TKR, back surgery  are also affecting patient's functional outcome.   REHAB POTENTIAL: Excellent  CLINICAL DECISION MAKING: Stable/uncomplicated  EVALUATION COMPLEXITY: Low  GOALS: Goals reviewed with patient? Yes   SHORT TERM GOALS: Target date: 08/31/2022    Independent with initial HEP. Baseline:  Goal status: MET  08/25/22  LONG TERM GOALS: Target date: 10/12/2022, extended to 11/03/22   Independent with advanced/ongoing HEP to improve outcomes and carryover.  Baseline:  Goal status: IN PROGRESS  10/06/22 - Met for current HEP  2.  Kristina Burns will demonstrate L knee flexion to 115 deg to ascend/descend stairs. Baseline: AROM - 65, PROM - 75 Goal status: IN PROGRESS  10/06/22 - AROM 0-88, flexion PROM 0-91  3.    Kristina Burns will be able to ambulate 600' safely with LRAD and normal gait pattern to access community.  Baseline:  Goal status: IN PROGRESS  10/06/22 - pt continues to require cues for increased L hip and knee flexion and avoidance of L hip hike  4.  Kristina Burns will be able to ascend/descend 3 stairs with 1 HR and reciprocal step pattern safely to access home and community.  Baseline:  Goal status:PARTIALLY MET  10/06/22 - Reciprocal going up but requires step to pattern going down.   5.  Kristina Burns will demonstrate </= 25 sec on TUG to demonstrate decreased risk of falls.   Baseline: 27.08  sec with no AD Goal status: MET and REVISED (See 5a below)  09/30/22 - 15.5 sec  5a.  Patient will demonstrate decreased TUG time to </= 13.5 sec to decrease risk for falls with transitional mobility. Baseline: 15.5 sec w/o AD (09/30/22) Goal status: MET  10/06/22 - 13.19 seconds  6.  Kristina Burns will demonstrate improved functional LE strength by completing 5x STS in < 19  seconds.  (MCID 5 seconds) Baseline: 24.87 sec with bil UE support Goal status: MET  10/06/22 - 15.40 seconds w/o UE assist  7.  Kristina Burns will report 51/80 on LEFS to demonstrate improved function.  Baseline: 42 / 80 = 52.5 % Goal status: MET  10/06/22 - 70/80=87.5%   PLAN:  PT FREQUENCY: 2-3x/week   PT DURATION: 6 weeks   PLANNED INTERVENTIONS: Therapeutic exercises, Therapeutic activity, Neuromuscular re-education, Balance training, Gait training, Patient/Family education, Self Care, Joint mobilization, Stair training, Aquatic Therapy, Dry Needling, Electrical stimulation, Cryotherapy, Moist heat, scar mobilization, Taping, Vasopneumatic device, and Manual therapy  PLAN FOR NEXT SESSION: Ask about MD visit, L knee ROM/stretching focusing on L knee flexion and increased knee flexibility (does better in sitting); MT as indicated; proximal LE strengthening; gait training to normalize gait pattern as indicated    Larrie Fraizer Romero-Perozo, Student-PT 10/06/2022, 11:52 AM

## 2022-10-06 ENCOUNTER — Encounter: Payer: Self-pay | Admitting: Orthopaedic Surgery

## 2022-10-06 ENCOUNTER — Ambulatory Visit: Payer: Medicare Other | Admitting: Physical Therapy

## 2022-10-06 ENCOUNTER — Ambulatory Visit (INDEPENDENT_AMBULATORY_CARE_PROVIDER_SITE_OTHER): Payer: Medicare Other | Admitting: Orthopaedic Surgery

## 2022-10-06 ENCOUNTER — Encounter: Payer: Self-pay | Admitting: Physical Therapy

## 2022-10-06 DIAGNOSIS — Z96652 Presence of left artificial knee joint: Secondary | ICD-10-CM

## 2022-10-06 DIAGNOSIS — M6281 Muscle weakness (generalized): Secondary | ICD-10-CM

## 2022-10-06 DIAGNOSIS — M25562 Pain in left knee: Secondary | ICD-10-CM

## 2022-10-06 DIAGNOSIS — M25662 Stiffness of left knee, not elsewhere classified: Secondary | ICD-10-CM

## 2022-10-06 NOTE — Progress Notes (Signed)
The patient comes in for follow-up after having a manipulation under anesthesia of her left knee.  Next week she will be 3 months status post her left total knee arthroplasty.  We replaced her right knee over a year ago.  Her right knee flexes to just past 90 degrees.  Her left knee today flexes to just past 90 degrees.  I did show her a picture of her left knee and I did flex her to over 110 to 115 degrees in the operating room.  She is happy overall.  She is ambit with a cane and states she is doing a lot better and reports on her own good range of motion and strength.  I would like to see her back in 4 weeks to make sure she is doing well.  Will have a standing AP and lateral of her left knee at that visit.

## 2022-10-10 ENCOUNTER — Ambulatory Visit: Payer: Medicare Other | Attending: Physician Assistant

## 2022-10-10 DIAGNOSIS — M25562 Pain in left knee: Secondary | ICD-10-CM | POA: Insufficient documentation

## 2022-10-10 DIAGNOSIS — M25662 Stiffness of left knee, not elsewhere classified: Secondary | ICD-10-CM | POA: Diagnosis not present

## 2022-10-10 DIAGNOSIS — M6281 Muscle weakness (generalized): Secondary | ICD-10-CM | POA: Diagnosis not present

## 2022-10-10 NOTE — Therapy (Signed)
OUTPATIENT PHYSICAL THERAPY TREATMENT      Patient Name: Kristina Burns MRN: [id number] DOB:Aug 13, 1945, 77 y.o., female Today's Date: 10/10/2022  END OF SESSION:  PT End of Session - 10/10/22 1016     Visit Number 21    Date for PT Re-Evaluation 11/03/22    Authorization Type [employer] MCR    Progress Note Due on Visit 20    PT Start Time 0934    PT Stop Time 1015    PT Time Calculation (min) 41 min    Activity Tolerance Patient tolerated treatment well    Behavior During Therapy WFL for tasks assessed/performed                        Past Medical History:  Diagnosis Date   Arthritis    Asthma    High cholesterol    Past Surgical History:  Procedure Laterality Date   ABDOMINAL HYSTERECTOMY     BACK SURGERY     TOTAL KNEE ARTHROPLASTY Right 08/21/2018   Procedure: RIGHT TOTAL KNEE ARTHROPLASTY;  Surgeon: Mcarthur Rossetti, MD;  Location: [facility];  Service: Orthopedics;  Laterality: Right;   TOTAL KNEE ARTHROPLASTY Left 07/14/2022   Procedure: LEFT TOTAL KNEE ARTHROPLASTY;  Surgeon: Mcarthur Rossetti, MD;  Location: [facility];  Service: Orthopedics;  Laterality: Left;   Patient Active Problem List   Diagnosis Date Noted   OA (osteoarthritis) of knee 07/14/2022   Status post total left knee replacement 07/14/2022   Status post total knee replacement, right 08/21/2018   Chronic pain of both knees 07/10/2017    PCP: Tisovec, Fransico Him, MD  REFERRING PROVIDER: Pete Pelt, PA-C  REFERRING DIAG: [id number] (ICD-10-CM) - Status post total left knee replacement   THERAPY DIAG:  Stiffness of left knee, not elsewhere classified  Acute pain of left knee  Muscle weakness (generalized)  RATIONALE FOR EVALUATION AND TREATMENT: Rehabilitation  ONSET DATE: 07/14/22  NEXT MD VISIT: 11/03/22   SUBJECTIVE:   SUBJECTIVE STATEMENT:  Pt reports that her doctor gave her therapy for another month.  PAIN:  Are you having pain? No  PERTINENT HISTORY: R  TKR, back surgery, asthma  PRECAUTIONS: None  WEIGHT BEARING RESTRICTIONS: No  FALLS:  Has patient fallen in last 6 months? No  LIVING ENVIRONMENT: Lives with: lives with their spouse Lives in: House/apartment Stairs: Yes: External: 2-3 steps; can reach both Has following equipment at home: Single point cane and Walker - 2 wheeled  OCCUPATION: retired  PLOF: Independent  PATIENT GOALS: get back to normal, wants to drive  OBJECTIVE:   DIAGNOSTIC FINDINGS: N/A  PATIENT SURVEYS:  LEFS 42/80 = 52.5% (eval); 49/80 = 61.3% (09/15/22)  COGNITION: Overall cognitive status: Within functional limits for tasks assessed     SENSATION: WFL  EDEMA:  Circumferential: R  45.5  cm;  L 49 cm  MUSCLE LENGTH: HS: mild tightness Quads:marked bil ITB: NT Piriformis: NT Hip Flexors:NT Heelcords: WFL  POSTURE: flexed trunk   PALPATION: Palpation: TTP at medial knee.   Patellar Mobility: mild decrease sup/inf  LOWER EXTREMITY ROM:   AROM Right  eval Left  eval Left 08/22/22 Left 08/29/22 Left 09/22/22 Left 09/26/22 Left 09/30/22 Left 10/06/22  Knee flexion 101 65 74 74 82 86 88 71 supine  86 seated  Knee extension 0 0   1  0 0    PROM Left eval Left 08/22/22 Left 08/29/22 Left 09/12/22 Left 09/15/22 Left 09/22/22 Left 09/30/22 Left 10/06/22 Left  10/10/22  Knee flexion 75 80 85 84 76 82 91 75 supine 91 seated 92 seated  Knee extension 0     0 0 0    (Blank rows = not tested)  LOWER EXTREMITY MMT:  MMT Right eval Left eval Left  08/29/22 Left  09/15/22 Left 10/06/22  Hip flexion 4 4 4+ 4+ 4+  Hip extension  4+  4+ 4+  Hip abduction  4+  4+ 5  Hip adduction  4  4 4+  Hip internal rotation       Hip external rotation       Knee flexion 5 4+ in available range 4+ in available range 4+ 4+  Knee extension 5 4+ 4+ 5 5  Ankle dorsiflexion 5 4+  4+ 5   (Blank rows = not tested)  FUNCTIONAL TESTS:  5 times sit to stand: 24.87 sec with bil UE support (eval), 24.31 sec w/ no UE support  (09/15/22), 15.40 sec no UE support (10/06/22) Timed up and go (TUG): 27.08 no AD, 13.19 sec no AD (10/06/22)  GAIT: Distance walked: 60 Assistive device utilized: Single point cane Level of assistance: Modified independence Comments: decreased step length and heel strike left   TODAY'S TREATMENT:                                                                                                                              DATE:   10/10/22 THERAPEUTIC EXERCISE: to improve flexibility, strength and mobility.  Verbal and tactile cues throughout for technique.  NuStep L3 x 6 min - moved seat up to increase flexion; therapist help to push into flexion Standing march x 10 no weight; 2nd set with 2# weight x 10  Standing knee flexion mobilization on step 6', 8' x 10 each Supine heel slides x 10 with strap Supine quad stretch 3x30 sec with stretch    Manual Therapy: to decrease muscle spasm, pain and improve mobility.  L knee PROM with distraction and tibia IR L knee knee AP mobs grade III-IV in sitting  10/06/22 THERAPEUTIC EXERCISE: to improve flexibility, strength and mobility.  Verbal and tactile cues throughout for technique.  NuStep L3 x 7 min - moved seat up to increase flexion Prone Quad Stretch 3x30" Gravity Assisted R Knee Flexion w/ PT overpressure  THERAPEUTIC ACTIVITIES: ROM Re-Assessment:   MMT Re-Assessment:  5xSTS: 15.40 seconds  TUG: 13.19 seconds  10 m: 11.97 seconds  LEFS: 70/80 87.5%  STAIRS: Level of Assistance: Modified independence Stair Negotiation Technique: Step to Pattern Alternating Pattern  with Single Rail on Left Number of Stairs: 28  Height of Stairs: 9"  Comments: pt still defaults to step to pattern up & down the stairs; on reciprocal attempt, she demonstrates L hip hike to advance L to upper step due to limited knee flexion on ascent and L rotation due to limited knee flexion and limited eccentric quad control with alternating step pattern on descent.  10/03/22 THERAPEUTIC EXERCISE: to improve flexibility, strength and mobility.  Verbal and tactile cues throughout for technique.  NuStep L3 x 7 min - moved seat up to increase flexion Seated L knee pendulum swing w/ 5# weight x 20 Supine hang into knee flexion 5# x 1, 4# x 2; 5-10 sec hold  MANUAL THERAPY: To promote improved flexibility, improved joint mobility, and increased ROM.  Seated L knee flexion + L patellar inferior glide MWM Seated L knee flexion w/ joint distraction Contract/relax into L knee flexion in sitting at edge of mat table with PT providing slight longitudinal distraction   09/30/22 THERAPEUTIC EXERCISE: to improve flexibility, strength and mobility.  Verbal and tactile cues throughout for technique.  NuStep L4 x 7 min (Seat 8, moved up to 7 after 4 minutes to increase knee flexion)  L fwd step-over 1/2 FR x 20 - targeting L hip and knee flexion + DF for increased foot clearance and more normal gait pattern Seated L knee flex/ext and CW/CCW pendulums with 3# cuff wt on ankle x 20 each Seated L knee flexion AROM heel slides + R LE overpressure at end ROM flexion 10 x 5"  GAIT TRAINING: To normalize gait pattern.  180 ft w/o AD - cues for increased L hip and knee flexion w/o hip hike to improve L LE advancement for normalized gait pattern  THERAPEUTIC ACTIVITIES:  ROM assessment TUG = 15.50 sec w/o AD  MANUAL THERAPY: To promote improved flexibility, improved joint mobility, and increased ROM.  Seated belt mobs for L knee flexion + L patellar inferior glide MWM Contract/relax into L knee flexion in sitting at edge of mat table with PT providing slight longitudinal distraction   PATIENT EDUCATION:   Education details: progress with PT and ongoing PT POC Person educated: Patient Education method: Explanation Education comprehension: verbalized understanding  HOME EXERCISE PROGRAM: Access Code: [email or web address] URL: [email or web address] Date:  09/19/2022 Prepared by: Annie Paras  Exercises - Sit to Stand  - 3 x daily - 7 x weekly - 1-2 sets - 10 reps - Seated Long Arc Quad  - 2 x daily - 7 x weekly - 3 sets - 10 reps - 5 sec hold - Long Sitting Inferior Patellar Glide  - 2 x daily - 7 x weekly - 2 sets - 10 reps - 3 sec hold - Long Sitting Superior Patellar Glide  - 2 x daily - 7 x weekly - 2 sets - 10 reps - 3 sec hold - Long Sitting Medial Patellar Glide  - 2 x daily - 7 x weekly - 2 sets - 10 reps - 3 sec hold - Long Sitting Lateral Patellar Glide  - 2 x daily - 7 x weekly - 2 sets - 10 reps - 3 sec hold - Prone Quadriceps Stretch with Strap (Mirrored)  - 3 x daily - 7 x weekly - 1 sets - 3 reps - 30-60 sec hold - Supine Quadriceps Stretch with Strap on Table  - 3 x daily - 7 x weekly - 3 reps - 30-60 sec hold - Supine Heel Slide with Strap  - 3 x daily - 7 x weekly - 2 sets - 10 reps - 3 sec hold - Supine 90/90 Knee Flexion/Extension  - 3 x daily - 7 x weekly - 2 sets - 10 reps - 3 sec hold - Knee Pendulum Swings  - 3 x daily - 7 x weekly - 2 sets - 10 reps - 3 sec hold -  Seated Heel Slide  - 3 x daily - 7 x weekly - 2 sets - 10 reps - 3 sec hold - Seated Knee Flexion AAROM (Mirrored)  - 3 x daily - 7 x weekly - 2 sets - 10 reps - max hold - Seated Knee Flexion Stretch (Mirrored)  - 3 x daily - 7 x weekly - 2 sets - 10 reps - max hold - Standing Knee Flexion Stretch on Step  - 3 x daily - 7 x weekly - 2 sets - 10 reps - 3 sec hold  Patient Education - Scar Massage  ASSESSMENT:  CLINICAL IMPRESSION:  Advanced patient through interventions focusing on knee flexion mobilization to improve ROM. She reports that Dr. Ninfa Linden wants her to continue PT with next F/U on 11/03/22. Able to achieve 92 deg of flexion in sitting PROM. Patient noted that she would need to cx appointment on Wednesday, will hopefully be able to get her in on Thurs or Fri.  OBJECTIVE IMPAIRMENTS: Abnormal gait, decreased activity tolerance, decreased ROM,  decreased strength, increased edema, increased muscle spasms, impaired flexibility, postural dysfunction, and pain.   ACTIVITY LIMITATIONS: sitting, standing, squatting, dressing, and locomotion level  PARTICIPATION LIMITATIONS: driving  PERSONAL FACTORS: 1-2 comorbidities: R TKR, back surgery  are also affecting patient's functional outcome.   REHAB POTENTIAL: Excellent  CLINICAL DECISION MAKING: Stable/uncomplicated  EVALUATION COMPLEXITY: Low  GOALS: Goals reviewed with patient? Yes   SHORT TERM GOALS: Target date: 08/31/2022    Independent with initial HEP. Baseline:  Goal status: MET  08/25/22  LONG TERM GOALS: Target date: 10/12/2022, extended to 11/03/22   Independent with advanced/ongoing HEP to improve outcomes and carryover.  Baseline:  Goal status: IN PROGRESS  10/06/22 - Met for current HEP  2.  Terrin Darty will demonstrate L knee flexion to 115 deg to ascend/descend stairs. Baseline: AROM - 65, PROM - 75 Goal status: IN PROGRESS  10/06/22 - AROM 0-88, flexion PROM 0-91  3.    Caryssa Kalish will be able to ambulate 600' safely with LRAD and normal gait pattern to access community.  Baseline:  Goal status: IN PROGRESS  10/06/22 - pt continues to require cues for increased L hip and knee flexion and avoidance of L hip hike  4.  Kamdynn Brickhouse will be able to ascend/descend 3 stairs with 1 HR and reciprocal step pattern safely to access home and community.  Baseline:  Goal status:PARTIALLY MET  10/06/22 - Reciprocal going up but requires step to pattern going down.   5.  Breyana Tullo will demonstrate </= 25 sec on TUG to demonstrate decreased risk of falls.   Baseline: 27.08 sec with no AD Goal status: MET and REVISED (See 5a below)  09/30/22 - 15.5 sec  5a.  Patient will demonstrate decreased TUG time to </= 13.5 sec to decrease risk for falls with transitional mobility. Baseline: 15.5 sec w/o AD (09/30/22) Goal status: MET  10/06/22 - 13.19 seconds  6.   Eliot Moening will demonstrate improved functional LE strength by completing 5x STS in < 19  seconds.  (MCID 5 seconds) Baseline: 24.87 sec with bil UE support Goal status: MET  10/06/22 - 15.40 seconds w/o UE assist  7.  Barbarann Zhang will report 51/80 on LEFS to demonstrate improved function.   Baseline: 42 / 80 = 52.5 % Goal status: MET  10/06/22 - 70/80=87.5%   PLAN:  PT FREQUENCY: 2-3x/week   PT DURATION: 6 weeks   PLANNED INTERVENTIONS: Therapeutic exercises, Therapeutic activity,  Neuromuscular re-education, Balance training, Gait training, Patient/Family education, Self Care, Joint mobilization, Stair training, Aquatic Therapy, Dry Needling, Electrical stimulation, Cryotherapy, Moist heat, scar mobilization, Taping, Vasopneumatic device, and Manual therapy  PLAN FOR NEXT SESSION: L knee ROM/stretching focusing on L knee flexion and increased knee flexibility (does better in sitting); MT as indicated; proximal LE strengthening; gait training to normalize gait pattern as indicated    Artist Pais, PTA 10/10/2022, 10:39 AM

## 2022-10-12 ENCOUNTER — Encounter: Payer: Medicare Other | Admitting: Physical Therapy

## 2022-10-12 ENCOUNTER — Ambulatory Visit: Payer: Medicare Other | Admitting: Physical Therapy

## 2022-10-13 ENCOUNTER — Encounter: Payer: Self-pay | Admitting: Radiology

## 2022-10-17 ENCOUNTER — Encounter: Payer: Self-pay | Admitting: Physical Therapy

## 2022-10-17 ENCOUNTER — Ambulatory Visit: Payer: Medicare Other | Admitting: Physical Therapy

## 2022-10-17 DIAGNOSIS — M25562 Pain in left knee: Secondary | ICD-10-CM

## 2022-10-17 DIAGNOSIS — M6281 Muscle weakness (generalized): Secondary | ICD-10-CM

## 2022-10-17 DIAGNOSIS — M25662 Stiffness of left knee, not elsewhere classified: Secondary | ICD-10-CM

## 2022-10-17 NOTE — Therapy (Signed)
OUTPATIENT PHYSICAL THERAPY TREATMENT      Patient Name: Kristina Burns MRN: [id number] DOB:[birth year], 77 y.o., female Today's Date: 10/17/2022  END OF SESSION:  PT End of Session - 10/17/22 0850     Visit Number 22    Date for PT Re-Evaluation 11/03/22    Authorization Type [employer] MCR    Progress Note Due on Visit 30    PT Start Time 0850    PT Stop Time 0932    PT Time Calculation (min) 42 min    Activity Tolerance Patient tolerated treatment well    Behavior During Therapy WFL for tasks assessed/performed                        Past Medical History:  Diagnosis Date   Arthritis    Asthma    High cholesterol    Past Surgical History:  Procedure Laterality Date   ABDOMINAL HYSTERECTOMY     BACK SURGERY     TOTAL KNEE ARTHROPLASTY Right 08/21/2018   Procedure: RIGHT TOTAL KNEE ARTHROPLASTY;  Surgeon: Mcarthur Rossetti, MD;  Location: [place];  Service: Orthopedics;  Laterality: Right;   TOTAL KNEE ARTHROPLASTY Left 07/14/2022   Procedure: LEFT TOTAL KNEE ARTHROPLASTY;  Surgeon: Mcarthur Rossetti, MD;  Location: [place];  Service: Orthopedics;  Laterality: Left;   Patient Active Problem List   Diagnosis Date Noted   OA (osteoarthritis) of knee 07/14/2022   Status post total left knee replacement 07/14/2022   Status post total knee replacement, right 08/21/2018   Chronic pain of both knees 07/10/2017    PCP: Tisovec, Fransico Him, MD  REFERRING PROVIDER: Pete Pelt, PA-C  REFERRING DIAG: [phone] (ICD-10-CM) - Status post total left knee replacement   THERAPY DIAG:  Stiffness of left knee, not elsewhere classified  Acute pain of left knee  Muscle weakness (generalized)  RATIONALE FOR EVALUATION AND TREATMENT: Rehabilitation  ONSET DATE: 07/14/22  NEXT MD VISIT: 11/03/22   SUBJECTIVE:   SUBJECTIVE STATEMENT:  Pt reports her knee is most stiff in the mornings but the HEP helps.  PAIN:  Are you having pain? No  PERTINENT  HISTORY: R TKR, back surgery, asthma  PRECAUTIONS: None  WEIGHT BEARING RESTRICTIONS: No  FALLS:  Has patient fallen in last 6 months? No  LIVING ENVIRONMENT: Lives with: lives with their spouse Lives in: House/apartment Stairs: Yes: External: 2-3 steps; can reach both Has following equipment at home: Single point cane and Walker - 2 wheeled  OCCUPATION: retired  PLOF: Independent  PATIENT GOALS: get back to normal, wants to drive  OBJECTIVE:   DIAGNOSTIC FINDINGS: N/A  PATIENT SURVEYS:  LEFS 42/80 = 52.5% (eval); 49/80 = 61.3% (09/15/22)  COGNITION: Overall cognitive status: Within functional limits for tasks assessed     SENSATION: WFL  EDEMA:  Circumferential: R  45.5  cm;  L 49 cm  MUSCLE LENGTH: HS: mild tightness Quads:marked bil ITB: NT Piriformis: NT Hip Flexors:NT Heelcords: WFL  POSTURE: flexed trunk   PALPATION: Palpation: TTP at medial knee.   Patellar Mobility: mild decrease sup/inf  LOWER EXTREMITY ROM:   AROM Right  eval Left  eval Left 08/22/22 Left 08/29/22 Left 09/22/22 Left 09/26/22 Left 09/30/22 Left 10/06/22  Knee flexion 101 65 74 74 82 86 88 71 supine  86 seated  Knee extension 0 0   1  0 0    PROM Left eval Left 08/22/22 Left 08/29/22 Left 09/12/22 Left 09/15/22 Left 09/22/22 Left 09/30/22  Left 10/06/22 Left 10/10/22  Knee flexion 75 80 85 84 76 82 91 75 supine 91 seated 92 seated  Knee extension 0     0 0 0    (Blank rows = not tested)  LOWER EXTREMITY MMT:  MMT Right eval Left eval Left  08/29/22 Left  09/15/22 Left 10/06/22  Hip flexion 4 4 4+ 4+ 4+  Hip extension  4+  4+ 4+  Hip abduction  4+  4+ 5  Hip adduction  4  4 4+  Hip internal rotation       Hip external rotation       Knee flexion 5 4+ in available range 4+ in available range 4+ 4+  Knee extension 5 4+ 4+ 5 5  Ankle dorsiflexion 5 4+  4+ 5   (Blank rows = not tested)  FUNCTIONAL TESTS:  5 times sit to stand: 24.87 sec with bil UE support (eval), 24.31 sec w/ no  UE support (09/15/22), 15.40 sec no UE support (10/06/22) Timed up and go (TUG): 27.08 no AD, 13.19 sec no AD (10/06/22)  GAIT: Distance walked: 60 Assistive device utilized: Single point cane Level of assistance: Modified independence Comments: decreased step length and heel strike left   TODAY'S TREATMENT:                                                                                                                              DATE:    10/17/22 THERAPEUTIC EXERCISE: to improve flexibility, strength and mobility.  Verbal and tactile cues throughout for technique. NuStep - L5 x 6 min (S7/H8) 6" L fwd step-up x 10 Standing knee flexion mobilization on 16" step 10 x 10" 6" L fwd step-up and over x 10, UE support on widow ledge and back of chair Counter squat 10 x 5" Counter squat with L foot on 4" step 10 x 5" Standing L 4-way GTB SLR x 10, UE support on back of chair Seated L GTB HS curl x 10 Seated L GTB LAQ x 10    10/10/22 THERAPEUTIC EXERCISE: to improve flexibility, strength and mobility.  Verbal and tactile cues throughout for technique.  NuStep L3 x 6 min - moved seat up to increase flexion; therapist help to push into flexion Standing march x 10 no weight; 2nd set with 2# weight x 10  Standing knee flexion mobilization on step 6', 8' x 10 each Supine heel slides x 10 with strap Supine quad stretch 3x30 sec with stretch    Manual Therapy: to decrease muscle spasm, pain and improve mobility.  L knee PROM with distraction and tibia IR L knee knee AP mobs grade III-IV in sitting   10/06/22 THERAPEUTIC EXERCISE: to improve flexibility, strength and mobility.  Verbal and tactile cues throughout for technique.  NuStep L3 x 7 min - moved seat up to increase flexion Prone Quad Stretch 3x30" Gravity Assisted R Knee Flexion w/ PT overpressure  THERAPEUTIC ACTIVITIES: ROM  Re-Assessment:   MMT Re-Assessment:  5xSTS: 15.40 seconds  TUG: 13.19 seconds  10 m: 11.97 seconds  LEFS:  70/80 87.5%  STAIRS: Level of Assistance: Modified independence Stair Negotiation Technique: Step to Pattern Alternating Pattern  with Single Rail on Left Number of Stairs: 28  Height of Stairs: 9"  Comments: pt still defaults to step to pattern up & down the stairs; on reciprocal attempt, she demonstrates L hip hike to advance L to upper step due to limited knee flexion on ascent and L rotation due to limited knee flexion and limited eccentric quad control with alternating step pattern on descent.    PATIENT EDUCATION:   Education details: progress with PT and ongoing PT POC Person educated: Patient Education method: Explanation Education comprehension: verbalized understanding  HOME EXERCISE PROGRAM: Access Code: [email or web address] URL: [email or web address] Date: 09/19/2022 Prepared by: Annie Paras  Exercises - Sit to Stand  - 3 x daily - 7 x weekly - 1-2 sets - 10 reps - Seated Long Arc Quad  - 2 x daily - 7 x weekly - 3 sets - 10 reps - 5 sec hold - Long Sitting Inferior Patellar Glide  - 2 x daily - 7 x weekly - 2 sets - 10 reps - 3 sec hold - Long Sitting Superior Patellar Glide  - 2 x daily - 7 x weekly - 2 sets - 10 reps - 3 sec hold - Long Sitting Medial Patellar Glide  - 2 x daily - 7 x weekly - 2 sets - 10 reps - 3 sec hold - Long Sitting Lateral Patellar Glide  - 2 x daily - 7 x weekly - 2 sets - 10 reps - 3 sec hold - Prone Quadriceps Stretch with Strap (Mirrored)  - 3 x daily - 7 x weekly - 1 sets - 3 reps - 30-60 sec hold - Supine Quadriceps Stretch with Strap on Table  - 3 x daily - 7 x weekly - 3 reps - 30-60 sec hold - Supine Heel Slide with Strap  - 3 x daily - 7 x weekly - 2 sets - 10 reps - 3 sec hold - Supine 90/90 Knee Flexion/Extension  - 3 x daily - 7 x weekly - 2 sets - 10 reps - 3 sec hold - Knee Pendulum Swings  - 3 x daily - 7 x weekly - 2 sets - 10 reps - 3 sec hold - Seated Heel Slide  - 3 x daily - 7 x weekly - 2 sets - 10 reps - 3 sec hold -  Seated Knee Flexion AAROM (Mirrored)  - 3 x daily - 7 x weekly - 2 sets - 10 reps - max hold - Seated Knee Flexion Stretch (Mirrored)  - 3 x daily - 7 x weekly - 2 sets - 10 reps - max hold - Standing Knee Flexion Stretch on Step  - 3 x daily - 7 x weekly - 2 sets - 10 reps - 3 sec hold  Patient Education - Scar Massage  ASSESSMENT:  CLINICAL IMPRESSION:  Kristina "[name]" denies any pain but notes ongoing stiffness, especially in the morning. Therapeutic exercises and activities focusing increasing L knee flexion with functional movement patterns with pt noting most benefit from step-over on 6' step. Continued with proximal LE/hip and knee strengthening to improve stability and activity tolerance - continued limitation with active knee flexion but improving HS activation during seated HS curls. Kristina "[name]" will continue to benefit from skilled PT to address  ongoing ROM and strength deficits to improve mobility and activity tolerance with decreased pain interference.   OBJECTIVE IMPAIRMENTS: Abnormal gait, decreased activity tolerance, decreased ROM, decreased strength, increased edema, increased muscle spasms, impaired flexibility, postural dysfunction, and pain.   ACTIVITY LIMITATIONS: sitting, standing, squatting, dressing, and locomotion level  PARTICIPATION LIMITATIONS: driving  PERSONAL FACTORS: 1-2 comorbidities: R TKR, back surgery  are also affecting patient's functional outcome.   REHAB POTENTIAL: Excellent  CLINICAL DECISION MAKING: Stable/uncomplicated  EVALUATION COMPLEXITY: Low  GOALS: Goals reviewed with patient? Yes   SHORT TERM GOALS: Target date: 08/31/2022    Independent with initial HEP. Baseline:  Goal status: MET  08/25/22  LONG TERM GOALS: Target date: 10/12/2022, extended to 11/03/22   Independent with advanced/ongoing HEP to improve outcomes and carryover.  Baseline:  Goal status: IN PROGRESS  10/06/22 - Met for current HEP  2.  Kristina Burns will demonstrate  L knee flexion to 115 deg to ascend/descend stairs. Baseline: AROM - 65, PROM - 75 Goal status: IN PROGRESS  10/06/22 - AROM 0-88, flexion PROM 0-91  3.    Kristina Burns will be able to ambulate 600' safely with LRAD and normal gait pattern to access community.  Baseline:  Goal status: IN PROGRESS  10/06/22 - pt continues to require cues for increased L hip and knee flexion and avoidance of L hip hike  4.  Kristina Burns will be able to ascend/descend 3 stairs with 1 HR and reciprocal step pattern safely to access home and community.  Baseline:  Goal status:PARTIALLY MET  10/06/22 - Reciprocal going up but requires step to pattern going down.   5.  Kristina Burns will demonstrate </= 25 sec on TUG to demonstrate decreased risk of falls.   Baseline: 27.08 sec with no AD Goal status: MET and REVISED (See 5a below)  09/30/22 - 15.5 sec  5a.  Patient will demonstrate decreased TUG time to </= 13.5 sec to decrease risk for falls with transitional mobility. Baseline: 15.5 sec w/o AD (09/30/22) Goal status: MET  10/06/22 - 13.19 seconds  6.  Kristina Burns will demonstrate improved functional LE strength by completing 5x STS in < 19  seconds.  (MCID 5 seconds) Baseline: 24.87 sec with bil UE support Goal status: MET  10/06/22 - 15.40 seconds w/o UE assist  7.  Kristina Burns will report 51/80 on LEFS to demonstrate improved function.   Baseline: 42 / 80 = 52.5 % Goal status: MET  10/06/22 - 70/80=87.5%   PLAN:  PT FREQUENCY: 2-3x/week   PT DURATION: 6 weeks   PLANNED INTERVENTIONS: Therapeutic exercises, Therapeutic activity, Neuromuscular re-education, Balance training, Gait training, Patient/Family education, Self Care, Joint mobilization, Stair training, Aquatic Therapy, Dry Needling, Electrical stimulation, Cryotherapy, Moist heat, scar mobilization, Taping, Vasopneumatic device, and Manual therapy  PLAN FOR NEXT SESSION: L knee ROM/stretching focusing on L knee flexion and  increased knee flexibility (does better in sitting); MT as indicated; proximal LE strengthening; gait training to normalize gait pattern as indicated    Percival Spanish, PT 10/17/2022, 1:31 PM

## 2022-10-19 ENCOUNTER — Ambulatory Visit: Payer: Medicare Other

## 2022-10-19 DIAGNOSIS — M6281 Muscle weakness (generalized): Secondary | ICD-10-CM | POA: Diagnosis not present

## 2022-10-19 DIAGNOSIS — M25562 Pain in left knee: Secondary | ICD-10-CM | POA: Diagnosis not present

## 2022-10-19 DIAGNOSIS — M25662 Stiffness of left knee, not elsewhere classified: Secondary | ICD-10-CM

## 2022-10-19 NOTE — Therapy (Signed)
OUTPATIENT PHYSICAL THERAPY TREATMENT      Patient Name: Kristina Burns MRN: [id number] DOB:[birth year], 77 y.o., female Today's Date: 10/19/2022  END OF SESSION:               Past Medical History:  Diagnosis Date   Arthritis    Asthma    High cholesterol    Past Surgical History:  Procedure Laterality Date   ABDOMINAL HYSTERECTOMY     BACK SURGERY     TOTAL KNEE ARTHROPLASTY Right 08/21/2018   Procedure: RIGHT TOTAL KNEE ARTHROPLASTY;  Surgeon: Mcarthur Rossetti, MD;  Location: [place];  Service: Orthopedics;  Laterality: Right;   TOTAL KNEE ARTHROPLASTY Left 07/14/2022   Procedure: LEFT TOTAL KNEE ARTHROPLASTY;  Surgeon: Mcarthur Rossetti, MD;  Location: [place];  Service: Orthopedics;  Laterality: Left;   Patient Active Problem List   Diagnosis Date Noted   OA (osteoarthritis) of knee 07/14/2022   Status post total left knee replacement 07/14/2022   Status post total knee replacement, right 08/21/2018   Chronic pain of both knees 07/10/2017    PCP: Tisovec, Fransico Him, MD  REFERRING PROVIDER: Pete Pelt, PA-C  REFERRING DIAG: [phone] (ICD-10-CM) - Status post total left knee replacement   THERAPY DIAG:  Stiffness of left knee, not elsewhere classified  Acute pain of left knee  Muscle weakness (generalized)  RATIONALE FOR EVALUATION AND TREATMENT: Rehabilitation  ONSET DATE: 07/14/22  NEXT MD VISIT: 11/03/22   SUBJECTIVE:   SUBJECTIVE STATEMENT:  No changes today.  PAIN:  Are you having pain? No  PERTINENT HISTORY: R TKR, back surgery, asthma  PRECAUTIONS: None  WEIGHT BEARING RESTRICTIONS: No  FALLS:  Has patient fallen in last 6 months? No  LIVING ENVIRONMENT: Lives with: lives with their spouse Lives in: House/apartment Stairs: Yes: External: 2-3 steps; can reach both Has following equipment at home: Single point cane and Walker - 2 wheeled  OCCUPATION: retired  PLOF: Independent  PATIENT GOALS: get back to  normal, wants to drive  OBJECTIVE:   DIAGNOSTIC FINDINGS: N/A  PATIENT SURVEYS:  LEFS 42/80 = 52.5% (eval); 49/80 = 61.3% (09/15/22)  COGNITION: Overall cognitive status: Within functional limits for tasks assessed     SENSATION: WFL  EDEMA:  Circumferential: R  45.5  cm;  L 49 cm  MUSCLE LENGTH: HS: mild tightness Quads:marked bil ITB: NT Piriformis: NT Hip Flexors:NT Heelcords: WFL  POSTURE: flexed trunk   PALPATION: Palpation: TTP at medial knee.   Patellar Mobility: mild decrease sup/inf  LOWER EXTREMITY ROM:   AROM Right  eval Left  eval Left 08/22/22 Left 08/29/22 Left 09/22/22 Left 09/26/22 Left 09/30/22 Left 10/06/22 Left 10/19/22  Knee flexion 101 65 74 74 82 86 88 71 supine  86 seated 93 PROM  Knee extension 0 0   1  0 0     PROM Left eval Left 08/22/22 Left 08/29/22 Left 09/12/22 Left 09/15/22 Left 09/22/22 Left 09/30/22 Left 10/06/22 Left 10/10/22  Knee flexion 75 80 85 84 76 82 91 75 supine 91 seated 92 seated  Knee extension 0     0 0 0    (Blank rows = not tested)  LOWER EXTREMITY MMT:  MMT Right eval Left eval Left  08/29/22 Left  09/15/22 Left 10/06/22  Hip flexion 4 4 4+ 4+ 4+  Hip extension  4+  4+ 4+  Hip abduction  4+  4+ 5  Hip adduction  4  4 4+  Hip internal rotation  Hip external rotation       Knee flexion 5 4+ in available range 4+ in available range 4+ 4+  Knee extension 5 4+ 4+ 5 5  Ankle dorsiflexion 5 4+  4+ 5   (Blank rows = not tested)  FUNCTIONAL TESTS:  5 times sit to stand: 24.87 sec with bil UE support (eval), 24.31 sec w/ no UE support (09/15/22), 15.40 sec no UE support (10/06/22) Timed up and go (TUG): 27.08 no AD, 13.19 sec no AD (10/06/22)  GAIT: Distance walked: 60 Assistive device utilized: Single point cane Level of assistance: Modified independence Comments: decreased step length and heel strike left   TODAY'S TREATMENT:                                                                                                                               DATE:   10/19/22 THERAPEUTIC EXERCISE: to improve flexibility, strength and mobility.  Verbal and tactile cues throughout for technique. NuStep - L5 x 7 min (S7/H8) Step up and over 6' step x 10 L LE Knee flexion stretch on 9' stool L x 15 Wall squat 10x- mirror used  Seated HS curl GTB x 20   Manual Therapy: to decrease muscle spasm, pain and improve mobility.  L knee PROM with distraction and tibia IR L knee knee AP mobs grade III-IV in sitting  10/17/22 THERAPEUTIC EXERCISE: to improve flexibility, strength and mobility.  Verbal and tactile cues throughout for technique. NuStep - L5 x 6 min (S7/H8) 6" L fwd step-up x 10 Standing knee flexion mobilization on 16" step 10 x 10" 6" L fwd step-up and over x 10, UE support on widow ledge and back of chair Counter squat 10 x 5" Counter squat with L foot on 4" step 10 x 5" Standing L 4-way GTB SLR x 10, UE support on back of chair Seated L GTB HS curl x 10 Seated L GTB LAQ x 10    10/10/22 THERAPEUTIC EXERCISE: to improve flexibility, strength and mobility.  Verbal and tactile cues throughout for technique.  NuStep L3 x 6 min - moved seat up to increase flexion; therapist help to push into flexion Standing march x 10 no weight; 2nd set with 2# weight x 10  Standing knee flexion mobilization on step 6', 8' x 10 each Supine heel slides x 10 with strap Supine quad stretch 3x30 sec with stretch    Manual Therapy: to decrease muscle spasm, pain and improve mobility.  L knee PROM with distraction and tibia IR L knee knee AP mobs grade III-IV in sitting   10/06/22 THERAPEUTIC EXERCISE: to improve flexibility, strength and mobility.  Verbal and tactile cues throughout for technique.  NuStep L3 x 7 min - moved seat up to increase flexion Prone Quad Stretch 3x30" Gravity Assisted R Knee Flexion w/ PT overpressure  THERAPEUTIC ACTIVITIES: ROM Re-Assessment:   MMT Re-Assessment:  5xSTS: 15.40 seconds  TUG:  13.19 seconds  10 m: 11.97 seconds  LEFS: 70/80 87.5%  STAIRS: Level of Assistance: Modified independence Stair Negotiation Technique: Step to Pattern Alternating Pattern  with Single Rail on Left Number of Stairs: 28  Height of Stairs: 9"  Comments: pt still defaults to step to pattern up & down the stairs; on reciprocal attempt, she demonstrates L hip hike to advance L to upper step due to limited knee flexion on ascent and L rotation due to limited knee flexion and limited eccentric quad control with alternating step pattern on descent.    PATIENT EDUCATION:   Education details: progress with PT and ongoing PT POC Person educated: Patient Education method: Explanation Education comprehension: verbalized understanding  HOME EXERCISE PROGRAM: Access Code: [email or web address] URL: [email or web address] Date: 09/19/2022 Prepared by: Annie Paras  Exercises - Sit to Stand  - 3 x daily - 7 x weekly - 1-2 sets - 10 reps - Seated Long Arc Quad  - 2 x daily - 7 x weekly - 3 sets - 10 reps - 5 sec hold - Long Sitting Inferior Patellar Glide  - 2 x daily - 7 x weekly - 2 sets - 10 reps - 3 sec hold - Long Sitting Superior Patellar Glide  - 2 x daily - 7 x weekly - 2 sets - 10 reps - 3 sec hold - Long Sitting Medial Patellar Glide  - 2 x daily - 7 x weekly - 2 sets - 10 reps - 3 sec hold - Long Sitting Lateral Patellar Glide  - 2 x daily - 7 x weekly - 2 sets - 10 reps - 3 sec hold - Prone Quadriceps Stretch with Strap (Mirrored)  - 3 x daily - 7 x weekly - 1 sets - 3 reps - 30-60 sec hold - Supine Quadriceps Stretch with Strap on Table  - 3 x daily - 7 x weekly - 3 reps - 30-60 sec hold - Supine Heel Slide with Strap  - 3 x daily - 7 x weekly - 2 sets - 10 reps - 3 sec hold - Supine 90/90 Knee Flexion/Extension  - 3 x daily - 7 x weekly - 2 sets - 10 reps - 3 sec hold - Knee Pendulum Swings  - 3 x daily - 7 x weekly - 2 sets - 10 reps - 3 sec hold - Seated Heel Slide  - 3 x daily - 7  x weekly - 2 sets - 10 reps - 3 sec hold - Seated Knee Flexion AAROM (Mirrored)  - 3 x daily - 7 x weekly - 2 sets - 10 reps - max hold - Seated Knee Flexion Stretch (Mirrored)  - 3 x daily - 7 x weekly - 2 sets - 10 reps - max hold - Standing Knee Flexion Stretch on Step  - 3 x daily - 7 x weekly - 2 sets - 10 reps - 3 sec hold  Patient Education - Scar Massage  ASSESSMENT:  CLINICAL IMPRESSION:  Continued working on exercises to improve L knee ROM and strength to improve function. VC needed and visual feedback (mirror) required with wall squat for equal WS. She gained 1 deg of L knee flexion (93 deg) PROM. Pt continues to show need for skilled service and potential for improvement.  OBJECTIVE IMPAIRMENTS: Abnormal gait, decreased activity tolerance, decreased ROM, decreased strength, increased edema, increased muscle spasms, impaired flexibility, postural dysfunction, and pain.   ACTIVITY LIMITATIONS: sitting, standing, squatting, dressing, and locomotion level  PARTICIPATION LIMITATIONS: driving  PERSONAL  FACTORS: 1-2 comorbidities: R TKR, back surgery  are also affecting patient's functional outcome.   REHAB POTENTIAL: Excellent  CLINICAL DECISION MAKING: Stable/uncomplicated  EVALUATION COMPLEXITY: Low  GOALS: Goals reviewed with patient? Yes   SHORT TERM GOALS: Target date: 08/31/2022    Independent with initial HEP. Baseline:  Goal status: MET  08/25/22  LONG TERM GOALS: Target date: 10/12/2022, extended to 11/03/22   Independent with advanced/ongoing HEP to improve outcomes and carryover.  Baseline:  Goal status: IN PROGRESS  10/06/22 - Met for current HEP  2.  Kristina Burns will demonstrate L knee flexion to 115 deg to ascend/descend stairs. Baseline: AROM - 65, PROM - 75 Goal status: IN PROGRESS  10/06/22 - AROM 0-88, flexion PROM 0-91  3.    Kristina Burns will be able to ambulate 600' safely with LRAD and normal gait pattern to access community.  Baseline:   Goal status: IN PROGRESS  10/06/22 - pt continues to require cues for increased L hip and knee flexion and avoidance of L hip hike  4.  Kristina Burns will be able to ascend/descend 3 stairs with 1 HR and reciprocal step pattern safely to access home and community.  Baseline:  Goal status:PARTIALLY MET  10/06/22 - Reciprocal going up but requires step to pattern going down.   5.  Kristina Burns will demonstrate </= 25 sec on TUG to demonstrate decreased risk of falls.   Baseline: 27.08 sec with no AD Goal status: MET and REVISED (See 5a below)  09/30/22 - 15.5 sec  5a.  Patient will demonstrate decreased TUG time to </= 13.5 sec to decrease risk for falls with transitional mobility. Baseline: 15.5 sec w/o AD (09/30/22) Goal status: MET  10/06/22 - 13.19 seconds  6.  Kristina Burns will demonstrate improved functional LE strength by completing 5x STS in < 19  seconds.  (MCID 5 seconds) Baseline: 24.87 sec with bil UE support Goal status: MET  10/06/22 - 15.40 seconds w/o UE assist  7.  Kristina Burns will report 51/80 on LEFS to demonstrate improved function.   Baseline: 42 / 80 = 52.5 % Goal status: MET  10/06/22 - 70/80=87.5%   PLAN:  PT FREQUENCY: 2-3x/week   PT DURATION: 6 weeks   PLANNED INTERVENTIONS: Therapeutic exercises, Therapeutic activity, Neuromuscular re-education, Balance training, Gait training, Patient/Family education, Self Care, Joint mobilization, Stair training, Aquatic Therapy, Dry Needling, Electrical stimulation, Cryotherapy, Moist heat, scar mobilization, Taping, Vasopneumatic device, and Manual therapy  PLAN FOR NEXT SESSION: L knee ROM/stretching focusing on L knee flexion and increased knee flexibility (does better in sitting); MT as indicated; proximal LE strengthening; gait training to normalize gait pattern as indicated    Artist Pais, PTA 10/19/2022, 8:51 AM

## 2022-10-24 ENCOUNTER — Ambulatory Visit: Payer: Medicare Other | Admitting: Physical Therapy

## 2022-10-24 ENCOUNTER — Encounter: Payer: Self-pay | Admitting: Physical Therapy

## 2022-10-24 DIAGNOSIS — M6281 Muscle weakness (generalized): Secondary | ICD-10-CM | POA: Diagnosis not present

## 2022-10-24 DIAGNOSIS — M25662 Stiffness of left knee, not elsewhere classified: Secondary | ICD-10-CM | POA: Diagnosis not present

## 2022-10-24 DIAGNOSIS — M25562 Pain in left knee: Secondary | ICD-10-CM

## 2022-10-24 NOTE — Therapy (Signed)
OUTPATIENT PHYSICAL THERAPY TREATMENT      Patient Name: Kristina Burns MRN: [id number] DOB:May 15, 1946, 77 y.o., female Today's Date: 10/24/2022  END OF SESSION:  PT End of Session - 10/24/22 0848     Visit Number 24    Date for PT Re-Evaluation 11/03/22    Authorization Type BCBS MCR    Progress Note Due on Visit 30    PT Start Time 0845    PT Stop Time 0932    PT Time Calculation (min) 47 min    Activity Tolerance Patient tolerated treatment well    Behavior During Therapy WFL for tasks assessed/performed                         Past Medical History:  Diagnosis Date   Arthritis    Asthma    High cholesterol    Past Surgical History:  Procedure Laterality Date   ABDOMINAL HYSTERECTOMY     BACK SURGERY     TOTAL KNEE ARTHROPLASTY Right 08/21/2018   Procedure: RIGHT TOTAL KNEE ARTHROPLASTY;  Surgeon: Mcarthur Rossetti, MD;  Location: [place];  Service: Orthopedics;  Laterality: Right;   TOTAL KNEE ARTHROPLASTY Left 07/14/2022   Procedure: LEFT TOTAL KNEE ARTHROPLASTY;  Surgeon: Mcarthur Rossetti, MD;  Location: [place];  Service: Orthopedics;  Laterality: Left;   Patient Active Problem List   Diagnosis Date Noted   OA (osteoarthritis) of knee 07/14/2022   Status post total left knee replacement 07/14/2022   Status post total knee replacement, right 08/21/2018   Chronic pain of both knees 07/10/2017    PCP: Tisovec, Fransico Him, MD  REFERRING PROVIDER: Pete Pelt, PA-C  REFERRING DIAG: [phone] (ICD-10-CM) - Status post total left knee replacement   THERAPY DIAG:  Stiffness of left knee, not elsewhere classified  Acute pain of left knee  Muscle weakness (generalized)  RATIONALE FOR EVALUATION AND TREATMENT: Rehabilitation  ONSET DATE: 07/14/22  NEXT MD VISIT: 11/03/22   SUBJECTIVE:   SUBJECTIVE STATEMENT:  Pt now walking consistently w/o SPC and denies any issues.  PAIN:  Are you having pain? No  PERTINENT HISTORY: R  TKR, back surgery, asthma  PRECAUTIONS: None  WEIGHT BEARING RESTRICTIONS: No  FALLS:  Has patient fallen in last 6 months? No  LIVING ENVIRONMENT: Lives with: lives with their spouse Lives in: House/apartment Stairs: Yes: External: 2-3 steps; can reach both Has following equipment at home: Single point cane and Walker - 2 wheeled  OCCUPATION: retired  PLOF: Independent  PATIENT GOALS: get back to normal, wants to drive  OBJECTIVE:   DIAGNOSTIC FINDINGS: N/A  PATIENT SURVEYS:  LEFS 42/80 = 52.5% (eval); 49/80 = 61.3% (09/15/22)  COGNITION: Overall cognitive status: Within functional limits for tasks assessed     SENSATION: WFL  EDEMA:  Circumferential: R  45.5  cm;  L 49 cm  MUSCLE LENGTH: HS: mild tightness Quads:marked bil ITB: NT Piriformis: NT Hip Flexors:NT Heelcords: WFL  POSTURE: flexed trunk   PALPATION: Palpation: TTP at medial knee.   Patellar Mobility: mild decrease sup/inf  LOWER EXTREMITY ROM:   AROM Right  eval Left  eval Left 08/22/22 Left 08/29/22 Left 09/22/22 Left 09/26/22 Left 09/30/22 Left 10/06/22 Left 10/24/22  Knee flexion 101 65 74 74 82 86 88 71 supine  86 seated 94 seated  Knee extension 0 0   1  0 0     PROM Left eval Left 08/22/22 Left 08/29/22 Left 09/12/22 Left 09/15/22 Left 09/22/22  Left 09/30/22 Left 10/06/22 Left 10/10/22 Left 10/19/22  Knee flexion 75 80 85 84 76 82 91 75 supine 91 seated 92 seated 93  Knee extension 0     0 0 0     (Blank rows = not tested)  LOWER EXTREMITY MMT:  MMT Right  eval Left    eval Left 08/29/22 Left 09/15/22 Left 10/06/22 Right 10/24/22 Left 10/24/22  Hip flexion 4 4 4+ 4+ 4+ 4+ 4+  Hip extension  4+  4+ 4+ 4 4  Hip abduction  4+  4+ 5 4 4   Hip adduction  4  4 4+ 4 4  Hip internal rotation      4+ 4+  Hip external rotation      4+ 4  Knee flexion 5 4+ in available range 4+ in available range 4+ 4+ 5 4+  Knee extension 5 4+ 4+ 5 5 5 5   Ankle dorsiflexion 5 4+  4+ 5 5 4+   (Blank rows = not  tested)  FUNCTIONAL TESTS:  5 times sit to stand: 24.87 sec with bil UE support (eval), 24.31 sec w/ no UE support (09/15/22), 15.40 sec no UE support (10/06/22) Timed up and go (TUG): 27.08 no AD, 13.19 sec no AD (10/06/22)  GAIT: Distance walked: 60 Assistive device utilized: Single point cane Level of assistance: Modified independence Comments: decreased step length and heel strike left   TODAY'S TREATMENT:                                                                                                                              DATE:    10/24/22 THERAPEUTIC EXERCISE: to improve flexibility, strength and mobility.  Verbal and tactile cues throughout for technique. NuStep - L5 x 7 min (S7/H8) Standing L/R GTB 4-way SLR x 10 each direction, 2 pole A for balance BATCA Leg press 15# 2 x10 - PT spotting to prevent plate pushing into too much knee flexion Standing L hip flexion march + DF with looped GTB at mid/forefoot x 10  THERAPEUTIC ACTIVITIES: L knee ROM LE MMT   10/19/22 THERAPEUTIC EXERCISE: to improve flexibility, strength and mobility.  Verbal and tactile cues throughout for technique. NuStep - L5 x 7 min (S7/H8) Step up and over 6' step x 10 L LE Knee flexion stretch on 9' stool L x 15 Wall squat 10x- mirror used  Seated HS curl GTB x 20   Manual Therapy: to decrease muscle spasm, pain and improve mobility.  L knee PROM with distraction and tibia IR L knee knee AP mobs grade III-IV in sitting   10/17/22 THERAPEUTIC EXERCISE: to improve flexibility, strength and mobility.  Verbal and tactile cues throughout for technique. NuStep - L5 x 6 min (S7/H8) 6" L fwd step-up x 10 Standing knee flexion mobilization on 16" step 10 x 10" 6" L fwd step-up and over x 10, UE support on widow ledge and back of chair Counter squat  10 x 5" Counter squat with L foot on 4" step 10 x 5" Standing L 4-way GTB SLR x 10, UE support on back of chair Seated L GTB HS curl x 10 Seated L GTB  LAQ x 10    PATIENT EDUCATION:   Education details: HEP update - standing 4-way SLR with GTB Person educated: Patient Education method: Explanation, Demonstration, Verbal cues, and Handouts Education comprehension: verbalized understanding, returned demonstration, and verbal cues required  HOME EXERCISE PROGRAM: Access Code: [email or web address] URL: [email or web address] Date: 10/24/2022 Prepared by: Annie Paras  Exercises - Sit to Stand  - 3 x daily - 7 x weekly - 1-2 sets - 10 reps - Seated Long Arc Quad  - 2 x daily - 7 x weekly - 3 sets - 10 reps - 5 sec hold - Long Sitting Inferior Patellar Glide  - 2 x daily - 7 x weekly - 2 sets - 10 reps - 3 sec hold - Long Sitting Superior Patellar Glide  - 2 x daily - 7 x weekly - 2 sets - 10 reps - 3 sec hold - Long Sitting Medial Patellar Glide  - 2 x daily - 7 x weekly - 2 sets - 10 reps - 3 sec hold - Long Sitting Lateral Patellar Glide  - 2 x daily - 7 x weekly - 2 sets - 10 reps - 3 sec hold - Prone Quadriceps Stretch with Strap (Mirrored)  - 3 x daily - 7 x weekly - 1 sets - 3 reps - 30-60 sec hold - Supine Quadriceps Stretch with Strap on Table  - 3 x daily - 7 x weekly - 3 reps - 30-60 sec hold - Supine Heel Slide with Strap  - 3 x daily - 7 x weekly - 2 sets - 10 reps - 3 sec hold - Supine 90/90 Knee Flexion/Extension  - 3 x daily - 7 x weekly - 2 sets - 10 reps - 3 sec hold - Knee Pendulum Swings  - 3 x daily - 7 x weekly - 2 sets - 10 reps - 3 sec hold - Seated Heel Slide  - 3 x daily - 7 x weekly - 2 sets - 10 reps - 3 sec hold - Seated Knee Flexion AAROM (Mirrored)  - 3 x daily - 7 x weekly - 2 sets - 10 reps - max hold - Seated Knee Flexion Stretch (Mirrored)  - 3 x daily - 7 x weekly - 2 sets - 10 reps - max hold - Standing Knee Flexion Stretch on Step  - 3 x daily - 7 x weekly - 2 sets - 10 reps - 3 sec hold - Standing Hip Flexion with Anchored Resistance and Chair Support  - 1 x daily - 3 x weekly - 2 sets - 10 reps - 3  sec hold - Standing Hip Adduction with Anchored Resistance  - 1 x daily - 3 x weekly - 2 sets - 10 reps - 3 sec hold - Standing Hip Extension with Anchored Resistance  - 1 x daily - 3 x weekly - 2 sets - 10 reps - 3 sec hold - Standing Hip Abduction with Anchored Resistance  - 1 x daily - 3 x weekly - 2 sets - 10 reps - 3 sec hold  Patient Education - Scar Massage   ASSESSMENT:  CLINICAL IMPRESSION:  Kristina "[name]" reports she has weaned herself from her [facility] and feels confident walking w/o it. MMT revealing continued proximal weakness,  therefore continued strengthening targeting proximal stability as well as SLS balance - HEP updated to include standing 4-way SLR with GTB. Introduced leg press machine to help with left knee flexion range of motion as well as quad and hip extensor strengthening - assistance needed to help her place her feet on the plate and close guarding to ensure she did not allow the plate pressure needed too much flexion. L knee flexion range of motion continues to gradually improve.  [name] will continue to benefit from skilled PT to address above ROM and strength deficits to improve mobility and activity tolerance with functional range of motion and good LE stability.   OBJECTIVE IMPAIRMENTS: Abnormal gait, decreased activity tolerance, decreased ROM, decreased strength, increased edema, increased muscle spasms, impaired flexibility, postural dysfunction, and pain.   ACTIVITY LIMITATIONS: sitting, standing, squatting, dressing, and locomotion level  PARTICIPATION LIMITATIONS: driving  PERSONAL FACTORS: 1-2 comorbidities: R TKR, back surgery  are also affecting patient's functional outcome.   REHAB POTENTIAL: Excellent  CLINICAL DECISION MAKING: Stable/uncomplicated  EVALUATION COMPLEXITY: Low  GOALS: Goals reviewed with patient? Yes   SHORT TERM GOALS: Target date: 08/31/2022    Independent with initial HEP. Baseline:  Goal status: MET  08/25/22  LONG TERM GOALS:  Target date: 10/12/2022, extended to 11/03/22   Independent with advanced/ongoing HEP to improve outcomes and carryover.  Baseline:  Goal status: IN PROGRESS  10/24/22 - Met for current HEP, but updated today  2.  Kristina Burns will demonstrate L knee flexion to 115 deg to ascend/descend stairs. Baseline: AROM - 65, PROM - 75 Goal status: IN PROGRESS  10/24/22 - L knee flexion AROM 94  3.    Kristina Burns will be able to ambulate 600' safely with LRAD and normal gait pattern to access community.  Baseline:  Goal status: IN PROGRESS  10/06/22 - pt continues to require cues for increased L hip and knee flexion and avoidance of L hip hike  4.  Kristina Burns will be able to ascend/descend 3 stairs with 1 HR and reciprocal step pattern safely to access home and community.  Baseline:  Goal status:PARTIALLY MET  10/06/22 - Reciprocal going up but requires step to pattern going down.   5.  Kristina Burns will demonstrate </= 25 sec on TUG to demonstrate decreased risk of falls.   Baseline: 27.08 sec with no AD Goal status: MET and REVISED (See 5a below)  09/30/22 - 15.5 sec  5a.  Patient will demonstrate decreased TUG time to </= 13.5 sec to decrease risk for falls with transitional mobility. Baseline: 15.5 sec w/o AD (09/30/22) Goal status: MET  10/06/22 - 13.19 seconds  6.  Kristina Burns will demonstrate improved functional LE strength by completing 5x STS in < 19  seconds.  (MCID 5 seconds) Baseline: 24.87 sec with bil UE support Goal status: MET  10/06/22 - 15.40 seconds w/o UE assist  7.  Kristina Burns will report 51/80 on LEFS to demonstrate improved function.   Baseline: 42 / 80 = 52.5 % Goal status: MET  10/06/22 - 70/80=87.5%   PLAN:  PT FREQUENCY: 2-3x/week   PT DURATION: 6 weeks   PLANNED INTERVENTIONS: Therapeutic exercises, Therapeutic activity, Neuromuscular re-education, Balance training, Gait training, Patient/Family education, Self Care, Joint mobilization, Stair  training, Aquatic Therapy, Dry Needling, Electrical stimulation, Cryotherapy, Moist heat, scar mobilization, Taping, Vasopneumatic device, and Manual therapy  PLAN FOR NEXT SESSION: L knee ROM/stretching focusing on L knee flexion and increased knee flexibility (does better in sitting); MT  as indicated; proximal LE strengthening; gait training to normalize gait pattern as indicated    Percival Spanish, PT 10/24/2022, 9:45 AM

## 2022-10-27 ENCOUNTER — Encounter: Payer: Self-pay | Admitting: Physical Therapy

## 2022-10-27 ENCOUNTER — Ambulatory Visit: Payer: Medicare Other | Admitting: Physical Therapy

## 2022-10-27 DIAGNOSIS — M25662 Stiffness of left knee, not elsewhere classified: Secondary | ICD-10-CM | POA: Diagnosis not present

## 2022-10-27 DIAGNOSIS — M6281 Muscle weakness (generalized): Secondary | ICD-10-CM

## 2022-10-27 DIAGNOSIS — M25562 Pain in left knee: Secondary | ICD-10-CM

## 2022-10-27 NOTE — Therapy (Signed)
OUTPATIENT PHYSICAL THERAPY TREATMENT      Patient Name: Kristina Burns MRN: [id number] DOB:1946-01-11, 77 y.o., female Today's Date: 10/27/2022  END OF SESSION:  PT End of Session - 10/27/22 0925     Visit Number 25    Date for PT Re-Evaluation 11/03/22    Authorization Type BCBS MCR    Progress Note Due on Visit 30    PT Start Time 0925    PT Stop Time 1018    PT Time Calculation (min) 53 min    Activity Tolerance Patient tolerated treatment well    Behavior During Therapy WFL for tasks assessed/performed              Past Medical History:  Diagnosis Date   Arthritis    Asthma    High cholesterol    Past Surgical History:  Procedure Laterality Date   ABDOMINAL HYSTERECTOMY     BACK SURGERY     TOTAL KNEE ARTHROPLASTY Right 08/21/2018   Procedure: RIGHT TOTAL KNEE ARTHROPLASTY;  Surgeon: Mcarthur Rossetti, MD;  Location: [place];  Service: Orthopedics;  Laterality: Right;   TOTAL KNEE ARTHROPLASTY Left 07/14/2022   Procedure: LEFT TOTAL KNEE ARTHROPLASTY;  Surgeon: Mcarthur Rossetti, MD;  Location: [place];  Service: Orthopedics;  Laterality: Left;   Patient Active Problem List   Diagnosis Date Noted   OA (osteoarthritis) of knee 07/14/2022   Status post total left knee replacement 07/14/2022   Status post total knee replacement, right 08/21/2018   Chronic pain of both knees 07/10/2017    PCP: Tisovec, Fransico Him, MD  REFERRING PROVIDER: Pete Pelt, PA-C  REFERRING DIAG: [phone] (ICD-10-CM) - Status post total left knee replacement   THERAPY DIAG:  Stiffness of left knee, not elsewhere classified  Acute pain of left knee  Muscle weakness (generalized)  RATIONALE FOR EVALUATION AND TREATMENT: Rehabilitation  ONSET DATE: 07/14/22  NEXT MD VISIT: 11/03/22   SUBJECTIVE:   SUBJECTIVE STATEMENT:  Pt walking with more of a limp today but denies any pain or issues with her knee.  PAIN:  Are you having pain? No  PERTINENT HISTORY: R  TKR, back surgery, asthma  PRECAUTIONS: None  WEIGHT BEARING RESTRICTIONS: No  FALLS:  Has patient fallen in last 6 months? No  LIVING ENVIRONMENT: Lives with: lives with their spouse Lives in: House/apartment Stairs: Yes: External: 2-3 steps; can reach both Has following equipment at home: Single point cane and Walker - 2 wheeled  OCCUPATION: retired  PLOF: Independent  PATIENT GOALS: get back to normal, wants to drive  OBJECTIVE:   DIAGNOSTIC FINDINGS: N/A  PATIENT SURVEYS:  LEFS 42/80 = 52.5% (eval); 49/80 = 61.3% (09/15/22)  COGNITION: Overall cognitive status: Within functional limits for tasks assessed     SENSATION: WFL  EDEMA:  Circumferential: R  45.5  cm;  L 49 cm  MUSCLE LENGTH: HS: mild tightness Quads:marked bil ITB: NT Piriformis: NT Hip Flexors:NT Heelcords: WFL  POSTURE: flexed trunk   PALPATION: Palpation: TTP at medial knee.   Patellar Mobility: mild decrease sup/inf  LOWER EXTREMITY ROM:   AROM Right  eval Left  eval Left 08/22/22 Left 08/29/22 Left 09/22/22 Left 09/26/22 Left 09/30/22 Left 10/06/22 Left 10/24/22  Knee flexion 101 65 74 74 82 86 88 71 supine  86 seated 94 seated  Knee extension 0 0   1  0 0     PROM Left eval Left 08/22/22 Left 08/29/22 Left 09/12/22 Left 09/15/22 Left 09/22/22 Left 09/30/22 Left 10/06/22  Left 10/10/22 Left 10/19/22  Knee flexion 75 80 85 84 76 82 91 75 supine 91 seated 92 seated 93  Knee extension 0     0 0 0     (Blank rows = not tested)  LOWER EXTREMITY MMT:  MMT Right  eval Left    eval Left 08/29/22 Left 09/15/22 Left 10/06/22 Right 10/24/22 Left 10/24/22  Hip flexion 4 4 4+ 4+ 4+ 4+ 4+  Hip extension  4+  4+ 4+ 4 4  Hip abduction  4+  4+ 5 4 4   Hip adduction  4  4 4+ 4 4  Hip internal rotation      4+ 4+  Hip external rotation      4+ 4  Knee flexion 5 4+ in available range 4+ in available range 4+ 4+ 5 4+  Knee extension 5 4+ 4+ 5 5 5 5   Ankle dorsiflexion 5 4+  4+ 5 5 4+   (Blank rows = not  tested)  FUNCTIONAL TESTS:  5 times sit to stand: 24.87 sec with bil UE support (eval), 24.31 sec w/ no UE support (09/15/22), 15.40 sec no UE support (10/06/22) Timed up and go (TUG): 27.08 no AD, 13.19 sec no AD (10/06/22)  GAIT: Distance walked: 60 Assistive device utilized: Single point cane Level of assistance: Modified independence Comments: decreased step length and heel strike left   TODAY'S TREATMENT:                                                                                                                              DATE:    10/27/22 THERAPEUTIC EXERCISE: to improve flexibility, strength and mobility.  Verbal and tactile cues throughout for technique. NuStep - L5 x 7 min (S7/H8) STS w/o UE assist x 5 - cues for even alignment of feet Seated HS curl with foot on 8" FR x 20 - cues for heel drop toward floor (ankle DF) to increase knee flexion  Hooklying 90/90 L knee flexion and extension, pausing at end flexion ROM for stretch 10 x 5" R/L 6" fwd step-up + contralateral high knee x 10 each 8" L fwd step-up and over x 10, UE support on window ledge and back of chair Split squat with L foot behind and toes on 8" step for active quad stretch at end of above step-down x 10 B GTB side-stepping 2 x 20 ft Fwd/back monster walk with looped GTB at ankles 2 x 20 ft   10/24/22 THERAPEUTIC EXERCISE: to improve flexibility, strength and mobility.  Verbal and tactile cues throughout for technique. NuStep - L5 x 7 min (S7/H8) Standing L/R GTB 4-way SLR x 10 each direction, 2 pole A for balance BATCA Leg press 15# 2 x10 - PT spotting to prevent plate pushing into too much knee flexion Standing L hip flexion march + DF with looped GTB at mid/forefoot x 10  THERAPEUTIC ACTIVITIES: L knee ROM LE MMT   10/19/22  THERAPEUTIC EXERCISE: to improve flexibility, strength and mobility.  Verbal and tactile cues throughout for technique. NuStep - L5 x 7 min (S7/H8) Step up and over 6' step x 10  L LE Knee flexion stretch on 9' stool L x 15 Wall squat 10x- mirror used  Seated HS curl GTB x 20   Manual Therapy: to decrease muscle spasm, pain and improve mobility.  L knee PROM with distraction and tibia IR L knee knee AP mobs grade III-IV in sitting   PATIENT EDUCATION:   Education details: HEP update - standing 4-way SLR with GTB Person educated: Patient Education method: Explanation, Demonstration, Verbal cues, and Handouts Education comprehension: verbalized understanding, returned demonstration, and verbal cues required  HOME EXERCISE PROGRAM: Access Code: [email or web address] URL: [email or web address] Date: 10/27/2022 Prepared by: Annie Paras  Exercises - Sit to Stand  - 3 x daily - 7 x weekly - 1-2 sets - 10 reps - Long Sitting Inferior Patellar Glide  - 2 x daily - 7 x weekly - 2 sets - 10 reps - 3 sec hold - Long Sitting Superior Patellar Glide  - 2 x daily - 7 x weekly - 2 sets - 10 reps - 3 sec hold - Long Sitting Medial Patellar Glide  - 2 x daily - 7 x weekly - 2 sets - 10 reps - 3 sec hold - Long Sitting Lateral Patellar Glide  - 2 x daily - 7 x weekly - 2 sets - 10 reps - 3 sec hold - Prone Quadriceps Stretch with Strap (Mirrored)  - 3 x daily - 7 x weekly - 1 sets - 3 reps - 30-60 sec hold - Supine Quadriceps Stretch with Strap on Table  - 3 x daily - 7 x weekly - 3 reps - 30-60 sec hold - Supine 90/90 Knee Flexion/Extension  - 3 x daily - 7 x weekly - 2 sets - 10 reps - 3 sec hold - Knee Pendulum Swings  - 3 x daily - 7 x weekly - 2 sets - 10 reps - 3 sec hold - Seated Heel Slide  - 3 x daily - 7 x weekly - 2 sets - 10 reps - 3 sec hold - Seated Knee Flexion AAROM (Mirrored)  - 3 x daily - 7 x weekly - 2 sets - 10 reps - max hold - Seated Knee Flexion Stretch (Mirrored)  - 3 x daily - 7 x weekly - 2 sets - 10 reps - max hold - Standing Knee Flexion Stretch on Step  - 3 x daily - 7 x weekly - 2 sets - 10 reps - 3 sec hold - Standing Hip Flexion with  Anchored Resistance and Chair Support  - 1 x daily - 3 x weekly - 2 sets - 10 reps - 3 sec hold - Standing Hip Adduction with Anchored Resistance  - 1 x daily - 3 x weekly - 2 sets - 10 reps - 3 sec hold - Standing Hip Extension with Anchored Resistance  - 1 x daily - 3 x weekly - 2 sets - 10 reps - 3 sec hold - Standing Hip Abduction with Anchored Resistance  - 1 x daily - 3 x weekly - 2 sets - 10 reps - 3 sec hold - Side Stepping with Resistance at Ankles  - 1 x daily - 3 x weekly - 2 sets - 10 reps - Band Walks  - 1 x daily - 3 x weekly - 2 sets - 10 reps  Patient Education - Scar Massage   ASSESSMENT:  CLINICAL IMPRESSION:  Kristina "[name]" is hoping to be able to transition to her HEP as of next week, therefore today's session targeting HEP review, update and consolidation. Discontinued some the earlier basis exercises, reviewed the on going exercises targeting increasing L knee ROM and offered alternative versions of strengthening exercises to vary strengthening focus and avoid overuse irritation. [name] verbalized understanding and was able to perform good return demonstration of exercises reviewed today. Will plan for final HEP review and goal assessment next visit and anticipate transition to HEP with probable 30-day hold.  OBJECTIVE IMPAIRMENTS: Abnormal gait, decreased activity tolerance, decreased ROM, decreased strength, increased edema, increased muscle spasms, impaired flexibility, postural dysfunction, and pain.   ACTIVITY LIMITATIONS: sitting, standing, squatting, dressing, and locomotion level  PARTICIPATION LIMITATIONS: driving  PERSONAL FACTORS: 1-2 comorbidities: R TKR, back surgery  are also affecting patient's functional outcome.   REHAB POTENTIAL: Excellent  CLINICAL DECISION MAKING: Stable/uncomplicated  EVALUATION COMPLEXITY: Low  GOALS: Goals reviewed with patient? Yes   SHORT TERM GOALS: Target date: 08/31/2022    Independent with initial HEP. Baseline:  Goal  status: MET  08/25/22  LONG TERM GOALS: Target date: 10/12/2022, extended to 11/03/22   Independent with advanced/ongoing HEP to improve outcomes and carryover.  Baseline:  Goal status: IN PROGRESS  10/27/22 - HEP reviewed and updated in prep for anticipated transition to HEP next week  2.  Kristina Burns will demonstrate L knee flexion to 115 deg to ascend/descend stairs. Baseline: AROM - 65, PROM - 75 Goal status: IN PROGRESS  10/24/22 - L knee flexion AROM 94  3.    Kristina Burns will be able to ambulate 600' safely with LRAD and normal gait pattern to access community.  Baseline:  Goal status: IN PROGRESS  10/06/22 - pt continues to require cues for increased L hip and knee flexion and avoidance of L hip hike  4.  Kristina Burns will be able to ascend/descend 3 stairs with 1 HR and reciprocal step pattern safely to access home and community.  Baseline:  Goal status:PARTIALLY MET  10/06/22 - Reciprocal going up but requires step to pattern going down.   5.  Kristina Burns will demonstrate </= 25 sec on TUG to demonstrate decreased risk of falls.   Baseline: 27.08 sec with no AD Goal status: MET and REVISED (See 5a below)  09/30/22 - 15.5 sec  5a.  Patient will demonstrate decreased TUG time to </= 13.5 sec to decrease risk for falls with transitional mobility. Baseline: 15.5 sec w/o AD (09/30/22) Goal status: MET  10/06/22 - 13.19 seconds  6.  Kristina Burns will demonstrate improved functional LE strength by completing 5x STS in < 19  seconds.  (MCID 5 seconds) Baseline: 24.87 sec with bil UE support Goal status: MET  10/06/22 - 15.40 seconds w/o UE assist  7.  Kristina Burns will report 51/80 on LEFS to demonstrate improved function.   Baseline: 42 / 80 = 52.5 % Goal status: MET  10/06/22 - 70/80=87.5%   PLAN:  PT FREQUENCY: 2-3x/week   PT DURATION: 6 weeks   PLANNED INTERVENTIONS: Therapeutic exercises, Therapeutic activity, Neuromuscular re-education, Balance training,  Gait training, Patient/Family education, Self Care, Joint mobilization, Stair training, Aquatic Therapy, Dry Needling, Electrical stimulation, Cryotherapy, Moist heat, scar mobilization, Taping, Vasopneumatic device, and Manual therapy  PLAN FOR NEXT SESSION: final HEP review and goal assessment - anticipate transition to HEP +/- 30-day hold  Percival Spanish, PT 10/27/2022,  4:50 PM

## 2022-11-03 ENCOUNTER — Encounter: Payer: Self-pay | Admitting: Physical Therapy

## 2022-11-03 ENCOUNTER — Encounter: Payer: Self-pay | Admitting: Orthopaedic Surgery

## 2022-11-03 ENCOUNTER — Ambulatory Visit (INDEPENDENT_AMBULATORY_CARE_PROVIDER_SITE_OTHER): Payer: Medicare Other | Admitting: Orthopaedic Surgery

## 2022-11-03 ENCOUNTER — Ambulatory Visit: Payer: Medicare Other | Admitting: Physical Therapy

## 2022-11-03 DIAGNOSIS — Z96652 Presence of left artificial knee joint: Secondary | ICD-10-CM | POA: Diagnosis not present

## 2022-11-03 DIAGNOSIS — Z96651 Presence of right artificial knee joint: Secondary | ICD-10-CM

## 2022-11-03 DIAGNOSIS — M6281 Muscle weakness (generalized): Secondary | ICD-10-CM

## 2022-11-03 DIAGNOSIS — M25662 Stiffness of left knee, not elsewhere classified: Secondary | ICD-10-CM | POA: Diagnosis not present

## 2022-11-03 DIAGNOSIS — M25562 Pain in left knee: Secondary | ICD-10-CM | POA: Diagnosis not present

## 2022-11-03 NOTE — Therapy (Addendum)
OUTPATIENT PHYSICAL THERAPY TREATMENT/DISCHARGE SUMMARY  Progress Note  Reporting Period 10/06/2022 to 11/03/2022   See note below for Objective Data and Assessment of Progress/Goals.    Patient Name: Kristina Burns MRN: [id number] DOB:1946/07/04, 77 y.o., female Today's Date: 11/03/2022  END OF SESSION:  PT End of Session - 11/03/22 0933     Visit Number 26    Date for PT Re-Evaluation 11/03/22    Authorization Type [employer] MCR    Progress Note Due on Visit 30    PT Start Time 0933    PT Stop Time 1011    PT Time Calculation (min) 38 min    Activity Tolerance Patient tolerated treatment well    Behavior During Therapy WFL for tasks assessed/performed               Past Medical History:  Diagnosis Date   Arthritis    Asthma    High cholesterol    Past Surgical History:  Procedure Laterality Date   ABDOMINAL HYSTERECTOMY     BACK SURGERY     TOTAL KNEE ARTHROPLASTY Right 08/21/2018   Procedure: RIGHT TOTAL KNEE ARTHROPLASTY;  Surgeon: Kathryne Hitch, MD;  Location: [facility] OR;  Service: Orthopedics;  Laterality: Right;   TOTAL KNEE ARTHROPLASTY Left 07/14/2022   Procedure: LEFT TOTAL KNEE ARTHROPLASTY;  Surgeon: Kathryne Hitch, MD;  Location: [facility] OR;  Service: Orthopedics;  Laterality: Left;   Patient Active Problem List   Diagnosis Date Noted   OA (osteoarthritis) of knee 07/14/2022   Status post total left knee replacement 07/14/2022   Status post total knee replacement, right 08/21/2018   Chronic pain of both knees 07/10/2017    PCP: Tisovec, Adelfa Koh, MD  REFERRING PROVIDER: Kirtland Bouchard, PA-C  REFERRING DIAG: [phone] (ICD-10-CM) - Status post total left knee replacement   THERAPY DIAG:  Stiffness of left knee, not elsewhere classified  Acute pain of left knee  Muscle weakness (generalized)  RATIONALE FOR EVALUATION AND TREATMENT: Rehabilitation  ONSET DATE: 07/14/22  NEXT MD VISIT: 11/03/22   SUBJECTIVE:   SUBJECTIVE  STATEMENT:  Pt feels like her knee is doing well - a little stiff/tight to start but gets better as she gets moving.  PAIN:  Are you having pain? No  PERTINENT HISTORY: R TKR, back surgery, asthma  PRECAUTIONS: None  WEIGHT BEARING RESTRICTIONS: No  FALLS:  Has patient fallen in last 6 months? No  LIVING ENVIRONMENT: Lives with: lives with their spouse Lives in: House/apartment Stairs: Yes: External: 2-3 steps; can reach both Has following equipment at home: Single point cane and Walker - 2 wheeled  OCCUPATION: retired  PLOF: Independent  PATIENT GOALS: get back to normal, wants to drive  OBJECTIVE:   DIAGNOSTIC FINDINGS: N/A  PATIENT SURVEYS:  LEFS 42/80 = 52.5% (eval); 49/80 = 61.3% (09/15/22)  COGNITION: Overall cognitive status: Within functional limits for tasks assessed     SENSATION: WFL  EDEMA:  Circumferential: R  45.5  cm;  L 49 cm  MUSCLE LENGTH: HS: mild tightness Quads:marked bil ITB: NT Piriformis: NT Hip Flexors:NT Heelcords: WFL  POSTURE: flexed trunk   PALPATION: Palpation: TTP at medial knee.   Patellar Mobility: mild decrease sup/inf  LOWER EXTREMITY ROM:   AROM Right  eval Left  eval Left 08/22/22 Left 08/29/22 Left 09/22/22 Left 09/26/22 Left 09/30/22 Left 10/06/22 Left 10/24/22 Left 11/03/22  Knee flexion 101 65 74 74 82 86 88 71 supine 86 seated 94 seated 95 seated & supine  Knee  extension 0 0   1  0 0  0    PROM Left eval Left 08/22/22 Left 08/29/22 Left 09/12/22 Left 09/15/22 Left 09/22/22 Left 09/30/22 Left 10/06/22 Left 10/10/22 Left 10/19/22 Left 11/03/22  Knee flexion 75 80 85 84 76 82 91 75 supine 91 seated 92 seated 93 98  Knee extension 0     0 0 0   0   (Blank rows = not tested)  LOWER EXTREMITY MMT:  MMT Right  eval Left    eval Left 08/29/22 Left 09/15/22 Left 10/06/22 Right 10/24/22 Left 10/24/22 Right 11/03/22 Left 11/03/22  Hip flexion 4 4 4+ 4+ 4+ 4+ 4+ 5 5  Hip extension  4+  4+ 4+ 4 4 4 4   Hip abduction  4+  4+ 5 4 4  4+ 4+   Hip adduction  4  4 4+ 4 4 4+ 4  Hip internal rotation      4+ 4+ 5 5  Hip external rotation      4+ 4 5 4+  Knee flexion 5 4+ in available range 4+ in available range 4+ 4+ 5 4+ 5 5  Knee extension 5 4+ 4+ 5 5 5 5 5 5   Ankle dorsiflexion 5 4+  4+ 5 5 4+ 5 5   (Blank rows = not tested)  FUNCTIONAL TESTS:  5 times sit to stand: 24.87 sec with bil UE support (eval), 24.31 sec w/ no UE support (09/15/22), 15.40 sec no UE support (10/06/22) Timed up and go (TUG): 27.08 no AD, 13.19 sec no AD (10/06/22)  GAIT: Distance walked: 60 Assistive device utilized: Single point cane Level of assistance: Modified independence Comments: decreased step length and heel strike left   TODAY'S TREATMENT:                                                                                                                              DATE:    11/03/22 THERAPEUTIC EXERCISE: to improve flexibility, strength and mobility.  Verbal and tactile cues throughout for technique. Rec Bike - partial revolutions/rocking for L knee flexion ROM  GAIT TRAINING: To normalize gait pattern. Stairs: Level of Assistance: Modified independence Stair Negotiation Technique: Alternating Pattern  Forwards with Single Rail on Right Number of Stairs: 15  Height of Stairs: 7  Comments: good symmetrical reciprocal ascent/descent  MANUAL THERAPY: To promote improved flexibility, improved joint mobility, and increased ROM.  Supine gravity assisted L knee flexion PROM with distraction with L LE supported over PT's arm + manual tibial rotation  MET contract/relax into L knee flexion  THERAPEUTIC ACTIVITIES: L knee ROM & B LE MMT assessment Goal assessment   10/27/22 THERAPEUTIC EXERCISE: to improve flexibility, strength and mobility.  Verbal and tactile cues throughout for technique. NuStep - L5 x 7 min (S7/H8) STS w/o UE assist x 5 - cues for even alignment of feet Seated HS curl with foot on 8" FR x 20 - cues for  heel drop toward  floor (ankle DF) to increase knee flexion  Hooklying 90/90 L knee flexion and extension, pausing at end flexion ROM for stretch 10 x 5" R/L 6" fwd step-up + contralateral high knee x 10 each 8" L fwd step-up and over x 10, UE support on window ledge and back of chair Split squat with L foot behind and toes on 8" step for active quad stretch at end of above step-down x 10 B GTB side-stepping 2 x 20 ft Fwd/back monster walk with looped GTB at ankles 2 x 20 ft   10/24/22 THERAPEUTIC EXERCISE: to improve flexibility, strength and mobility.  Verbal and tactile cues throughout for technique. NuStep - L5 x 7 min (S7/H8) Standing L/R GTB 4-way SLR x 10 each direction, 2 pole A for balance BATCA Leg press 15# 2 x10 - PT spotting to prevent plate pushing into too much knee flexion Standing L hip flexion march + DF with looped GTB at mid/forefoot x 10  THERAPEUTIC ACTIVITIES: L knee ROM LE MMT   PATIENT EDUCATION:   Education details: HEP update - standing 4-way SLR with GTB Person educated: Patient Education method: Explanation, Demonstration, Verbal cues, and Handouts Education comprehension: verbalized understanding, returned demonstration, and verbal cues required  HOME EXERCISE PROGRAM: Access Code: [email or web address] URL: [email or web address] Date: 10/27/2022 Prepared by: Glenetta Hew  Exercises - Sit to Stand  - 3 x daily - 7 x weekly - 1-2 sets - 10 reps - Long Sitting Inferior Patellar Glide  - 2 x daily - 7 x weekly - 2 sets - 10 reps - 3 sec hold - Long Sitting Superior Patellar Glide  - 2 x daily - 7 x weekly - 2 sets - 10 reps - 3 sec hold - Long Sitting Medial Patellar Glide  - 2 x daily - 7 x weekly - 2 sets - 10 reps - 3 sec hold - Long Sitting Lateral Patellar Glide  - 2 x daily - 7 x weekly - 2 sets - 10 reps - 3 sec hold - Prone Quadriceps Stretch with Strap (Mirrored)  - 3 x daily - 7 x weekly - 1 sets - 3 reps - 30-60 sec hold - Supine Quadriceps Stretch with  Strap on Table  - 3 x daily - 7 x weekly - 3 reps - 30-60 sec hold - Supine 90/90 Knee Flexion/Extension  - 3 x daily - 7 x weekly - 2 sets - 10 reps - 3 sec hold - Knee Pendulum Swings  - 3 x daily - 7 x weekly - 2 sets - 10 reps - 3 sec hold - Seated Heel Slide  - 3 x daily - 7 x weekly - 2 sets - 10 reps - 3 sec hold - Seated Knee Flexion AAROM (Mirrored)  - 3 x daily - 7 x weekly - 2 sets - 10 reps - max hold - Seated Knee Flexion Stretch (Mirrored)  - 3 x daily - 7 x weekly - 2 sets - 10 reps - max hold - Standing Knee Flexion Stretch on Step  - 3 x daily - 7 x weekly - 2 sets - 10 reps - 3 sec hold - Standing Hip Flexion with Anchored Resistance and Chair Support  - 1 x daily - 3 x weekly - 2 sets - 10 reps - 3 sec hold - Standing Hip Adduction with Anchored Resistance  - 1 x daily - 3 x weekly - 2 sets - 10 reps - 3 sec  hold - Standing Hip Extension with Anchored Resistance  - 1 x daily - 3 x weekly - 2 sets - 10 reps - 3 sec hold - Standing Hip Abduction with Anchored Resistance  - 1 x daily - 3 x weekly - 2 sets - 10 reps - 3 sec hold - Side Stepping with Resistance at Ankles  - 1 x daily - 3 x weekly - 2 sets - 10 reps - Band Walks  - 1 x daily - 3 x weekly - 2 sets - 10 reps  Patient Education - Scar Massage   ASSESSMENT:  CLINICAL IMPRESSION:  Shaunie "Fannie Knee" is pleased with her progress and feels ready to transition to her HEP. She is walking w/o an AD and is able to navigate stairs reciprocally w/o significant deviations. Overall B LE strength is now grossly 4+ to 5/5, with exceptions of B hip extension and L hip ER at 4/5. Current L knee AROM now 0-95 and PROM 0-98. Fannie Knee has met all of her LTGs with the exception of L knee flexion ROM goal. She would like to finish PT and transition to her HEP but will await f/u with MD this pm to determine if MD desires any further PT.  OBJECTIVE IMPAIRMENTS: Abnormal gait, decreased activity tolerance, decreased ROM, decreased strength,  increased edema, increased muscle spasms, impaired flexibility, postural dysfunction, and pain.   ACTIVITY LIMITATIONS: sitting, standing, squatting, dressing, and locomotion level  PARTICIPATION LIMITATIONS: driving  PERSONAL FACTORS: 1-2 comorbidities: R TKR, back surgery  are also affecting patient's functional outcome.   REHAB POTENTIAL: Excellent  CLINICAL DECISION MAKING: Stable/uncomplicated  EVALUATION COMPLEXITY: Low  GOALS: Goals reviewed with patient? Yes   SHORT TERM GOALS: Target date: 08/31/2022    Independent with initial HEP. Baseline:  Goal status: MET  08/25/22  LONG TERM GOALS: Target date: 10/12/2022, extended to 11/03/22   Independent with advanced/ongoing HEP to improve outcomes and carryover.  Baseline:  Goal status: MET  11/03/22   2.  Arianys Hachey will demonstrate L knee flexion to 115 deg to ascend/descend stairs. Baseline: AROM - 65, PROM - 75 Goal status: NOT MET  11/03/22 - L knee flexion AROM 95, PROM 98  3.    Sanylah Iturralde will be able to ambulate 600' safely with LRAD and normal gait pattern to access community.  Baseline:  Goal status: MET  11/03/22  4.  Marikate Lory will be able to ascend/descend 3 stairs with 1 HR and reciprocal step pattern safely to access home and community.  Baseline:  Goal status: MET  11/03/22   5.  Dela Morga will demonstrate </= 25 sec on TUG to demonstrate decreased risk of falls.   Baseline: 27.08 sec with no AD Goal status: MET and REVISED (See 5a below)  09/30/22 - 15.5 sec  5a.  Patient will demonstrate decreased TUG time to </= 13.5 sec to decrease risk for falls with transitional mobility. Baseline: 15.5 sec w/o AD (09/30/22) Goal status: MET  10/06/22 - 13.19 seconds  6.  Skylen Staebell will demonstrate improved functional LE strength by completing 5x STS in < 19  seconds.  (MCID 5 seconds) Baseline: 24.87 sec with bil UE support Goal status: MET  10/06/22 - 15.40 seconds w/o UE assist  7.   Sheka Tague will report 51/80 on LEFS to demonstrate improved function.   Baseline: 42 / 80 = 52.5 % Goal status: MET  10/06/22 - 70/80 = 87.5%   PLAN:  PT FREQUENCY: 2-3x/week   PT  DURATION: 6 weeks   PLANNED INTERVENTIONS: Therapeutic exercises, Therapeutic activity, Neuromuscular re-education, Balance training, Gait training, Patient/Family education, Self Care, Joint mobilization, Stair training, Aquatic Therapy, Dry Needling, Electrical stimulation, Cryotherapy, Moist heat, scar mobilization, Taping, Vasopneumatic device, and Manual therapy  PLAN FOR NEXT SESSION:  transition to HEP + 30-day hold  Marry Guan, PT 11/03/2022, 12:10 PM  PHYSICAL THERAPY DISCHARGE SUMMARY  Visits from Start of Care: 26  Current functional level related to goals / functional outcomes: See above   Remaining deficits: See above   Education / Equipment: HEP   Patient agrees to discharge. Patient goals were partially met. Patient is being discharged due to being pleased with the current functional level.  Solon Palm, PT 12/21/22 8:19 AM [facility] [address] [place], [place] [address] [phone]  Fax: [phone]

## 2022-11-03 NOTE — Progress Notes (Signed)
The patient is now 4 months status post a left total knee arthroplasty.  We replaced her right knee several years ago.  Her postoperative course her left knee was a little bit rougher on her and she ended up needing a manipulation under anesthesia.  She has been pushing herself through therapy and a home exercise program as well.  She is an active 77 year old female.  She is looking forward to gardening as the weather gets better.  She is walking without an assistive device.  Her right knee that we did several years ago has full extension and flexes to about 95 degrees.  Her left knee which is the more recent knee from December surgery lacks full extension by just a few degrees and I can flex her to about 90 to 95 degrees.  She is pleased overall.  She will continue to increase her activities as comfort allows with no restrictions.  She will continue an aggressive home exercise program.  She understands the swelling will take a while for this to subside.  I would like to see her back in 6 months.  Will have a standing AP and lateral of her left more recent operative knee at that visit.

## 2023-03-03 DIAGNOSIS — E559 Vitamin D deficiency, unspecified: Secondary | ICD-10-CM | POA: Diagnosis not present

## 2023-03-03 DIAGNOSIS — E7800 Pure hypercholesterolemia, unspecified: Secondary | ICD-10-CM | POA: Diagnosis not present

## 2023-03-03 DIAGNOSIS — E785 Hyperlipidemia, unspecified: Secondary | ICD-10-CM | POA: Diagnosis not present

## 2023-03-03 DIAGNOSIS — Z1212 Encounter for screening for malignant neoplasm of rectum: Secondary | ICD-10-CM | POA: Diagnosis not present

## 2023-03-10 DIAGNOSIS — Z1339 Encounter for screening examination for other mental health and behavioral disorders: Secondary | ICD-10-CM | POA: Diagnosis not present

## 2023-03-10 DIAGNOSIS — I700 Atherosclerosis of aorta: Secondary | ICD-10-CM | POA: Diagnosis not present

## 2023-03-10 DIAGNOSIS — R82998 Other abnormal findings in urine: Secondary | ICD-10-CM | POA: Diagnosis not present

## 2023-03-10 DIAGNOSIS — Z1331 Encounter for screening for depression: Secondary | ICD-10-CM | POA: Diagnosis not present

## 2023-03-10 DIAGNOSIS — Z0000 Encounter for general adult medical examination without abnormal findings: Secondary | ICD-10-CM | POA: Diagnosis not present

## 2023-05-08 ENCOUNTER — Ambulatory Visit: Payer: Medicare Other | Admitting: Orthopaedic Surgery

## 2023-05-08 ENCOUNTER — Other Ambulatory Visit (INDEPENDENT_AMBULATORY_CARE_PROVIDER_SITE_OTHER): Payer: Self-pay

## 2023-05-08 ENCOUNTER — Encounter: Payer: Self-pay | Admitting: Orthopaedic Surgery

## 2023-05-08 DIAGNOSIS — Z96652 Presence of left artificial knee joint: Secondary | ICD-10-CM | POA: Diagnosis not present

## 2023-05-08 NOTE — Progress Notes (Signed)
The patient is well-known to [place].  We replaced her right knee in January 2020 and was replaced her left knee in December 2023.  She had a more difficult time with her left knee in terms of range of motion and did require a manipulation under anesthesia after that left knee replacement she says she has good range of motion and strength and is doing well.  On exam both knees have full extension.  Her right knee flexes to past 90 degrees but her left knee flexes to maybe 80 degrees.  She says that is fine with her and she is very pleased and is able to do all the things and activities that she would like to do.  Both knees feels ligamentously stable.  An AP and lateral of the left knee shows a well-seated total knee arthroplasty with no complicating features.  The AP view also shows the right knee looks normal.  At this point follow-up for her knees can be as needed.  If she does develop any issues at all she knows to not hesitate to reach out to [place] and let us know.

## 2023-06-12 DIAGNOSIS — Z23 Encounter for immunization: Secondary | ICD-10-CM | POA: Diagnosis not present

## 2024-03-15 DIAGNOSIS — E7800 Pure hypercholesterolemia, unspecified: Secondary | ICD-10-CM | POA: Diagnosis not present

## 2024-03-15 DIAGNOSIS — E559 Vitamin D deficiency, unspecified: Secondary | ICD-10-CM | POA: Diagnosis not present

## 2024-03-22 DIAGNOSIS — R82998 Other abnormal findings in urine: Secondary | ICD-10-CM | POA: Diagnosis not present

## 2024-03-22 DIAGNOSIS — R300 Dysuria: Secondary | ICD-10-CM | POA: Diagnosis not present

## 2024-03-22 DIAGNOSIS — Z1339 Encounter for screening examination for other mental health and behavioral disorders: Secondary | ICD-10-CM | POA: Diagnosis not present

## 2024-03-22 DIAGNOSIS — Z0000 Encounter for general adult medical examination without abnormal findings: Secondary | ICD-10-CM | POA: Diagnosis not present

## 2024-03-22 DIAGNOSIS — Z1331 Encounter for screening for depression: Secondary | ICD-10-CM | POA: Diagnosis not present

## 2024-03-25 ENCOUNTER — Other Ambulatory Visit: Payer: Self-pay | Admitting: Internal Medicine

## 2024-03-25 DIAGNOSIS — Z1231 Encounter for screening mammogram for malignant neoplasm of breast: Secondary | ICD-10-CM

## 2024-04-23 ENCOUNTER — Ambulatory Visit
Admission: RE | Admit: 2024-04-23 | Discharge: 2024-04-23 | Disposition: A | Discharge status: Home / Self Care | Source: Ambulatory Visit | Attending: Internal Medicine | Admitting: Internal Medicine

## 2024-04-23 DIAGNOSIS — Z1231 Encounter for screening mammogram for malignant neoplasm of breast: Secondary | ICD-10-CM

## 2024-05-24 DIAGNOSIS — Z23 Encounter for immunization: Secondary | ICD-10-CM | POA: Diagnosis not present

## 2024-05-28 DIAGNOSIS — L280 Lichen simplex chronicus: Secondary | ICD-10-CM | POA: Diagnosis not present

## 2024-05-28 DIAGNOSIS — I8311 Varicose veins of right lower extremity with inflammation: Secondary | ICD-10-CM | POA: Diagnosis not present

## 2024-05-28 DIAGNOSIS — I8312 Varicose veins of left lower extremity with inflammation: Secondary | ICD-10-CM | POA: Diagnosis not present

## 2024-05-28 DIAGNOSIS — L308 Other specified dermatitis: Secondary | ICD-10-CM | POA: Diagnosis not present

## 2024-06-18 DIAGNOSIS — L280 Lichen simplex chronicus: Secondary | ICD-10-CM | POA: Diagnosis not present

## 2024-06-18 DIAGNOSIS — L300 Nummular dermatitis: Secondary | ICD-10-CM | POA: Diagnosis not present

## 2024-06-18 DIAGNOSIS — L308 Other specified dermatitis: Secondary | ICD-10-CM | POA: Diagnosis not present

## 2024-06-18 DIAGNOSIS — L309 Dermatitis, unspecified: Secondary | ICD-10-CM | POA: Diagnosis not present

## 2024-07-10 DIAGNOSIS — H524 Presbyopia: Secondary | ICD-10-CM | POA: Diagnosis not present

## 2024-07-10 DIAGNOSIS — H2513 Age-related nuclear cataract, bilateral: Secondary | ICD-10-CM | POA: Diagnosis not present

## 2024-07-10 DIAGNOSIS — H40053 Ocular hypertension, bilateral: Secondary | ICD-10-CM | POA: Diagnosis not present

## 2024-07-10 DIAGNOSIS — H35363 Drusen (degenerative) of macula, bilateral: Secondary | ICD-10-CM | POA: Diagnosis not present

## 2024-07-10 DIAGNOSIS — H52223 Regular astigmatism, bilateral: Secondary | ICD-10-CM | POA: Diagnosis not present

## 2024-07-10 DIAGNOSIS — H5203 Hypermetropia, bilateral: Secondary | ICD-10-CM | POA: Diagnosis not present

## 2024-07-16 DIAGNOSIS — L309 Dermatitis, unspecified: Secondary | ICD-10-CM | POA: Diagnosis not present

## 2024-07-16 DIAGNOSIS — L280 Lichen simplex chronicus: Secondary | ICD-10-CM | POA: Diagnosis not present
